# Patient Record
Sex: Female | Born: 1980 | Race: White | Hispanic: No | Marital: Single | Smoking: Former smoker
Health system: Southern US, Community
[De-identification: ages and names within clinical notes are randomized; demographics above are authoritative.]

## PROBLEM LIST (undated history)

## (undated) ENCOUNTER — Inpatient Hospital Stay: Admission: EM | Payer: Self-pay | Source: Home / Self Care

## (undated) DIAGNOSIS — F1910 Other psychoactive substance abuse, uncomplicated: Secondary | ICD-10-CM

## (undated) DIAGNOSIS — F419 Anxiety disorder, unspecified: Secondary | ICD-10-CM

## (undated) DIAGNOSIS — Z7289 Other problems related to lifestyle: Secondary | ICD-10-CM

## (undated) DIAGNOSIS — IMO0002 Reserved for concepts with insufficient information to code with codable children: Secondary | ICD-10-CM

## (undated) DIAGNOSIS — B009 Herpesviral infection, unspecified: Secondary | ICD-10-CM

## (undated) DIAGNOSIS — Z8639 Personal history of other endocrine, nutritional and metabolic disease: Secondary | ICD-10-CM

## (undated) DIAGNOSIS — Z21 Asymptomatic human immunodeficiency virus [HIV] infection status: Secondary | ICD-10-CM

## (undated) DIAGNOSIS — F319 Bipolar disorder, unspecified: Secondary | ICD-10-CM

## (undated) DIAGNOSIS — B20 Human immunodeficiency virus [HIV] disease: Secondary | ICD-10-CM

## (undated) DIAGNOSIS — M5412 Radiculopathy, cervical region: Secondary | ICD-10-CM

## (undated) HISTORY — DX: Radiculopathy, cervical region: M54.12

## (undated) HISTORY — DX: Personal history of other endocrine, nutritional and metabolic disease: Z86.39

## (undated) HISTORY — DX: Human immunodeficiency virus (HIV) disease: B20

## (undated) HISTORY — PX: WISDOM TOOTH EXTRACTION: SHX21

## (undated) HISTORY — PX: CHOLECYSTECTOMY: SHX55

## (undated) HISTORY — DX: Bipolar disorder, unspecified: F31.9

## (undated) HISTORY — DX: Other problems related to lifestyle: Z72.89

## (undated) HISTORY — DX: Other psychoactive substance abuse, uncomplicated: F19.10

## (undated) HISTORY — DX: Herpesviral infection, unspecified: B00.9

## (undated) HISTORY — DX: Anxiety disorder, unspecified: F41.9

## (undated) HISTORY — DX: Asymptomatic human immunodeficiency virus (hiv) infection status: Z21

## (undated) HISTORY — DX: Reserved for concepts with insufficient information to code with codable children: IMO0002

---
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
## 1998-01-11 ENCOUNTER — Emergency Department (HOSPITAL_COMMUNITY): Admission: EM | Admit: 1998-01-11 | Discharge: 1998-01-11 | Payer: Self-pay | Admitting: Emergency Medicine

## 1999-02-23 ENCOUNTER — Emergency Department (HOSPITAL_COMMUNITY): Admission: EM | Admit: 1999-02-23 | Discharge: 1999-02-23 | Payer: Self-pay | Admitting: Emergency Medicine

## 1999-10-11 ENCOUNTER — Encounter: Payer: Self-pay | Admitting: Emergency Medicine

## 1999-10-11 ENCOUNTER — Emergency Department (HOSPITAL_COMMUNITY): Admission: EM | Admit: 1999-10-11 | Discharge: 1999-10-11 | Payer: Self-pay | Admitting: Emergency Medicine

## 2000-02-09 ENCOUNTER — Emergency Department (HOSPITAL_COMMUNITY): Admission: EM | Admit: 2000-02-09 | Discharge: 2000-02-09 | Payer: Self-pay | Admitting: *Deleted

## 2000-06-12 ENCOUNTER — Emergency Department (HOSPITAL_COMMUNITY): Admission: EM | Admit: 2000-06-12 | Discharge: 2000-06-12 | Payer: Self-pay | Admitting: Emergency Medicine

## 2000-06-15 ENCOUNTER — Inpatient Hospital Stay (HOSPITAL_COMMUNITY): Admission: EM | Admit: 2000-06-15 | Discharge: 2000-06-17 | Payer: Self-pay | Admitting: Emergency Medicine

## 2000-07-31 ENCOUNTER — Encounter: Payer: Self-pay | Admitting: Emergency Medicine

## 2000-07-31 ENCOUNTER — Emergency Department (HOSPITAL_COMMUNITY): Admission: EM | Admit: 2000-07-31 | Discharge: 2000-08-01 | Payer: Self-pay

## 2000-09-29 ENCOUNTER — Emergency Department (HOSPITAL_COMMUNITY): Admission: EM | Admit: 2000-09-29 | Discharge: 2000-09-29 | Payer: Self-pay | Admitting: Emergency Medicine

## 2000-10-26 ENCOUNTER — Emergency Department (HOSPITAL_COMMUNITY): Admission: EM | Admit: 2000-10-26 | Discharge: 2000-10-26 | Payer: Self-pay | Admitting: Emergency Medicine

## 2001-06-08 ENCOUNTER — Emergency Department (HOSPITAL_COMMUNITY): Admission: EM | Admit: 2001-06-08 | Discharge: 2001-06-08 | Payer: Self-pay | Admitting: Emergency Medicine

## 2001-06-08 ENCOUNTER — Encounter: Payer: Self-pay | Admitting: Emergency Medicine

## 2002-08-14 ENCOUNTER — Emergency Department (HOSPITAL_COMMUNITY): Admission: EM | Admit: 2002-08-14 | Discharge: 2002-08-14 | Payer: Self-pay | Admitting: Emergency Medicine

## 2003-02-16 ENCOUNTER — Ambulatory Visit (HOSPITAL_COMMUNITY): Admission: RE | Admit: 2003-02-16 | Discharge: 2003-02-16 | Payer: Self-pay | Admitting: Family Medicine

## 2003-03-07 ENCOUNTER — Ambulatory Visit (HOSPITAL_COMMUNITY): Admission: RE | Admit: 2003-03-07 | Discharge: 2003-03-07 | Payer: Self-pay | Admitting: Family Medicine

## 2003-04-14 ENCOUNTER — Emergency Department (HOSPITAL_COMMUNITY): Admission: EM | Admit: 2003-04-14 | Discharge: 2003-04-14 | Payer: Self-pay | Admitting: Emergency Medicine

## 2003-05-11 ENCOUNTER — Ambulatory Visit (HOSPITAL_COMMUNITY): Admission: RE | Admit: 2003-05-11 | Discharge: 2003-05-11 | Payer: Self-pay | Admitting: Orthopedic Surgery

## 2003-08-21 ENCOUNTER — Ambulatory Visit (HOSPITAL_COMMUNITY): Admission: RE | Admit: 2003-08-21 | Discharge: 2003-08-21 | Payer: Self-pay | Admitting: Family Medicine

## 2003-09-07 ENCOUNTER — Encounter (INDEPENDENT_AMBULATORY_CARE_PROVIDER_SITE_OTHER): Payer: Self-pay | Admitting: *Deleted

## 2003-09-07 ENCOUNTER — Observation Stay (HOSPITAL_COMMUNITY): Admission: RE | Admit: 2003-09-07 | Discharge: 2003-09-08 | Payer: Self-pay | Admitting: Surgery

## 2003-11-07 ENCOUNTER — Ambulatory Visit: Payer: Self-pay | Admitting: Family Medicine

## 2004-05-02 ENCOUNTER — Other Ambulatory Visit: Admission: RE | Admit: 2004-05-02 | Discharge: 2004-05-02 | Payer: Self-pay | Admitting: Obstetrics and Gynecology

## 2004-06-06 ENCOUNTER — Other Ambulatory Visit: Admission: RE | Admit: 2004-06-06 | Discharge: 2004-06-06 | Payer: Self-pay | Admitting: Obstetrics and Gynecology

## 2004-06-06 HISTORY — PX: LEEP: SHX91

## 2004-09-19 ENCOUNTER — Other Ambulatory Visit: Admission: RE | Admit: 2004-09-19 | Discharge: 2004-09-19 | Payer: Self-pay | Admitting: Obstetrics and Gynecology

## 2004-10-19 ENCOUNTER — Emergency Department (HOSPITAL_COMMUNITY): Admission: EM | Admit: 2004-10-19 | Discharge: 2004-10-19 | Payer: Self-pay | Admitting: Emergency Medicine

## 2004-10-22 ENCOUNTER — Emergency Department (HOSPITAL_COMMUNITY): Admission: EM | Admit: 2004-10-22 | Discharge: 2004-10-22 | Payer: Self-pay | Admitting: Emergency Medicine

## 2004-11-08 ENCOUNTER — Emergency Department (HOSPITAL_COMMUNITY): Admission: EM | Admit: 2004-11-08 | Discharge: 2004-11-08 | Payer: Self-pay | Admitting: Emergency Medicine

## 2005-01-17 ENCOUNTER — Emergency Department (HOSPITAL_COMMUNITY): Admission: EM | Admit: 2005-01-17 | Discharge: 2005-01-17 | Payer: Self-pay | Admitting: Emergency Medicine

## 2005-02-15 ENCOUNTER — Emergency Department (HOSPITAL_COMMUNITY): Admission: EM | Admit: 2005-02-15 | Discharge: 2005-02-15 | Payer: Self-pay | Admitting: Emergency Medicine

## 2005-03-10 ENCOUNTER — Ambulatory Visit: Payer: Self-pay | Admitting: Physical Medicine & Rehabilitation

## 2005-03-10 ENCOUNTER — Encounter
Admission: RE | Admit: 2005-03-10 | Discharge: 2005-06-08 | Payer: Self-pay | Admitting: Physical Medicine & Rehabilitation

## 2005-04-04 ENCOUNTER — Other Ambulatory Visit: Admission: RE | Admit: 2005-04-04 | Discharge: 2005-04-04 | Payer: Self-pay | Admitting: Obstetrics and Gynecology

## 2005-04-14 ENCOUNTER — Encounter: Admission: RE | Admit: 2005-04-14 | Discharge: 2005-04-14 | Payer: Self-pay | Admitting: Obstetrics and Gynecology

## 2005-04-21 ENCOUNTER — Ambulatory Visit: Payer: Self-pay | Admitting: Physical Medicine & Rehabilitation

## 2005-05-23 ENCOUNTER — Emergency Department (HOSPITAL_COMMUNITY): Admission: EM | Admit: 2005-05-23 | Discharge: 2005-05-23 | Payer: Self-pay | Admitting: Emergency Medicine

## 2005-05-24 ENCOUNTER — Emergency Department (HOSPITAL_COMMUNITY): Admission: EM | Admit: 2005-05-24 | Discharge: 2005-05-24 | Payer: Self-pay | Admitting: Emergency Medicine

## 2005-05-28 ENCOUNTER — Ambulatory Visit: Payer: Self-pay | Admitting: *Deleted

## 2005-05-28 ENCOUNTER — Inpatient Hospital Stay (HOSPITAL_COMMUNITY): Admission: RE | Admit: 2005-05-28 | Discharge: 2005-05-30 | Payer: Self-pay | Admitting: *Deleted

## 2005-06-11 ENCOUNTER — Emergency Department (HOSPITAL_COMMUNITY): Admission: EM | Admit: 2005-06-11 | Discharge: 2005-06-11 | Payer: Self-pay | Admitting: Emergency Medicine

## 2005-06-11 ENCOUNTER — Encounter: Payer: Self-pay | Admitting: Emergency Medicine

## 2005-06-12 ENCOUNTER — Emergency Department (HOSPITAL_COMMUNITY): Admission: EM | Admit: 2005-06-12 | Discharge: 2005-06-12 | Payer: Self-pay | Admitting: Emergency Medicine

## 2005-06-23 ENCOUNTER — Ambulatory Visit: Payer: Self-pay | Admitting: Physical Medicine & Rehabilitation

## 2005-06-23 ENCOUNTER — Other Ambulatory Visit (HOSPITAL_COMMUNITY): Admission: RE | Admit: 2005-06-23 | Discharge: 2005-07-08 | Payer: Self-pay | Admitting: Psychiatry

## 2005-06-23 ENCOUNTER — Encounter
Admission: RE | Admit: 2005-06-23 | Discharge: 2005-09-21 | Payer: Self-pay | Admitting: Physical Medicine & Rehabilitation

## 2005-07-06 ENCOUNTER — Emergency Department (HOSPITAL_COMMUNITY): Admission: EM | Admit: 2005-07-06 | Discharge: 2005-07-06 | Payer: Self-pay | Admitting: Emergency Medicine

## 2005-07-16 ENCOUNTER — Ambulatory Visit: Payer: Self-pay | Admitting: Physical Medicine & Rehabilitation

## 2005-07-24 ENCOUNTER — Ambulatory Visit: Payer: Self-pay | Admitting: Physical Medicine & Rehabilitation

## 2005-09-29 ENCOUNTER — Encounter
Admission: RE | Admit: 2005-09-29 | Discharge: 2005-12-28 | Payer: Self-pay | Admitting: Physical Medicine & Rehabilitation

## 2005-10-06 ENCOUNTER — Other Ambulatory Visit: Admission: RE | Admit: 2005-10-06 | Discharge: 2005-10-06 | Payer: Self-pay | Admitting: Obstetrics and Gynecology

## 2005-10-20 ENCOUNTER — Ambulatory Visit: Payer: Self-pay | Admitting: Physical Medicine & Rehabilitation

## 2005-10-22 ENCOUNTER — Ambulatory Visit (HOSPITAL_COMMUNITY)
Admission: RE | Admit: 2005-10-22 | Discharge: 2005-10-22 | Payer: Self-pay | Admitting: Physical Medicine & Rehabilitation

## 2005-10-31 ENCOUNTER — Encounter: Payer: Self-pay | Admitting: Gastroenterology

## 2005-11-21 ENCOUNTER — Ambulatory Visit: Payer: Self-pay | Admitting: Endocrinology

## 2006-02-06 ENCOUNTER — Ambulatory Visit: Payer: Self-pay | Admitting: Endocrinology

## 2006-10-08 ENCOUNTER — Encounter: Payer: Self-pay | Admitting: Endocrinology

## 2006-10-08 DIAGNOSIS — D352 Benign neoplasm of pituitary gland: Secondary | ICD-10-CM | POA: Insufficient documentation

## 2006-10-08 DIAGNOSIS — E0590 Thyrotoxicosis, unspecified without thyrotoxic crisis or storm: Secondary | ICD-10-CM | POA: Insufficient documentation

## 2006-10-08 DIAGNOSIS — D353 Benign neoplasm of craniopharyngeal duct: Secondary | ICD-10-CM

## 2007-05-16 ENCOUNTER — Emergency Department (HOSPITAL_COMMUNITY): Admission: EM | Admit: 2007-05-16 | Discharge: 2007-05-17 | Payer: Self-pay | Admitting: Emergency Medicine

## 2007-05-24 ENCOUNTER — Inpatient Hospital Stay (HOSPITAL_COMMUNITY): Admission: EM | Admit: 2007-05-24 | Discharge: 2007-05-25 | Payer: Self-pay | Admitting: Emergency Medicine

## 2007-05-25 ENCOUNTER — Encounter: Payer: Self-pay | Admitting: Internal Medicine

## 2007-06-01 ENCOUNTER — Ambulatory Visit: Payer: Self-pay | Admitting: Endocrinology

## 2007-06-01 DIAGNOSIS — E221 Hyperprolactinemia: Secondary | ICD-10-CM

## 2008-01-11 ENCOUNTER — Emergency Department (HOSPITAL_COMMUNITY): Admission: EM | Admit: 2008-01-11 | Discharge: 2008-01-11 | Payer: Self-pay | Admitting: Emergency Medicine

## 2009-11-12 ENCOUNTER — Other Ambulatory Visit: Admission: RE | Admit: 2009-11-12 | Discharge: 2009-11-12 | Payer: Self-pay | Admitting: Family Medicine

## 2009-12-17 ENCOUNTER — Other Ambulatory Visit: Payer: Self-pay | Admitting: Emergency Medicine

## 2009-12-17 ENCOUNTER — Inpatient Hospital Stay (HOSPITAL_COMMUNITY): Admission: EM | Admit: 2009-12-17 | Discharge: 2009-12-20 | Payer: Self-pay | Admitting: Psychiatry

## 2009-12-17 DIAGNOSIS — F3112 Bipolar disorder, current episode manic without psychotic features, moderate: Secondary | ICD-10-CM

## 2010-03-03 ENCOUNTER — Encounter: Payer: Self-pay | Admitting: Internal Medicine

## 2010-04-23 LAB — TSH: TSH: 2.442 u[IU]/mL (ref 0.350–4.500)

## 2010-04-23 LAB — URINALYSIS, ROUTINE W REFLEX MICROSCOPIC
Glucose, UA: NEGATIVE mg/dL
Hgb urine dipstick: NEGATIVE
Ketones, ur: NEGATIVE mg/dL
Protein, ur: NEGATIVE mg/dL
pH: 7 (ref 5.0–8.0)

## 2010-04-24 LAB — COMPREHENSIVE METABOLIC PANEL
Albumin: 3.2 g/dL — ABNORMAL LOW (ref 3.5–5.2)
Alkaline Phosphatase: 57 U/L (ref 39–117)
BUN: 11 mg/dL (ref 6–23)
CO2: 26 mEq/L (ref 19–32)
Chloride: 104 mEq/L (ref 96–112)
Creatinine, Ser: 1.16 mg/dL (ref 0.4–1.2)
GFR calc non Af Amer: 54 mL/min — ABNORMAL LOW (ref >60)
Glucose, Bld: 103 mg/dL — ABNORMAL HIGH (ref 70–99)
Potassium: 4.2 mEq/L (ref 3.5–5.1)
Total Bilirubin: 0.4 mg/dL (ref 0.3–1.2)

## 2010-04-24 LAB — CBC
HCT: 33.5 % — ABNORMAL LOW (ref 36.0–46.0)
Hemoglobin: 11.1 g/dL — ABNORMAL LOW (ref 12.0–15.0)
MCH: 29.7 pg (ref 26.0–34.0)
MCV: 89.3 fL (ref 78.0–100.0)
Platelets: 350 10*3/uL (ref 150–400)
RBC: 3.75 MIL/uL — ABNORMAL LOW (ref 3.87–5.11)
WBC: 8.4 10*3/uL (ref 4.0–10.5)

## 2010-04-24 LAB — URINALYSIS, ROUTINE W REFLEX MICROSCOPIC
Hgb urine dipstick: NEGATIVE
Protein, ur: NEGATIVE mg/dL
Specific Gravity, Urine: 1.01 (ref 1.005–1.030)
Urobilinogen, UA: 0.2 mg/dL (ref 0.0–1.0)

## 2010-04-24 LAB — DIFFERENTIAL
Basophils Absolute: 0.1 10*3/uL (ref 0.0–0.1)
Basophils Relative: 1 % (ref 0–1)
Lymphocytes Relative: 33 % (ref 12–46)
Monocytes Absolute: 0.4 10*3/uL (ref 0.1–1.0)
Neutro Abs: 4.9 10*3/uL (ref 1.7–7.7)
Neutrophils Relative %: 59 % (ref 43–77)

## 2010-04-24 LAB — RAPID URINE DRUG SCREEN, HOSP PERFORMED
Amphetamines: NOT DETECTED
Barbiturates: NOT DETECTED
Opiates: NOT DETECTED

## 2010-06-25 NOTE — H&P (Signed)
NAME:  Yolanda Jones, Yolanda Jones             ACCOUNT NO.:  [id number]   MEDICAL RECORD NO.:  [id number]          PATIENT TYPE:  INP   LOCATION:  0108                         FACILITY:  [facility]   PHYSICIAN:  Michiel Cowboy, MDDATE OF BIRTH:  1980-04-20   DATE OF ADMISSION:  05/24/2007  DATE OF DISCHARGE:                              HISTORY & PHYSICAL   PRIMARY CARE PHYSICIAN:  Lia Hopping, M.D.   ENDOCRINOLOGIST:  Cleophas Dunker. Everardo All, M.D.   CHIEF COMPLAINT:  Lightheadedness and syncope.   HISTORY OF PRESENT ILLNESS:  The patient is a 30 year old female with  history of fibromyalgia, bipolar disorder, chronic back pain, and  pituitary adenoma treated by Dr. Everardo All for which she is on  bromocriptine.  The patient reports for the past two weeks, she has been  progressively more and more lightheaded, especially when she stands up.  She also endorses increased urination with increased volume of urine  produced.  Also she feels that she has gained some extra weight.  The  patient was seen for this one week ago in the emergency department and  was given IV fluids and sent home and continues to have similar symptoms  which she feels are currently worse.  She feels that she is lightheaded  every time she stands up.  Otherwise denies chest pain or shortness of  breath.  She denies fevers or chills.  No lower extremity swelling.   REVIEW OF SYSTEMS:  Otherwise negative except for as per HPI.  The  patient endorses daily headaches, but denies double vision.   PAST MEDICAL HISTORY:  Significant for pituitary adenoma, bipolar  disorder, chronic back pain, chronic insomnia, ADHD.  Asthma.   SOCIAL HISTORY:  The patient is disabled.  Denies drug abuse, but in  records may have had prescription narcotic abuse in the past.  Currently  smokes and drinks occasionally.   ALLERGIES:  DAYPRO, MOBIC.   MEDICATIONS:  The patient is unclear about her dosages and will have her  friend bring her medications  tomorrow, but to the best of her knowledge  she takes:  1. Clonazepam which she is prescribed to do 1 mg three times a day,      but she only takes it occasionally when she feels she needs it.  2. Lamictal 200 mg p.o. once a day.  3. Zyprexa once a day.  The patient is unsure of dose.  4. Seroquel 200 mg p.o. once a day.  5. Bromocriptine the patient is unsure of dose once a day, but per      records it is 2.5 mg p.o. once a day.  6. Adoral XR 20 mg p.o. once a day.  The patient does not take this      regularly.  She takes it whenever she feels she needs it.   FAMILY HISTORY:  The patient is adopted.   PHYSICAL EXAMINATION:  VITAL SIGNS:  Temperature 98.3, respirations 18,  heart rate 107, blood pressure 114/77, saturating 99% on room air.  GENERAL:  The patient is obese, but in no acute distress.  She is laying  down comfortably  on a stretcher.  HEENT:  Moist mucous membranes.  HEART:  Regular rate and rhythm with no murmurs, rubs, or gallops.  LUNGS:  Clear to auscultation bilaterally.  ABDOMEN:  Obese, nondistended, and nontender.  EXTREMITIES:  Lower extremities without edema.  SKIN:  Significant for multiple old and new ulceration scars over upper  extremities.  NEUROLOGY:  Intact.   STUDIES:  White blood cell count 8.7, hemoglobin 12.2, platelets 358.  Sodium 140, potassium 4.2, creatinine 0.84.  LFTs within normal limits.  Urine pregnancy test is negative.  UA shows small leukocytes, 3 white  blood cells, and no bacteria.   MRI; the patient has not had MRI for about 2 years.  The last one was  obtained in March 2007 which showed a small pituitary adenoma 3 x 4 mm  and pituitary gland overall upper limits of normal between 7-8 mm.  Otherwise normal brain.  Again this scan was done in 2007.   ASSESSMENT:  This is a 30 year old female with past medical history of  pituitary adenoma who comes in with frequent lightheadedness.  1. Lightheadedness.  We will obtain  orthostatics.  We will give      hydration overnight and see if her orthostasis improves.  We will      obtain cortisol level and TSH level.  Given history of      prolactinoma, we will obtain a prolactin level and repeat MRI since      the patient is due for that.  Once the patient is hemodynamically      stable and orthostasis has resolved, we will have her follow up      with her endocrinologist sooner or later.  We will also, given the      frequency of urination, check urine osmolality and do strict I&Os      to keep an eye on amount of patient intake.  2. History of bipolar disorder.  We will continue her home medicines.      Right now we will continue presumed doses and we will have the      patient's friend bring the medicines in the morning and adjust her      medication as per her prescribed doses.  3. Prophylaxis.  SCDs and good diet.      Michiel Cowboy, MD  Electronically Signed     AVD/MEDQ  D:  05/25/2007  T:  05/25/2007  Job:  [id number]   cc:   Lia Hopping  Fax: [phone]   Sean A. Everardo All, MD  [address]  [place]  [place] [address]

## 2010-06-28 NOTE — Op Note (Signed)
NAME:  Yolanda Jones, Yolanda Jones                       ACCOUNT NO.:  [id number]   MEDICAL RECORD NO.:  [id number]                   PATIENT TYPE:  AMB   LOCATION:  DAY                                  FACILITY:  [facility]   PHYSICIAN:  Currie Paris, M.D.           DATE OF BIRTH:  07-01-1980   DATE OF PROCEDURE:  09/07/2003  DATE OF DISCHARGE:                                 OPERATIVE REPORT   CCS#:  [id number]   PREOPERATIVE DIAGNOSES:  Chronic calculous cholecystitis.   POSTOPERATIVE DIAGNOSES:  Chronic calculous cholecystitis.   OPERATION:  Laparoscopic cholecystectomy with operative cholangiogram.   SURGEON:  Currie Paris, M.D.   ASSISTANT:  Angelia Mould. Derrell Lolling, M.D.   ANESTHESIA:  General endotracheal.   CLINICAL COURSE:  This patient is a 30 year old with biliary type symptoms  and a finding of gallstones.   DESCRIPTION OF PROCEDURE:  The patient was seen in the holding area and had  no further questions. She was taken to the operating room and after  satisfactory general endotracheal anesthesia had been obtained, the abdomen  was prepped and draped. 0.25% plain Marcaine was used for each incision. The  umbilical incision was made, the fascia opened, and the peritoneal cavity  entered under direct vision.  A pursestring was placed, the Hasson  introduced and the abdomen insufflated to 15.   With the camera in, everything appeared unremarkable and the patient was  placed in reverse Trendelenburg position and tilted to the left. A 10/11  trocar was placed in the epigastrium and two 5 mm laterally on the right  side.   The gallbladder was retracted over the liver and the area of the triangle of  Calot dissected out with the peritoneum being opened. The cystic artery was  densely attached to the cystic duct, was dissected off, clipped and divided.  The cystic duct was then dissected out for a fairly good length and clipped  at the junction with the gallbladder.  It was opened  and a Cook catheter  introduced and operative cholangiography done which appeared to be normal.   The cystic duct catheter was removed and three clips placed on the stay side  of  the cystic duct. The gallbladder was then removed from below to above.  The posterior branch of the artery was clipped as well as another little  vessel higher up posteriorly as the gallbladder was removed. Once it was  disconnected, it was put in a bag. We irrigated and made sure everything was  dry and then brought the gallbladder out the umbilical port. The port was  occluded for a few minutes while we did a final irrigation and checked for  hemostasis and again there was no blood or bile  leak noted. The bladder ports were removed. The umbilical port site was  closed with a pursestring. The abdomen was deflated through the epigastric  port.  The skin was closed with 4-0 Monocryl  subcuticular and Dermabond.   The patient tolerated the procedure well. There were no operative  complications.  All counts were correct.                                               Currie Paris, M.D.    CJS/MEDQ  D:  09/07/2003  T:  09/07/2003  Job:  [id number]   cc:   Maurice March, M.D.  [address] [place]  [place] [address]  Fax: [phone]

## 2010-06-28 NOTE — Assessment & Plan Note (Signed)
Yolanda Jones is back regarding her fibromyalgia and low back pain.  We got an  MRI of her lumbar spine which was done on October 22, 2005.  There was  residual disc desiccation at L5-S1 with a minimal posterior annular tear.  There appeared to be no obvious change from the last exam in March 2005.  No  obvious facet arthropathy was seen.  I reviewed these films by hand.  The  patient complains of similar pain today in the low back with some radiation  up to the neck.  She rates her pain as a 7 to 8 out of 10, described as  sharp, burning, intermittent, dull, and stabbing.  She uses Norco 10/325, 1-  2 q.6h. p.r.n. with some relief.  She states the Tresa Garter is not as helpful as  the Flexeril was previously.  She has not begun Lyrica due to insurance  coverage issues.  She has been using ibuprofen with some benefit.  She  states the pain is most prominent when she sits, however, upon further  questioning, she states that generally the pain is mostly an issue when she  rises to a standing position after sitting for awhile.  She will frequently  sit with her legs crossed and hips flexed while she is at the computer.  She  rarely stretches before getting up.  Walking and standing also exacerbates  her pain.  She states she has some discomfort with bending, as well.  There  is no radiation of the pain to her legs.  The pain seems to be the most  prominent at the base of her spine in a central distribution.  The pain  interferes with general activity, relation to others, and enjoyment of life  on a moderate level.   On review of systems, the patient reports tingling, spasms, dizziness,  occasional anxiety with depression, weight gain.  Other pertinent positives  as listed above, full review is in the health and history section.   SOCIAL HISTORY:  The patient is single and living with a roommate.   PHYSICAL EXAMINATION:  VITAL SIGNS:  Blood pressure 102/58, pulse 111, respiratory rate 16, she is  saturating at 99% on room air.  GENERAL:  The patient is pleasant in no apparent distress.  She appears to  have gained some weight.  HEART:  Regular rhythm and tachycardic.  LUNGS:  Clear.  ABDOMEN:  Soft, nontender.  Examination of the low back is notable for pain at the L5-S1 level along  with palpation of the facets and spinous process.  She is able to flex  almost touching her toes today with some discomfort.  She had much more  discomfort with extension and with some maneuvers to either side.  She had  pain at the L5-S1 level as well as the L4-L5 level with extension.  We  performed straight leg-testing which was negative.  Luisa Hart testing was  negative except for pain at the knees.  Seated slump test was negative  except for hamstring stretch tightness.  The patient sitting prone with the  back extended caused discomfort in the lumbar spine today.  Compression test  was negative on either side.  Motor function was 5/5 and sensory exam was  normal.  No focal trigger points were palpated today.   ASSESSMENT:  1. Fibromyalgia syndrome.  2. Questionable bilateral sacroiliac joint arthropathy.  3. Lumbar facet arthropathy despite lack of obvious appearance on her MRI.  4. Myofascial pain.  5. Insomnia.  6. Post  traumatic stress disorder/borderline personality.  7. Chronic fatigue.   PLAN:  1. Will set the patient up for lumbar facet blocks at L4-L5 and L5-S1.  I      think the L5-S1 level is the most significant contributor to her pain.  2. Change her to Flexeril 10 mg 1/2 to 1 q.8h. p.r.n.  3. Refill Norco 10/325, 1-2 q.6h. p.r.n., #140.  4. We stressed heavily the importance of regular stretching, particularly      with flexion exercises, every morning as well as while she is sitting      for prolonged periods of time.  She needs to break up her sitting into      20-30 minutes increments.  She needs to work on aerobic  activity such      as walking, bicycling, possibly water  therapies, etc.  5. Consider Lyrica trial.  6. I will see her back after facet blocks.      Ranelle Oyster, M.D.  Electronically Signed     ZTS/MedQ  D:  11/17/2005 14:55:19  T:  11/18/2005 23:12:38  Job #:  [id number]   cc:   Silas Sacramento, M.D.
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
## 2010-06-28 NOTE — Procedures (Signed)
NAME:  Yolanda Jones, Yolanda Jones             ACCOUNT NO.:  [id number]   MEDICAL RECORD NO.:  [id number]            PATIENT TYPE:   LOCATION:                                 FACILITY:   PHYSICIAN:  Erick Colace, M.D.DATE OF BIRTH:  [birth year]   DATE OF PROCEDURE:  DATE OF DISCHARGE:                                 OPERATIVE REPORT   OPERATION/PROCEDURE:  Bilateral sacroiliac joint injection under  fluoroscopic guidance.   DESCRIPTION OF PROCEDURE:  Informed consent was obtained after describing  the risks and benefits of the procedure to the patient.  These include  bleeding, bruising, infection, lower extremity weakness or numbness.  The  patient elects to proceed.   The patient was placed prone on the fluoroscopy table.  Betadine prepped and  sterilely draped.  A 25-gauge, 1-and-1/2-inch needle was used to anesthetize  the skin and subcutaneous tissues with 1% lidocaine x 2 mL at each site.  Then a 25-gauge, 3-inch spinal needle was inserted first at the left SI  joint under fluoroscopic guidance with AP and lateral imaging utilized.  Omnipaque 180 demonstrated good joint arthrogram, and a solution containing  1 mL of 40 mg/mL Depo-Medrol plus 1 mL of 2% MPF lidocaine was injected.  Next, the right sacroiliac joint was imaged with 25-gauge spinal needle  inserted into the right SI joint under fluoroscopic guidance.  Omnipaque 180  demonstrated joint arthrogram as well as a capsular leak inferiorly, and  then 1 mL of the Depo-Medrol/lidocaine solution was injected.  The patient  tolerated the procedure well.   Post-procedure instructions given.  The patient to follow up with Dr.  Riley Kill.      Erick Colace, M.D.  Electronically Signed     AEK/MEDQ  D:  07/17/2005 16:57:15  T:  07/18/2005 11:36:27  Job:  [id number]   cc:   Ranelle Oyster, M.D.  Fax: [phone]

## 2010-06-28 NOTE — Consult Note (Signed)
[facility]                          ENDOCRINOLOGY CONSULTATION   NAME:JONESJennifier, [name] Jones                    MRN:          [id number]  DATE:02/06/2006                            DOB:          05/16/1980    REFERRING PHYSICIAN:  Jonita Jones, M.D.   HISTORY OF PRESENT ILLNESS:  A 30 year old woman who went to see Dr.  Perrin Jones because she did not feel well. She had several months of slight  palpitations in the chest with some associated weight gain and twitching  of the left eye.   PAST MEDICAL HISTORY:  She has a history of a small prolactin producing  pituitary adenoma. She takes Parlodel 2.5 mg q.h.s. for this, and her  menstrual periods have returned.   SOCIAL HISTORY:  She is disabled, she is single.   FAMILY HISTORY:  She is adopted.   REVIEW OF SYSTEMS:  Denies tremor and palpitations.   PHYSICAL EXAMINATION:  VITAL SIGNS:  Blood pressure 105/73, heart rate  96, temperature is 97.4, the weight is 185.  GENERAL:  Obese, no distress.  SKIN:  Normal texture and temperature.  HEENT:  No proptosis, no periorbital swelling.  NECK:  I do not appreciate a goiter.  CHEST:  Clear to auscultation. No respiratory distress.  CARDIOVASCULAR:  No JVD, no edema. Regular rate and rhythm, no murmur.  MUSCULOSKELETAL:  Muscle bulk and strength normal in Jones fours.  NEUROLOGIC:  Alert and oriented, does not appear anxious nor depressed  and there is no tremor.   LABORATORY DATA:  Forwarded by Dr. Perrin Jones:  On January 26, 2006, TSH  0.24. FTI is normal at 1.4. It should be noted that her TSH was normal  at 2.71 with Dr. Su Jones on October 06, 2005.   IMPRESSION:  1. Mild hyperthyroidism of uncertain etiology. It appears to have      developed over the past few months. Subacute thyroiditis is a      possible cause of this.  2. Small prolactin-producing pituitary adenoma for which she is      clinically better on Parlodel.  3. Her weight gain may be due to  her Seroquel.   PLAN:  1. We discussed hyperthyroidism, differential diagnoses of its causes,      and its risks and I gave her a brochure.  2. Recheck thyroid function studies in 2 weeks.  3. Check prolactin then also.  4. Having 1 more thyroid function study reading may help to determine      its etiology.  5. When her prolactin normalizes, she will due for a repeat MRI to      confirm resolution of the small adenoma she had.     Yolanda A. Everardo All, MD  Electronically Signed    SAE/MedQ  DD: 02/08/2006  DT: 02/09/2006  Job #: [id number]   cc:   Yolanda Jones, M.D.  Yolanda Jones, M.D.  Yolanda Jones, M.D.

## 2010-10-22 ENCOUNTER — Other Ambulatory Visit (HOSPITAL_COMMUNITY): Payer: Self-pay | Admitting: Family Medicine

## 2010-10-22 DIAGNOSIS — M549 Dorsalgia, unspecified: Secondary | ICD-10-CM

## 2010-10-29 ENCOUNTER — Ambulatory Visit (HOSPITAL_COMMUNITY)
Admission: RE | Admit: 2010-10-29 | Discharge: 2010-10-29 | Disposition: A | Discharge status: Home / Self Care | Payer: Medicare Other | Source: Ambulatory Visit | Attending: Family Medicine | Admitting: Family Medicine

## 2010-10-29 DIAGNOSIS — M5126 Other intervertebral disc displacement, lumbar region: Secondary | ICD-10-CM | POA: Insufficient documentation

## 2010-10-29 DIAGNOSIS — M545 Low back pain, unspecified: Secondary | ICD-10-CM | POA: Insufficient documentation

## 2010-10-29 DIAGNOSIS — M549 Dorsalgia, unspecified: Secondary | ICD-10-CM

## 2010-11-05 LAB — BASIC METABOLIC PANEL
Chloride: 108
GFR calc non Af Amer: >60
Glucose, Bld: 101 — ABNORMAL HIGH
Potassium: 3.6
Sodium: 139

## 2010-11-05 LAB — DIFFERENTIAL
Basophils Absolute: 0
Basophils Relative: 1
Eosinophils Absolute: 0
Eosinophils Relative: 0
Lymphocytes Relative: 38
Lymphs Abs: 3.2
Monocytes Absolute: 0.4
Monocytes Relative: 5
Monocytes Relative: 6
Neutrophils Relative %: 55

## 2010-11-05 LAB — URINALYSIS, ROUTINE W REFLEX MICROSCOPIC
Bilirubin Urine: NEGATIVE
Nitrite: NEGATIVE
Specific Gravity, Urine: 1.006
pH: 6

## 2010-11-05 LAB — COMPREHENSIVE METABOLIC PANEL
AST: 19
Albumin: 3.3 — ABNORMAL LOW
Alkaline Phosphatase: 64
Chloride: 107
GFR calc Af Amer: >60
Potassium: 4.2
Sodium: 140
Total Bilirubin: 0.6
Total Protein: 6.4

## 2010-11-05 LAB — CBC
HCT: 38.4
Hemoglobin: 12.1
MCV: 88.8
Platelets: 358
RDW: 15.2
WBC: 8.3
WBC: 8.7

## 2010-11-05 LAB — PROLACTIN: Prolactin: 16.4

## 2010-11-05 LAB — PROTIME-INR
INR: 0.8
Prothrombin Time: 11.7

## 2010-11-05 LAB — FOLLICLE STIMULATING HORMONE: FSH: 5.6

## 2010-11-05 LAB — TSH: TSH: 1.355

## 2010-11-05 LAB — CK: Total CK: 69

## 2010-11-05 LAB — OSMOLALITY, URINE: Osmolality, Ur: 390

## 2010-11-05 LAB — HEMOGLOBIN A1C: Mean Plasma Glucose: 108

## 2010-11-05 LAB — URINE MICROSCOPIC-ADD ON

## 2010-11-05 LAB — PREGNANCY, URINE: Preg Test, Ur: NEGATIVE

## 2010-12-09 ENCOUNTER — Ambulatory Visit (INDEPENDENT_AMBULATORY_CARE_PROVIDER_SITE_OTHER): Payer: Medicare Other

## 2010-12-09 DIAGNOSIS — Z79891 Long term (current) use of opiate analgesic: Secondary | ICD-10-CM

## 2010-12-09 DIAGNOSIS — F3132 Bipolar disorder, current episode depressed, moderate: Secondary | ICD-10-CM

## 2010-12-09 DIAGNOSIS — F4310 Post-traumatic stress disorder, unspecified: Secondary | ICD-10-CM

## 2010-12-09 DIAGNOSIS — Z23 Encounter for immunization: Secondary | ICD-10-CM

## 2010-12-09 DIAGNOSIS — F411 Generalized anxiety disorder: Secondary | ICD-10-CM

## 2010-12-09 DIAGNOSIS — B009 Herpesviral infection, unspecified: Secondary | ICD-10-CM

## 2010-12-09 DIAGNOSIS — B20 Human immunodeficiency virus [HIV] disease: Secondary | ICD-10-CM

## 2010-12-09 DIAGNOSIS — Z113 Encounter for screening for infections with a predominantly sexual mode of transmission: Secondary | ICD-10-CM

## 2010-12-09 DIAGNOSIS — F609 Personality disorder, unspecified: Secondary | ICD-10-CM

## 2010-12-09 DIAGNOSIS — Z114 Encounter for screening for human immunodeficiency virus [HIV]: Secondary | ICD-10-CM

## 2010-12-09 DIAGNOSIS — Z79899 Other long term (current) drug therapy: Secondary | ICD-10-CM

## 2010-12-09 LAB — CBC WITH DIFFERENTIAL/PLATELET
Basophils Absolute: 0 10*3/uL (ref 0.0–0.1)
Basophils Relative: 0 % (ref 0–1)
HCT: 41.4 % (ref 36.0–46.0)
Hemoglobin: 13.6 g/dL (ref 12.0–15.0)
Lymphocytes Relative: 42 % (ref 12–46)
MCHC: 32.9 g/dL (ref 30.0–36.0)
Monocytes Absolute: 0.4 10*3/uL (ref 0.1–1.0)
Neutro Abs: 2.5 10*3/uL (ref 1.7–7.7)
Neutrophils Relative %: 44 % (ref 43–77)
RDW: 13.4 % (ref 11.5–15.5)
WBC: 5.8 10*3/uL (ref 4.0–10.5)

## 2010-12-09 LAB — URINALYSIS
Bilirubin Urine: NEGATIVE
Nitrite: NEGATIVE
Protein, ur: NEGATIVE mg/dL
Specific Gravity, Urine: 1.013 (ref 1.005–1.030)
Urobilinogen, UA: 0.2 mg/dL (ref 0.0–1.0)

## 2010-12-09 LAB — LIPID PANEL
Cholesterol: 181 mg/dL (ref 0–200)
Triglycerides: 56 mg/dL (ref <150)
VLDL: 11 mg/dL (ref 0–40)

## 2010-12-09 LAB — RPR

## 2010-12-10 LAB — GC/CHLAMYDIA PROBE AMP, URINE: Chlamydia, Swab/Urine, PCR: NEGATIVE

## 2010-12-10 LAB — COMPLETE METABOLIC PANEL WITH GFR
ALT: 12 U/L (ref 0–35)
AST: 19 U/L (ref 0–37)
Albumin: 4.2 g/dL (ref 3.5–5.2)
Alkaline Phosphatase: 53 U/L (ref 39–117)
BUN: 13 mg/dL (ref 6–23)
Calcium: 9.9 mg/dL (ref 8.4–10.5)
Chloride: 100 mEq/L (ref 96–112)
Potassium: 3.9 mEq/L (ref 3.5–5.3)
Sodium: 139 mEq/L (ref 135–145)
Total Protein: 7.5 g/dL (ref 6.0–8.3)

## 2010-12-10 LAB — HEPATITIS B CORE ANTIBODY, TOTAL: Hep B Core Total Ab: NEGATIVE

## 2010-12-10 LAB — HEPATITIS A ANTIBODY, TOTAL: Hep A Total Ab: NEGATIVE

## 2010-12-10 LAB — HEPATITIS C ANTIBODY: HCV Ab: NEGATIVE

## 2010-12-11 LAB — HIV-1 RNA ULTRAQUANT REFLEX TO GENTYP+: HIV-1 RNA Quant, Log: 4.29 {Log} — ABNORMAL HIGH (ref <1.30)

## 2010-12-12 LAB — HIV 1/2 CONFIRMATION
HIV-1 antibody: POSITIVE — AB
HIV-2 Ab: NEGATIVE

## 2010-12-13 DIAGNOSIS — F609 Personality disorder, unspecified: Secondary | ICD-10-CM | POA: Insufficient documentation

## 2010-12-13 DIAGNOSIS — B009 Herpesviral infection, unspecified: Secondary | ICD-10-CM | POA: Insufficient documentation

## 2010-12-13 DIAGNOSIS — B20 Human immunodeficiency virus [HIV] disease: Secondary | ICD-10-CM | POA: Insufficient documentation

## 2010-12-13 DIAGNOSIS — F3132 Bipolar disorder, current episode depressed, moderate: Secondary | ICD-10-CM | POA: Insufficient documentation

## 2010-12-13 DIAGNOSIS — F4310 Post-traumatic stress disorder, unspecified: Secondary | ICD-10-CM | POA: Insufficient documentation

## 2010-12-13 LAB — TB SKIN TEST
Induration: 0
TB Skin Test: NEGATIVE mm
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
## 2010-12-17 ENCOUNTER — Telehealth: Payer: Self-pay | Admitting: *Deleted

## 2010-12-17 LAB — HIV-1 GENOTYPR PLUS

## 2010-12-17 NOTE — Telephone Encounter (Signed)
Avelino Leeds from the health dept called to find out when pt's appt will be & her viral load & cd4 counts

## 2010-12-23 ENCOUNTER — Telehealth: Payer: Self-pay | Admitting: *Deleted

## 2010-12-23 ENCOUNTER — Ambulatory Visit (INDEPENDENT_AMBULATORY_CARE_PROVIDER_SITE_OTHER): Payer: Medicare Other | Admitting: Internal Medicine

## 2010-12-23 ENCOUNTER — Encounter: Payer: Self-pay | Admitting: Internal Medicine

## 2010-12-23 VITALS — BP 109/76 | HR 118 | Temp 98.6°F | Ht 60.0 in | Wt 155.0 lb

## 2010-12-23 DIAGNOSIS — B20 Human immunodeficiency virus [HIV] disease: Secondary | ICD-10-CM

## 2010-12-23 MED ORDER — ELVITEG-COBIC-EMTRICIT-TENOFDF 150-150-200-300 MG PO TABS: 1.0000 | ORAL_TABLET | Freq: Every day | ORAL | Status: DC

## 2010-12-23 NOTE — Telephone Encounter (Signed)
Called and left message for patient to return my call.  Stribild is not covered by her insurance and Dr. Luciana Axe wants to know if she is interested in trying Complera which she needs to take with food and can not take any antacids with this drug. Wendall Mola CMA
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
## 2010-12-23 NOTE — Assessment & Plan Note (Signed)
At this time the patient does have a CD4 count 980 and detectable viral load of 19,000. I have discussed treatment options with the patient and timing of treatment and do feel that despite no obvious indication for starting treatment I also do not find any indication to withhold treatment. She does state that she is ready to take treatment and she does take her other medications daily and does not miss doses. The treatment options I did discuss once daily treatment options which include the newer medications, however unfortunately Stribild does not get covered by her insurance. Therefore other options are Complera or boosted Prezista and Truvada. She has used PPIs and asked blockers in the past and I have discussed with her the inability to take those with Complera and so she is going to decide between the Complera and a boosted Prezista with Truvada. I will of course avoid Atripla with her psychiatric treatment history. I do no feel that she is ready for treatment and therefore will be prescribed once she has decided to. I did discuss using condoms for all sexual activity and that another way to avoid transmission to some degree is treatment and suppression of virus. She does have an IUD for birth control. She does get her Pap smear from her gynecologist. She will then have her viral load rechecked a month after starting medications and we'll see her back at that time.

## 2010-12-23 NOTE — Patient Instructions (Signed)
Return for labs in 4 weeks and appt in 6 weeks

## 2010-12-25 ENCOUNTER — Telehealth: Payer: Self-pay | Admitting: Internal Medicine

## 2010-12-25 NOTE — Telephone Encounter (Signed)
Patient called back to let us know she has been approved for Stribild.  She will call to make an appointment as soon as she receives her paperwork from [place].

## 2010-12-25 NOTE — Telephone Encounter (Signed)
Received call from [employer] Revonda Standard said she could not call patient about permission to take application over the phone.  Called Ms Liera, gave her Allison's name and number at [employer].  She is going to call her today.

## 2010-12-25 NOTE — Telephone Encounter (Signed)
Patient's Stribild was approved . Wendall Mola CMA

## 2010-12-26 ENCOUNTER — Other Ambulatory Visit: Payer: Self-pay | Admitting: *Deleted

## 2010-12-26 NOTE — Telephone Encounter (Signed)
There was a message on voice mail asking for the code so she could get the Stribild. I called her. She already had the code & is to get the Stribild today

## 2011-01-06 ENCOUNTER — Telehealth: Payer: Self-pay | Admitting: Internal Medicine

## 2011-01-06 NOTE — Telephone Encounter (Signed)
Patient called.  She will be here Wednesday 11-28 at 2:00pm to fill out her [facility] [employer] application.

## 2011-01-08 ENCOUNTER — Other Ambulatory Visit: Payer: Self-pay | Admitting: *Deleted

## 2011-01-08 DIAGNOSIS — B20 Human immunodeficiency virus [HIV] disease: Secondary | ICD-10-CM

## 2011-01-08 MED ORDER — ELVITEG-COBIC-EMTRICIT-TENOFDF 150-150-200-300 MG PO TABS: 1.0000 | ORAL_TABLET | Freq: Every day | ORAL | Status: DC

## 2011-01-13 ENCOUNTER — Telehealth: Payer: Self-pay | Admitting: Internal Medicine

## 2011-01-13 NOTE — Telephone Encounter (Signed)
Faxed and mailed application to [employer] for Stribild today.

## 2011-01-14 ENCOUNTER — Telehealth: Payer: Self-pay | Admitting: *Deleted

## 2011-01-14 NOTE — Telephone Encounter (Signed)
States she has a cold & is taking a OTC . Wanted to make sure the ingredients were ok with all the meds she is taking. Urged her to call her pharmacist & ask. She uses the same store so he/she will be able to tell her if there are any interactions. She agreed with this plan. States the OTC is making her feel better

## 2011-01-16 ENCOUNTER — Telehealth: Payer: Self-pay | Admitting: Internal Medicine

## 2011-01-16 NOTE — Telephone Encounter (Signed)
Received fax from [employer].  Page 3 of application did not go through.  Faxed back to them with page 1 and 3.

## 2011-01-20 ENCOUNTER — Other Ambulatory Visit: Payer: Medicare Other

## 2011-01-20 ENCOUNTER — Other Ambulatory Visit: Payer: Self-pay | Admitting: Internal Medicine

## 2011-01-20 DIAGNOSIS — B20 Human immunodeficiency virus [HIV] disease: Secondary | ICD-10-CM

## 2011-01-21 LAB — COMPLETE METABOLIC PANEL WITH GFR
ALT: 12 U/L (ref 0–35)
AST: 20 U/L (ref 0–37)
BUN: 13 mg/dL (ref 6–23)
CO2: 27 mEq/L (ref 19–32)
Calcium: 9.2 mg/dL (ref 8.4–10.5)
Chloride: 101 mEq/L (ref 96–112)
Creat: 1.07 mg/dL (ref 0.50–1.10)
GFR, Est African American: 85 mL/min
Total Bilirubin: 0.2 mg/dL — ABNORMAL LOW (ref 0.3–1.2)

## 2011-01-21 LAB — T-HELPER CELL (CD4) - (RCID CLINIC ONLY)
CD4 % Helper T Cell: 45 % (ref 33–55)
CD4 T Cell Abs: 970 uL (ref 400–2700)

## 2011-01-21 LAB — CBC WITH DIFFERENTIAL/PLATELET
Hemoglobin: 13.3 g/dL (ref 12.0–15.0)
Lymphocytes Relative: 32 % (ref 12–46)
Lymphs Abs: 2 10*3/uL (ref 0.7–4.0)
Monocytes Relative: 6 % (ref 3–12)
Neutrophils Relative %: 55 % (ref 43–77)
Platelets: 258 10*3/uL (ref 150–400)
RBC: 4.38 MIL/uL (ref 3.87–5.11)
WBC: 6.1 10*3/uL (ref 4.0–10.5)

## 2011-01-22 ENCOUNTER — Telehealth: Payer: Self-pay | Admitting: Internal Medicine

## 2011-01-22 LAB — HIV-1 RNA QUANT-NO REFLEX-BLD
HIV 1 RNA Quant: 27 copies/mL — ABNORMAL HIGH (ref <20)
HIV-1 RNA Quant, Log: 1.43 {Log} — ABNORMAL HIGH (ref <1.30)

## 2011-01-22 NOTE — Telephone Encounter (Signed)
Called [employer] concerning denial letter received from them.  Yolanda Jones is covered for the rest of 2012, but we will need to send a letter of medical necessity to Medicare for 2013.  Called [employer] to find out what the process is.  Need to have a letter of medical necessity from her doctor.  Can call back after 02-11-11 to get the process started.  She was sent a 90 day supply to get her through 2013. I will call Braileigh and let her know.

## 2011-01-30 ENCOUNTER — Encounter: Payer: Self-pay | Admitting: Licensed Clinical Social Worker

## 2011-01-30 ENCOUNTER — Encounter: Payer: Self-pay | Admitting: Internal Medicine

## 2011-01-30 ENCOUNTER — Ambulatory Visit (INDEPENDENT_AMBULATORY_CARE_PROVIDER_SITE_OTHER): Payer: Medicare Other | Admitting: Internal Medicine

## 2011-01-30 VITALS — BP 115/88 | HR 91 | Temp 98.0°F | Ht 60.0 in | Wt 159.5 lb

## 2011-01-30 DIAGNOSIS — B20 Human immunodeficiency virus [HIV] disease: Secondary | ICD-10-CM

## 2011-01-30 NOTE — Patient Instructions (Signed)
Follow up in 4 months 

## 2011-01-31 ENCOUNTER — Encounter: Payer: Self-pay | Admitting: Internal Medicine

## 2011-01-31 NOTE — Progress Notes (Signed)
  Subjective:    Patient ID: Yolanda Jones, female    DOB: 06/27/1990, 30 y.o.   MRN: [id number]  HPI 30 yo recently diagnosed with HIV and started on Stribild here for follow up.  She Has tolerated the medicine well and no issues.  She endorses 100% compliance and tolerance of the medication.  No recent hospitalizations.  She continues to get her gyn care by her gynecologist.      Review of Systems  Constitutional: Negative for fever, chills, appetite change, fatigue and unexpected weight change.  HENT: Negative for trouble swallowing.   Cardiovascular: Negative for leg swelling.  Gastrointestinal: Negative for abdominal pain and diarrhea.  Genitourinary: Negative for genital sores, menstrual problem and pelvic pain.  Musculoskeletal: Negative for myalgias and arthralgias.  Skin: Negative for rash.  Neurological: Negative for dizziness and headaches.  Hematological: Negative for adenopathy.  Psychiatric/Behavioral: Negative for behavioral problems, dysphoric mood and decreased concentration. The patient is not nervous/anxious.        Objective:   Physical Exam  Constitutional: She is oriented to person, place, and time. She appears well-developed and well-nourished. No distress.  HENT:  Mouth/Throat: Oropharynx is clear and moist. No oropharyngeal exudate.  Cardiovascular: Normal rate, regular rhythm and normal heart sounds.  Exam reveals no gallop and no friction rub.   No murmur heard. Pulmonary/Chest: Effort normal and breath sounds normal. No respiratory distress. She has no wheezes.  Abdominal: Soft. Bowel sounds are normal. She exhibits no distension. There is no tenderness.  Lymphadenopathy:    She has no cervical adenopathy.  Neurological: She is alert and oriented to person, place, and time.  Skin: Skin is warm and dry.  Psychiatric: Judgment and thought content normal.          Assessment & Plan:

## 2011-01-31 NOTE — Assessment & Plan Note (Signed)
She is doing very well and viral load nearly undetectable.  Patient is very pleased with the result.  She was reminded of condom use.  She will return in about 3 months for follow up and told to come in sooner if she has any other problems.  She is utd on immunizations and PAP.

## 2011-02-20 ENCOUNTER — Telehealth: Payer: Self-pay | Admitting: *Deleted

## 2011-02-20 NOTE — Telephone Encounter (Signed)
Faxed prior auth to [employer] for stibild. Form in red chart

## 2011-02-24 ENCOUNTER — Telehealth: Payer: Self-pay | Admitting: Internal Medicine

## 2011-02-24 NOTE — Telephone Encounter (Signed)
Received fax from [employer]   A prior authorization for Stribild is not necessary.  It is a covered drug by Medicare. I also called [employer] to see if this means Stribild is covered for all Medicare patients.  They are contacting Medicare to see if it has been added to the formulary of if it is just for Ms Karpowicz.  [employer] will contact me within the next two day and let me know what Medicare says.

## 2011-02-27 ENCOUNTER — Telehealth: Payer: Self-pay | Admitting: Internal Medicine

## 2011-02-27 NOTE — Telephone Encounter (Signed)
Received fax from [employer].  They need patient's consent to verify coverage.  Called Yolanda Jones.  Told her to call [employer] so they can do the conference call to Medicare.  She said she will call them this afternoon.

## 2011-05-19 ENCOUNTER — Other Ambulatory Visit: Payer: Medicare Other

## 2011-05-19 DIAGNOSIS — B20 Human immunodeficiency virus [HIV] disease: Secondary | ICD-10-CM

## 2011-05-20 LAB — COMPREHENSIVE METABOLIC PANEL
ALT: 13 U/L (ref 0–35)
AST: 17 U/L (ref 0–37)
Albumin: 4 g/dL (ref 3.5–5.2)
Alkaline Phosphatase: 63 U/L (ref 39–117)
Potassium: 4.4 mEq/L (ref 3.5–5.3)
Sodium: 139 mEq/L (ref 135–145)
Total Protein: 6.7 g/dL (ref 6.0–8.3)

## 2011-05-20 LAB — CBC WITH DIFFERENTIAL/PLATELET
Basophils Absolute: 0 10*3/uL (ref 0.0–0.1)
Eosinophils Relative: 0 % (ref 0–5)
HCT: 40.7 % (ref 36.0–46.0)
Lymphocytes Relative: 16 % (ref 12–46)
MCHC: 32.7 g/dL (ref 30.0–36.0)
MCV: 97.1 fL (ref 78.0–100.0)
Monocytes Absolute: 0.7 10*3/uL (ref 0.1–1.0)
RDW: 13.5 % (ref 11.5–15.5)
WBC: 8.2 10*3/uL (ref 4.0–10.5)

## 2011-06-02 ENCOUNTER — Encounter: Payer: Self-pay | Admitting: Internal Medicine

## 2011-06-02 ENCOUNTER — Ambulatory Visit (INDEPENDENT_AMBULATORY_CARE_PROVIDER_SITE_OTHER): Payer: Medicare Other | Admitting: Internal Medicine

## 2011-06-02 VITALS — BP 117/81 | HR 114 | Temp 98.5°F | Ht 60.0 in | Wt 175.0 lb

## 2011-06-02 DIAGNOSIS — B20 Human immunodeficiency virus [HIV] disease: Secondary | ICD-10-CM

## 2011-06-02 MED ORDER — ELVITEG-COBIC-EMTRICIT-TENOFDF 150-150-200-300 MG PO TABS: 1.0000 | ORAL_TABLET | Freq: Every day | ORAL | Status: DC

## 2011-06-02 NOTE — Assessment & Plan Note (Signed)
Excellent compliance.  No issues.  Received condoms.  Will do Tdap next visit (patient in a hurry today).  rtc 4 months.

## 2011-06-02 NOTE — Patient Instructions (Signed)
Keep it up!

## 2011-06-02 NOTE — Progress Notes (Signed)
  Subjective:    Patient ID: Yolanda Jones, female    DOB: 06/27/1990, 31 y.o.   MRN: [id number]  HPI here for follow up of HIV.  Continues to take Stribild with no missed doses.  Has had some fatigue and was afraid it was due to failing treatment after changing her schedule by a few hours.  No new issues.  Gets PAP from gyn and has a PCP.      Review of Systems  Constitutional: Positive for fatigue. Negative for fever, chills and unexpected weight change.  HENT: Negative for sore throat and trouble swallowing.   Respiratory: Negative for cough and shortness of breath.   Cardiovascular: Negative for chest pain, palpitations and leg swelling.  Gastrointestinal: Negative for nausea, abdominal pain and diarrhea.  Genitourinary: Negative for dysuria, difficulty urinating, genital sores and pelvic pain.  Musculoskeletal: Negative for myalgias, joint swelling and arthralgias.  Skin: Negative for rash.  Neurological: Negative for dizziness, weakness and headaches.  Psychiatric/Behavioral: Negative for dysphoric mood. The patient is not nervous/anxious.        Objective:   Physical Exam  Constitutional: She appears well-developed and well-nourished. No distress.  HENT:  Mouth/Throat: Oropharynx is clear and moist. No oropharyngeal exudate.  Cardiovascular: Normal rate, regular rhythm and normal heart sounds.  Exam reveals no gallop and no friction rub.   No murmur heard. Pulmonary/Chest: Effort normal and breath sounds normal. No respiratory distress. She has no wheezes. She has no rales.  Abdominal: Soft. Bowel sounds are normal. She exhibits no distension. There is no tenderness. There is no rebound.  Lymphadenopathy:    She has no cervical adenopathy.          Assessment & Plan:

## 2011-09-18 ENCOUNTER — Other Ambulatory Visit: Payer: Medicare Other

## 2011-09-18 DIAGNOSIS — B20 Human immunodeficiency virus [HIV] disease: Secondary | ICD-10-CM

## 2011-09-18 LAB — CBC WITH DIFFERENTIAL/PLATELET
Basophils Absolute: 0 10*3/uL (ref 0.0–0.1)
HCT: 36.9 % (ref 36.0–46.0)
Hemoglobin: 12.9 g/dL (ref 12.0–15.0)
Lymphocytes Relative: 34 % (ref 12–46)
Monocytes Absolute: 0.4 10*3/uL (ref 0.1–1.0)
Neutro Abs: 3.2 10*3/uL (ref 1.7–7.7)
RDW: 14.4 % (ref 11.5–15.5)
WBC: 5.6 10*3/uL (ref 4.0–10.5)

## 2011-09-19 LAB — COMPREHENSIVE METABOLIC PANEL
ALT: 12 U/L (ref 0–35)
BUN: 16 mg/dL (ref 6–23)
CO2: 27 mEq/L (ref 19–32)
Calcium: 9.5 mg/dL (ref 8.4–10.5)
Chloride: 104 mEq/L (ref 96–112)
Creat: 1.17 mg/dL — ABNORMAL HIGH (ref 0.50–1.10)
Glucose, Bld: 76 mg/dL (ref 70–99)

## 2011-09-19 LAB — T-HELPER CELL (CD4) - (RCID CLINIC ONLY): CD4 % Helper T Cell: 49 % (ref 33–55)

## 2011-10-02 ENCOUNTER — Ambulatory Visit: Payer: Medicare Other | Admitting: Internal Medicine

## 2011-10-21 ENCOUNTER — Ambulatory Visit (INDEPENDENT_AMBULATORY_CARE_PROVIDER_SITE_OTHER): Payer: Medicare Other | Admitting: Internal Medicine

## 2011-10-21 ENCOUNTER — Encounter: Payer: Self-pay | Admitting: Internal Medicine

## 2011-10-21 VITALS — BP 91/65 | HR 96 | Temp 98.0°F | Ht 60.0 in | Wt 182.0 lb

## 2011-10-21 DIAGNOSIS — B20 Human immunodeficiency virus [HIV] disease: Secondary | ICD-10-CM

## 2011-10-21 DIAGNOSIS — Z113 Encounter for screening for infections with a predominantly sexual mode of transmission: Secondary | ICD-10-CM

## 2011-10-21 NOTE — Progress Notes (Signed)
  Subjective:    Patient ID: Yolanda Jones, female    DOB: 06/27/1990, 31 y.o.   MRN: [id number]  HPI She comes in here for followup of her HIV. She continues to take Stribild and denies any missed doses. She feels well with the medication and has no complaints. She does struggle with anxiety and mental illness and continues to see a psychiatrist. She has her Pap smear from a gynecologist and does see PCP.   Review of Systems  Constitutional: Negative for fever, fatigue and unexpected weight change.  HENT: Negative for sore throat and trouble swallowing.   Respiratory: Negative for cough and shortness of breath.   Cardiovascular: Negative for leg swelling.  Gastrointestinal: Negative for nausea, abdominal pain and diarrhea.  Musculoskeletal: Negative for myalgias and arthralgias.  Neurological: Negative for dizziness, light-headedness and headaches.  Psychiatric/Behavioral: Negative for suicidal ideas. The patient is nervous/anxious.        Objective:   Physical Exam  Constitutional: She appears well-developed and well-nourished. No distress.  HENT:  Mouth/Throat: Oropharynx is clear and moist. No oropharyngeal exudate.  Cardiovascular: Normal rate, regular rhythm and normal heart sounds.  Exam reveals no gallop and no friction rub.   No murmur heard. Pulmonary/Chest: Effort normal and breath sounds normal. No respiratory distress. She has no wheezes. She has no rales.  Abdominal: Soft. Bowel sounds are normal. She exhibits no distension. There is no tenderness. There is no rebound.  Lymphadenopathy:    She has no cervical adenopathy.          Assessment & Plan:

## 2011-10-21 NOTE — Assessment & Plan Note (Signed)
She continues to do well on this regimen and has no issues. She was offered and given condoms and is to use condoms with all social activity. 2 return 4 months

## 2012-01-20 ENCOUNTER — Telehealth: Payer: Self-pay

## 2012-01-20 NOTE — Telephone Encounter (Signed)
LM for pharmacist that this RF + 1 is ok on Valtrex 500 mg tabs sig: take 1 tab q 12 hr x 3 days # 6 . Pt is overdue for AEX and should call for appt. Melody Comas A

## 2012-02-09 ENCOUNTER — Other Ambulatory Visit (INDEPENDENT_AMBULATORY_CARE_PROVIDER_SITE_OTHER): Payer: Medicare Other

## 2012-02-09 DIAGNOSIS — Z113 Encounter for screening for infections with a predominantly sexual mode of transmission: Secondary | ICD-10-CM

## 2012-02-09 DIAGNOSIS — B20 Human immunodeficiency virus [HIV] disease: Secondary | ICD-10-CM

## 2012-02-09 DIAGNOSIS — Z79899 Other long term (current) drug therapy: Secondary | ICD-10-CM

## 2012-02-09 LAB — CBC WITH DIFFERENTIAL/PLATELET
Eosinophils Relative: 5 % (ref 0–5)
HCT: 35.7 % — ABNORMAL LOW (ref 36.0–46.0)
Lymphocytes Relative: 25 % (ref 12–46)
Lymphs Abs: 1.4 10*3/uL (ref 0.7–4.0)
MCV: 92.2 fL (ref 78.0–100.0)
Monocytes Absolute: 0.5 10*3/uL (ref 0.1–1.0)
RBC: 3.87 MIL/uL (ref 3.87–5.11)
RDW: 14.2 % (ref 11.5–15.5)
WBC: 5.7 10*3/uL (ref 4.0–10.5)

## 2012-02-10 LAB — COMPLETE METABOLIC PANEL WITH GFR
BUN: 10 mg/dL (ref 6–23)
CO2: 27 mEq/L (ref 19–32)
Calcium: 9.3 mg/dL (ref 8.4–10.5)
Chloride: 106 mEq/L (ref 96–112)
Creat: 1.06 mg/dL (ref 0.50–1.10)
GFR, Est African American: 86 mL/min
Glucose, Bld: 80 mg/dL (ref 70–99)

## 2012-02-10 LAB — RPR

## 2012-02-10 LAB — T-HELPER CELL (CD4) - (RCID CLINIC ONLY): CD4 T Cell Abs: 720 uL (ref 400–2700)

## 2012-02-16 ENCOUNTER — Ambulatory Visit: Payer: Self-pay | Admitting: Obstetrics and Gynecology

## 2012-02-19 ENCOUNTER — Ambulatory Visit: Payer: Medicare Other | Admitting: Internal Medicine

## 2012-02-19 ENCOUNTER — Telehealth: Payer: Self-pay | Admitting: *Deleted

## 2012-02-19 NOTE — Telephone Encounter (Signed)
Called and left patient a voice mail to call the clinic to reschedule appt, she no showed today. Wendall Mola

## 2012-03-05 ENCOUNTER — Telehealth: Payer: Self-pay | Admitting: Obstetrics and Gynecology

## 2012-03-05 NOTE — Telephone Encounter (Signed)
Pt called back regarding her message. There is no documentation of anyone calling her Pt cancelled AEX for 03-08-12 w/AR  Pt may have received a cancellation call  LM for pt call the office to R/S AEX through our appointment dept.  [facility] CMA

## 2012-03-08 ENCOUNTER — Ambulatory Visit: Payer: Self-pay | Admitting: Obstetrics and Gynecology

## 2012-03-16 ENCOUNTER — Encounter: Payer: Self-pay | Admitting: Internal Medicine

## 2012-03-16 ENCOUNTER — Ambulatory Visit (INDEPENDENT_AMBULATORY_CARE_PROVIDER_SITE_OTHER): Payer: Medicare Other | Admitting: Internal Medicine

## 2012-03-16 VITALS — BP 107/77 | HR 97 | Temp 97.8°F | Ht 60.0 in | Wt 193.0 lb

## 2012-03-16 DIAGNOSIS — Z79899 Other long term (current) drug therapy: Secondary | ICD-10-CM

## 2012-03-16 DIAGNOSIS — Z113 Encounter for screening for infections with a predominantly sexual mode of transmission: Secondary | ICD-10-CM

## 2012-03-16 DIAGNOSIS — B20 Human immunodeficiency virus [HIV] disease: Secondary | ICD-10-CM

## 2012-03-16 DIAGNOSIS — R635 Abnormal weight gain: Secondary | ICD-10-CM

## 2012-03-16 NOTE — Progress Notes (Signed)
  Subjective:    Patient ID: Yolanda Jones, female    DOB: [birth year], 32 y.o.   MRN: [id number]  HPI He comes in for followup of his HIV. She continues to take Stribild and feels well. She does see psychiatry and see her primary physician. She does see her gynecologist for her Pap smear. She feels well today although she does endorse some weight gain.  She does exercise with yoga though she does drink Coca[employer]a in does not do any cardiovascular exercise.  He denies any missed doses.   Review of Systems  Constitutional: Negative for fever, chills, activity change and fatigue.  Gastrointestinal: Negative for nausea, abdominal pain and diarrhea.  Musculoskeletal: Negative for myalgias, joint swelling and arthralgias.  Neurological: Negative for dizziness and headaches.       Objective:   Physical Exam  Constitutional: She appears well-developed and well-nourished. No distress.  Cardiovascular: Normal rate, regular rhythm and normal heart sounds.  Exam reveals no gallop and no friction rub.   No murmur heard. Pulmonary/Chest: Breath sounds normal. No respiratory distress. She has no wheezes. She has no rales.  Psychiatric:       Some pressured speech but redirectable          Assessment & Plan:

## 2012-03-16 NOTE — Assessment & Plan Note (Addendum)
She is doing well on her medication and she will continue with the same. She can followup in 4 months.  I will check her TSH today with her weight gain.

## 2012-03-17 ENCOUNTER — Telehealth: Payer: Self-pay | Admitting: *Deleted

## 2012-03-17 LAB — TSH: TSH: 2.253 u[IU]/mL (ref 0.350–4.500)

## 2012-03-17 NOTE — Telephone Encounter (Signed)
Patient called and notified of normal TSH results. Patient still concerned about weight gain. Made appointment for 03/22/12 at 11:30 with Nutrition. Patient verbalized agreement and excitement with this plan. Andree Coss, RN

## 2012-03-22 ENCOUNTER — Ambulatory Visit: Payer: Medicare Other

## 2012-03-22 DIAGNOSIS — Z8639 Personal history of other endocrine, nutritional and metabolic disease: Secondary | ICD-10-CM | POA: Insufficient documentation

## 2012-03-22 DIAGNOSIS — B20 Human immunodeficiency virus [HIV] disease: Secondary | ICD-10-CM | POA: Insufficient documentation

## 2012-03-22 DIAGNOSIS — B009 Herpesviral infection, unspecified: Secondary | ICD-10-CM | POA: Insufficient documentation

## 2012-03-24 ENCOUNTER — Ambulatory Visit: Payer: Self-pay | Admitting: Obstetrics and Gynecology

## 2012-06-04 ENCOUNTER — Other Ambulatory Visit: Payer: Self-pay | Admitting: *Deleted

## 2012-06-04 DIAGNOSIS — B20 Human immunodeficiency virus [HIV] disease: Secondary | ICD-10-CM

## 2012-06-04 MED ORDER — ELVITEG-COBIC-EMTRICIT-TENOFDF 150-150-200-300 MG PO TABS: 1.0000 | ORAL_TABLET | Freq: Every day | ORAL | Status: DC

## 2012-06-30 ENCOUNTER — Other Ambulatory Visit: Payer: Self-pay | Admitting: Infectious Disease

## 2012-06-30 ENCOUNTER — Other Ambulatory Visit (HOSPITAL_COMMUNITY)
Admission: RE | Admit: 2012-06-30 | Discharge: 2012-06-30 | Disposition: A | Discharge status: Home / Self Care | Payer: Medicare Other | Source: Ambulatory Visit | Attending: Infectious Disease | Admitting: Infectious Disease

## 2012-06-30 ENCOUNTER — Other Ambulatory Visit: Payer: Medicare Other

## 2012-06-30 DIAGNOSIS — Z113 Encounter for screening for infections with a predominantly sexual mode of transmission: Secondary | ICD-10-CM

## 2012-06-30 DIAGNOSIS — B20 Human immunodeficiency virus [HIV] disease: Secondary | ICD-10-CM

## 2012-06-30 DIAGNOSIS — Z79899 Other long term (current) drug therapy: Secondary | ICD-10-CM

## 2012-06-30 LAB — COMPLETE METABOLIC PANEL WITH GFR
ALT: 13 U/L (ref 0–35)
AST: 18 U/L (ref 0–37)
Alkaline Phosphatase: 89 U/L (ref 39–117)
BUN: 8 mg/dL (ref 6–23)
Chloride: 106 mEq/L (ref 96–112)
Creat: 1.12 mg/dL — ABNORMAL HIGH (ref 0.50–1.10)
Potassium: 4.5 mEq/L (ref 3.5–5.3)

## 2012-06-30 LAB — LIPID PANEL
Cholesterol: 213 mg/dL — ABNORMAL HIGH (ref 0–200)
LDL Cholesterol: 126 mg/dL — ABNORMAL HIGH (ref 0–99)
Total CHOL/HDL Ratio: 5.8 Ratio
Triglycerides: 251 mg/dL — ABNORMAL HIGH (ref <150)
VLDL: 50 mg/dL — ABNORMAL HIGH (ref 0–40)

## 2012-06-30 LAB — CBC WITH DIFFERENTIAL/PLATELET
Basophils Absolute: 0 10*3/uL (ref 0.0–0.1)
Basophils Relative: 0 % (ref 0–1)
Eosinophils Relative: 3 % (ref 0–5)
HCT: 35.9 % — ABNORMAL LOW (ref 36.0–46.0)
Lymphocytes Relative: 30 % (ref 12–46)
MCHC: 34 g/dL (ref 30.0–36.0)
Monocytes Absolute: 0.4 10*3/uL (ref 0.1–1.0)
Neutro Abs: 3.7 10*3/uL (ref 1.7–7.7)
Platelets: 399 10*3/uL (ref 150–400)
RDW: 14.6 % (ref 11.5–15.5)
WBC: 6 10*3/uL (ref 4.0–10.5)

## 2012-07-01 LAB — HIV-1 RNA QUANT-NO REFLEX-BLD: HIV-1 RNA Quant, Log: <1.3 {Log} (ref <1.30)

## 2012-07-14 ENCOUNTER — Ambulatory Visit: Payer: Medicare Other | Admitting: Internal Medicine

## 2012-07-15 ENCOUNTER — Ambulatory Visit (INDEPENDENT_AMBULATORY_CARE_PROVIDER_SITE_OTHER): Payer: Medicare Other | Admitting: Internal Medicine

## 2012-07-15 ENCOUNTER — Encounter: Payer: Self-pay | Admitting: Internal Medicine

## 2012-07-15 VITALS — BP 100/50 | HR 98 | Temp 98.5°F | Ht 60.0 in | Wt 198.0 lb

## 2012-07-15 DIAGNOSIS — T50905A Adverse effect of unspecified drugs, medicaments and biological substances, initial encounter: Secondary | ICD-10-CM | POA: Insufficient documentation

## 2012-07-15 DIAGNOSIS — B20 Human immunodeficiency virus [HIV] disease: Secondary | ICD-10-CM

## 2012-07-15 DIAGNOSIS — R638 Other symptoms and signs concerning food and fluid intake: Secondary | ICD-10-CM

## 2012-07-15 DIAGNOSIS — R635 Abnormal weight gain: Secondary | ICD-10-CM | POA: Insufficient documentation

## 2012-07-15 DIAGNOSIS — Z21 Asymptomatic human immunodeficiency virus [HIV] infection status: Secondary | ICD-10-CM

## 2012-07-15 NOTE — Assessment & Plan Note (Signed)
She is doing well with her regimen we'll continue. Return to clinic in 4 months

## 2012-07-15 NOTE — Assessment & Plan Note (Signed)
I did talk to her about her weight gain which she is concerned about. Part of this is likely due to some medication effect. She did not make the nutritionist appointment. I discussed other ways of decreasing her intake

## 2012-07-15 NOTE — Progress Notes (Signed)
  Subjective:    Patient ID: Yolanda Jones, female    DOB: [birth year], 32 y.o.   MRN: [id number]  HPI She comes in for followup of her HIV. She continues to take Stribild and feels well. She does see psychiatry and see her primary physician. She does see her gynecologist for her Pap smear. She feels well today although she does endorse some weight gain again. She does exercise with yoga though she does drink Coca-Cola but less than before and does not do any cardiovascular exercise. Denies any missed doses.  No diarrhea.      Review of Systems  Constitutional: Negative for fever, chills and fatigue.  HENT: Negative for sore throat and trouble swallowing.        Dry mouth  Cardiovascular: Negative for leg swelling.  Gastrointestinal: Negative for diarrhea and constipation.  Skin: Negative for rash.  Neurological: Negative for dizziness and headaches.  Psychiatric/Behavioral: Negative for dysphoric mood. The patient is hyperactive.        Objective:   Physical Exam  Constitutional: She appears well-developed and well-nourished. No distress.  HENT:  Mouth/Throat: No oropharyngeal exudate.  Cardiovascular: Normal rate, regular rhythm and normal heart sounds.   No murmur heard. Pulmonary/Chest: Effort normal and breath sounds normal. No respiratory distress. She has no wheezes.  Lymphadenopathy:    She has no cervical adenopathy.  Skin: No rash noted.          Assessment & Plan:

## 2012-08-02 ENCOUNTER — Telehealth: Payer: Self-pay | Admitting: *Deleted

## 2012-08-02 NOTE — Telephone Encounter (Signed)
Left message.   Requested pt call Yolanda Jones to make annual exam appt w/ PAP smear

## 2012-08-17 ENCOUNTER — Encounter: Payer: Self-pay | Admitting: *Deleted

## 2012-09-01 ENCOUNTER — Emergency Department (HOSPITAL_BASED_OUTPATIENT_CLINIC_OR_DEPARTMENT_OTHER)
Admission: EM | Admit: 2012-09-01 | Discharge: 2012-09-01 | Disposition: A | Discharge status: Home / Self Care | Payer: Medicare Other | Attending: Emergency Medicine | Admitting: Emergency Medicine

## 2012-09-01 ENCOUNTER — Emergency Department (HOSPITAL_BASED_OUTPATIENT_CLINIC_OR_DEPARTMENT_OTHER): Payer: Medicare Other

## 2012-09-01 ENCOUNTER — Encounter (HOSPITAL_BASED_OUTPATIENT_CLINIC_OR_DEPARTMENT_OTHER): Payer: Self-pay | Admitting: *Deleted

## 2012-09-01 DIAGNOSIS — Z87828 Personal history of other (healed) physical injury and trauma: Secondary | ICD-10-CM | POA: Insufficient documentation

## 2012-09-01 DIAGNOSIS — S93401A Sprain of unspecified ligament of right ankle, initial encounter: Secondary | ICD-10-CM

## 2012-09-01 DIAGNOSIS — Y939 Activity, unspecified: Secondary | ICD-10-CM | POA: Insufficient documentation

## 2012-09-01 DIAGNOSIS — X58XXXA Exposure to other specified factors, initial encounter: Secondary | ICD-10-CM | POA: Insufficient documentation

## 2012-09-01 DIAGNOSIS — S93409A Sprain of unspecified ligament of unspecified ankle, initial encounter: Secondary | ICD-10-CM | POA: Insufficient documentation

## 2012-09-01 DIAGNOSIS — Z8639 Personal history of other endocrine, nutritional and metabolic disease: Secondary | ICD-10-CM | POA: Insufficient documentation

## 2012-09-01 DIAGNOSIS — Z862 Personal history of diseases of the blood and blood-forming organs and certain disorders involving the immune mechanism: Secondary | ICD-10-CM | POA: Insufficient documentation

## 2012-09-01 DIAGNOSIS — Z8619 Personal history of other infectious and parasitic diseases: Secondary | ICD-10-CM | POA: Insufficient documentation

## 2012-09-01 DIAGNOSIS — Z79899 Other long term (current) drug therapy: Secondary | ICD-10-CM | POA: Insufficient documentation

## 2012-09-01 DIAGNOSIS — F1910 Other psychoactive substance abuse, uncomplicated: Secondary | ICD-10-CM | POA: Insufficient documentation

## 2012-09-01 DIAGNOSIS — Y929 Unspecified place or not applicable: Secondary | ICD-10-CM | POA: Insufficient documentation

## 2012-09-01 MED ORDER — HYDROCODONE-ACETAMINOPHEN 5-325 MG PO TABS: 1.0000 | ORAL_TABLET | Freq: Four times a day (QID) | ORAL | Status: DC | PRN
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
## 2012-09-01 NOTE — ED Notes (Signed)
Pt c/o right ankle injury x 3 weeks ago

## 2012-11-11 ENCOUNTER — Other Ambulatory Visit: Payer: Medicare Other

## 2012-11-11 DIAGNOSIS — B20 Human immunodeficiency virus [HIV] disease: Secondary | ICD-10-CM

## 2012-11-11 LAB — CBC WITH DIFFERENTIAL/PLATELET
Basophils Relative: 1 % (ref 0–1)
Eosinophils Absolute: 0.4 10*3/uL (ref 0.0–0.7)
HCT: 33.9 % — ABNORMAL LOW (ref 36.0–46.0)
Hemoglobin: 11.5 g/dL — ABNORMAL LOW (ref 12.0–15.0)
MCH: 29.2 pg (ref 26.0–34.0)
MCHC: 33.9 g/dL (ref 30.0–36.0)
Monocytes Absolute: 0.4 10*3/uL (ref 0.1–1.0)
Monocytes Relative: 6 % (ref 3–12)
Neutro Abs: 4 10*3/uL (ref 1.7–7.7)

## 2012-11-11 LAB — COMPLETE METABOLIC PANEL WITH GFR
ALT: 16 U/L (ref 0–35)
Alkaline Phosphatase: 99 U/L (ref 39–117)
Sodium: 138 mEq/L (ref 135–145)
Total Bilirubin: 0.2 mg/dL — ABNORMAL LOW (ref 0.3–1.2)
Total Protein: 6.9 g/dL (ref 6.0–8.3)

## 2012-11-12 LAB — T-HELPER CELL (CD4) - (RCID CLINIC ONLY): CD4 % Helper T Cell: 43 % (ref 33–55)

## 2012-11-12 LAB — HIV-1 RNA QUANT-NO REFLEX-BLD
HIV 1 RNA Quant: <20 copies/mL (ref <20)
HIV-1 RNA Quant, Log: <1.3 {Log} (ref <1.30)

## 2012-11-25 ENCOUNTER — Ambulatory Visit: Payer: Medicare Other | Admitting: Internal Medicine

## 2012-12-14 ENCOUNTER — Ambulatory Visit (INDEPENDENT_AMBULATORY_CARE_PROVIDER_SITE_OTHER): Payer: Medicare Other | Admitting: Internal Medicine

## 2012-12-14 ENCOUNTER — Encounter: Payer: Self-pay | Admitting: Internal Medicine

## 2012-12-14 VITALS — BP 111/79 | HR 103 | Temp 97.7°F | Ht 60.0 in | Wt 200.0 lb

## 2012-12-14 DIAGNOSIS — R87619 Unspecified abnormal cytological findings in specimens from cervix uteri: Secondary | ICD-10-CM

## 2012-12-14 DIAGNOSIS — IMO0002 Reserved for concepts with insufficient information to code with codable children: Secondary | ICD-10-CM

## 2012-12-14 DIAGNOSIS — B20 Human immunodeficiency virus [HIV] disease: Secondary | ICD-10-CM

## 2012-12-14 DIAGNOSIS — R635 Abnormal weight gain: Secondary | ICD-10-CM

## 2012-12-14 DIAGNOSIS — R638 Other symptoms and signs concerning food and fluid intake: Secondary | ICD-10-CM

## 2012-12-14 DIAGNOSIS — R6889 Other general symptoms and signs: Secondary | ICD-10-CM

## 2012-12-14 DIAGNOSIS — Z113 Encounter for screening for infections with a predominantly sexual mode of transmission: Secondary | ICD-10-CM

## 2012-12-14 DIAGNOSIS — Z79899 Other long term (current) drug therapy: Secondary | ICD-10-CM

## 2012-12-14 DIAGNOSIS — Z21 Asymptomatic human immunodeficiency virus [HIV] infection status: Secondary | ICD-10-CM

## 2012-12-14 DIAGNOSIS — T50905A Adverse effect of unspecified drugs, medicaments and biological substances, initial encounter: Secondary | ICD-10-CM

## 2012-12-14 NOTE — Assessment & Plan Note (Signed)
Her and I did discuss the importance of followup after an abnormal Pap smear. She is going to go back to her primary gynecologist for followup and reschedule her biopsy procedure.

## 2012-12-14 NOTE — Assessment & Plan Note (Signed)
She is doing well with her HIV and will return in 4 months.

## 2012-12-14 NOTE — Progress Notes (Signed)
  Subjective:    Patient ID: Yolanda Jones, female    DOB: [birth year], 32 y.o.   MRN: [id number]  HPI  She comes in for followup of her HIV. She continues to take Stribild and feels well. She does see psychiatry and see her primary physician. She does see her gynecologist for her Pap smear and was abnormal.  She was scheduled for a leep but did not show up and feels that she keeps getting them and doesn't want it again. She feels well today although she does endorse some weight gain again. She does exercise with yoga though she drinks sweet tea. Denies any missed doses.  No diarrhea.      Review of Systems  Constitutional: Negative for fever, chills and fatigue.  HENT: Negative for sore throat and trouble swallowing.        Dry mouth  Eyes: Negative for visual disturbance.  Respiratory: Negative for shortness of breath.   Cardiovascular: Negative for leg swelling.  Gastrointestinal: Negative for diarrhea and constipation.  Skin: Negative for rash.  Neurological: Negative for dizziness and headaches.  Psychiatric/Behavioral: Negative for dysphoric mood. The patient is not hyperactive.        Objective:   Physical Exam  Constitutional: She appears well-developed and well-nourished. No distress.  HENT:  Mouth/Throat: No oropharyngeal exudate.  Eyes: Right eye exhibits no discharge. Left eye exhibits no discharge. No scleral icterus.  Cardiovascular: Normal rate, regular rhythm and normal heart sounds.   No murmur heard. Pulmonary/Chest: Effort normal and breath sounds normal. No respiratory distress. She has no wheezes.  Lymphadenopathy:    She has no cervical adenopathy.  Neurological: She is alert.  Skin: No rash noted.  Psychiatric: She has a normal mood and affect.          Assessment & Plan:

## 2012-12-14 NOTE — Assessment & Plan Note (Signed)
She is again interested in her weight and in his interested in seeing a nutritionist.

## 2012-12-31 ENCOUNTER — Other Ambulatory Visit (HOSPITAL_COMMUNITY): Payer: Self-pay | Admitting: Gastroenterology

## 2012-12-31 ENCOUNTER — Other Ambulatory Visit: Payer: Self-pay | Admitting: Gastroenterology

## 2012-12-31 DIAGNOSIS — K219 Gastro-esophageal reflux disease without esophagitis: Secondary | ICD-10-CM

## 2013-01-04 ENCOUNTER — Ambulatory Visit
Admission: RE | Admit: 2013-01-04 | Discharge: 2013-01-04 | Disposition: A | Discharge status: Home / Self Care | Payer: Medicare Other | Source: Ambulatory Visit | Attending: Gastroenterology | Admitting: Gastroenterology

## 2013-01-04 DIAGNOSIS — K219 Gastro-esophageal reflux disease without esophagitis: Secondary | ICD-10-CM

## 2013-01-10 ENCOUNTER — Ambulatory Visit: Payer: Medicare Other

## 2013-01-20 ENCOUNTER — Encounter (HOSPITAL_COMMUNITY)
Admission: RE | Admit: 2013-01-20 | Discharge: 2013-01-20 | Disposition: A | Discharge status: Home / Self Care | Payer: Medicare Other | Source: Ambulatory Visit | Attending: Gastroenterology | Admitting: Gastroenterology

## 2013-01-20 DIAGNOSIS — K3189 Other diseases of stomach and duodenum: Secondary | ICD-10-CM | POA: Insufficient documentation

## 2013-01-20 DIAGNOSIS — K219 Gastro-esophageal reflux disease without esophagitis: Secondary | ICD-10-CM | POA: Insufficient documentation

## 2013-01-20 MED ORDER — TECHNETIUM TC 99M SULFUR COLLOID
2.1000 | Freq: Once | INTRAVENOUS | Status: AC | PRN
  Administered 2013-01-20: 2.1 via INTRAVENOUS

## 2013-02-14 ENCOUNTER — Ambulatory Visit: Payer: Medicare Other

## 2013-02-14 ENCOUNTER — Encounter: Payer: Medicaid Other | Attending: Internal Medicine | Admitting: *Deleted

## 2013-02-14 VITALS — Ht 60.0 in | Wt 200.5 lb

## 2013-02-14 DIAGNOSIS — R635 Abnormal weight gain: Secondary | ICD-10-CM

## 2013-02-14 DIAGNOSIS — T50905A Adverse effect of unspecified drugs, medicaments and biological substances, initial encounter: Principal | ICD-10-CM

## 2013-02-14 DIAGNOSIS — Z713 Dietary counseling and surveillance: Secondary | ICD-10-CM | POA: Insufficient documentation

## 2013-02-14 NOTE — Progress Notes (Addendum)
Medical Nutrition Therapy:  Appt start time: 1030 end time:  1130.  Assessment:  Patient here today for weight management counseling. She reports that she has always been heavier, but has been putting on weight rapidly recently. This may be due to medications. She reports that the extra weight causes her to get winded easily, limiting physical activity. She does do 10 minutes of yoga most days. She has an altered sleep schedule going to be around 7-9am and waking up at 5pm. This limits her daily activity. She reports seeing a gastroenterologist for vomiting at night, likely related to delayed stomach emptying. She states that she does not get hungry often, and only eats 1 meal a day. She used to drink more soda 3-4 months ago, but has since cut back to 1 20 oz bottle a day.   MEDICATIONS: Reviewed.    DIETARY INTAKE:   Usual eating pattern includes 1 meals and 2 snacks per day.  24-hr recall:  B ( AM): Usually skips  Snk ( AM): Sunflower seeds, cashews, popcorn, banana, Coffee (chocolate/caramel, milk) L ( PM): None Snk ( PM): Banana D (6-10 PM): Pasta, vegetables OR meat, 2 vegetables Snk ( PM): None Beverages: Water, 1 20 oz soda daily  Usual physical activity: 10 minutes yoga most days  Estimated energy needs: 1500 calories 188 g carbohydrates 94 g protein 42 g fat  Progress Towards Goal(s):  In progress.   Nutritional Diagnosis:  [place]-3.3 Overweight/obesity As related to excessive energy intake and physical inactivity .  As evidenced by BMI >30.    Intervention:  Nutrition counseling. We discussed strategies for weight loss, including balancing nutrients (carbs, protein, fat), portion control, healthy snacks, and exercise.   Goals:  1. 0.5 pounds weight loss per week.  2. Transition to regular sleep schedule to promote increased activity and regular eating habits.  3. Start with eating 2 small, balanced meals daily. Work up to 3.  4. Choose healthy, low calorie snacks up to 3  times daily.  5. Limit soda to 1 bottle every other day. Work toward limiting to 1 bottle per week.  6. Slowly increase physical activity. Start walking 5-10 minutes 3 days weekly.   Handouts given during visit include:  Weight loss tips  Meal plan card  Monitoring/Evaluation:  Dietary intake, exercise, and body weight prn.

## 2013-02-26 ENCOUNTER — Emergency Department (HOSPITAL_COMMUNITY): Payer: Medicare Other

## 2013-02-26 ENCOUNTER — Encounter (HOSPITAL_COMMUNITY): Payer: Self-pay | Admitting: Emergency Medicine

## 2013-02-26 ENCOUNTER — Emergency Department (HOSPITAL_COMMUNITY)
Admission: EM | Admit: 2013-02-26 | Discharge: 2013-02-26 | Disposition: A | Discharge status: Home / Self Care | Payer: Medicare Other | Attending: Emergency Medicine | Admitting: Emergency Medicine

## 2013-02-26 DIAGNOSIS — Z79899 Other long term (current) drug therapy: Secondary | ICD-10-CM | POA: Insufficient documentation

## 2013-02-26 DIAGNOSIS — Z862 Personal history of diseases of the blood and blood-forming organs and certain disorders involving the immune mechanism: Secondary | ICD-10-CM | POA: Insufficient documentation

## 2013-02-26 DIAGNOSIS — Z87828 Personal history of other (healed) physical injury and trauma: Secondary | ICD-10-CM | POA: Insufficient documentation

## 2013-02-26 DIAGNOSIS — M25571 Pain in right ankle and joints of right foot: Secondary | ICD-10-CM

## 2013-02-26 DIAGNOSIS — F411 Generalized anxiety disorder: Secondary | ICD-10-CM | POA: Insufficient documentation

## 2013-02-26 DIAGNOSIS — Z8619 Personal history of other infectious and parasitic diseases: Secondary | ICD-10-CM | POA: Insufficient documentation

## 2013-02-26 DIAGNOSIS — M25579 Pain in unspecified ankle and joints of unspecified foot: Secondary | ICD-10-CM | POA: Insufficient documentation

## 2013-02-26 DIAGNOSIS — Z8639 Personal history of other endocrine, nutritional and metabolic disease: Secondary | ICD-10-CM | POA: Insufficient documentation

## 2013-02-26 DIAGNOSIS — Z21 Asymptomatic human immunodeficiency virus [HIV] infection status: Secondary | ICD-10-CM | POA: Insufficient documentation

## 2013-02-26 MED ORDER — HYDROCODONE-ACETAMINOPHEN 5-325 MG PO TABS: 2.0000 | ORAL_TABLET | ORAL | Status: DC | PRN

## 2013-02-26 NOTE — Discharge Instructions (Signed)
Arthralgia  Arthralgia is joint pain. A joint is a place where two bones meet. Joint pain can happen for many reasons. The joint can be bruised, stiff, infected, or weak from aging. Pain usually goes away after resting and taking medicine for soreness.   HOME CARE  · Rest the joint as told by your doctor.  · Keep the sore joint raised (elevated) for the first 24 hours.  · Put ice on the joint area.  · Put ice in a plastic bag.  · Place a towel between your skin and the bag.  · Leave the ice on for 15-20 minutes, 03-04 times a day.  · Wear your splint, casting, elastic bandage, or sling as told by your doctor.  · Only take medicine as told by your doctor. Do not take aspirin.  · Use crutches as told by your doctor. Do not put weight on the joint until told to by your doctor.  GET HELP RIGHT AWAY IF:   · You have bruising, puffiness (swelling), or more pain.  · Your fingers or toes turn blue or start to lose feeling (numb).  · Your medicine does not lessen the pain.  · Your pain becomes severe.  · You have a temperature by mouth above 102° F (38.9° C), not controlled by medicine.  · You cannot move or use the joint.  MAKE SURE YOU:   · Understand these instructions.  · Will watch your condition.  · Will get help right away if you are not doing well or get worse.  Document Released: 01/15/2009 Document Revised: 04/21/2011 Document Reviewed: 01/15/2009  Exit[employer]® Patient Information ©2014 [employer].
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
## 2013-02-26 NOTE — ED Notes (Signed)
Patient reports that she has had pain to her right foot x 2 -3 days. The patient denies any knowledge of trauma

## 2013-02-26 NOTE — ED Provider Notes (Signed)
Medical screening examination/treatment/procedure(s) were performed by non-physician practitioner and as supervising physician I was immediately available for consultation/collaboration.   Kathalene Frames, MD 02/26/13 [phone]

## 2013-04-13 ENCOUNTER — Other Ambulatory Visit: Payer: Medicare Other

## 2013-04-13 DIAGNOSIS — B20 Human immunodeficiency virus [HIV] disease: Secondary | ICD-10-CM

## 2013-04-13 DIAGNOSIS — Z79899 Other long term (current) drug therapy: Secondary | ICD-10-CM

## 2013-04-13 DIAGNOSIS — Z113 Encounter for screening for infections with a predominantly sexual mode of transmission: Secondary | ICD-10-CM

## 2013-04-13 LAB — COMPLETE METABOLIC PANEL WITH GFR
ALBUMIN: 3.7 g/dL (ref 3.5–5.2)
ALT: 12 U/L (ref 0–35)
AST: 19 U/L (ref 0–37)
Alkaline Phosphatase: 90 U/L (ref 39–117)
BUN: 7 mg/dL (ref 6–23)
CHLORIDE: 102 meq/L (ref 96–112)
CO2: 28 mEq/L (ref 19–32)
Calcium: 9.1 mg/dL (ref 8.4–10.5)
Creat: 1.2 mg/dL — ABNORMAL HIGH (ref 0.50–1.10)
GFR, EST NON AFRICAN AMERICAN: 59 mL/min — AB
GFR, Est African American: 68 mL/min
GLUCOSE: 81 mg/dL (ref 70–99)
POTASSIUM: 4 meq/L (ref 3.5–5.3)
Sodium: 137 mEq/L (ref 135–145)
TOTAL PROTEIN: 6.7 g/dL (ref 6.0–8.3)
Total Bilirubin: 0.2 mg/dL (ref 0.2–1.2)

## 2013-04-13 LAB — LIPID PANEL
Cholesterol: 220 mg/dL — ABNORMAL HIGH (ref 0–200)
HDL: 35 mg/dL — AB (ref >39)
LDL CALC: 114 mg/dL — AB (ref 0–99)
Total CHOL/HDL Ratio: 6.3 Ratio
Triglycerides: 353 mg/dL — ABNORMAL HIGH (ref <150)
VLDL: 71 mg/dL — ABNORMAL HIGH (ref 0–40)

## 2013-04-13 LAB — CBC WITH DIFFERENTIAL/PLATELET
BASOS ABS: 0.1 10*3/uL (ref 0.0–0.1)
BASOS PCT: 1 % (ref 0–1)
EOS PCT: 4 % (ref 0–5)
Eosinophils Absolute: 0.3 10*3/uL (ref 0.0–0.7)
HCT: 34.1 % — ABNORMAL LOW (ref 36.0–46.0)
Hemoglobin: 11.1 g/dL — ABNORMAL LOW (ref 12.0–15.0)
LYMPHS PCT: 32 % (ref 12–46)
Lymphs Abs: 2 10*3/uL (ref 0.7–4.0)
MCH: 29.1 pg (ref 26.0–34.0)
MCHC: 32.6 g/dL (ref 30.0–36.0)
MCV: 89.3 fL (ref 78.0–100.0)
Monocytes Absolute: 0.5 10*3/uL (ref 0.1–1.0)
Monocytes Relative: 8 % (ref 3–12)
Neutro Abs: 3.5 10*3/uL (ref 1.7–7.7)
Neutrophils Relative %: 55 % (ref 43–77)
PLATELETS: 335 10*3/uL (ref 150–400)
RBC: 3.82 MIL/uL — ABNORMAL LOW (ref 3.87–5.11)
RDW: 15.7 % — AB (ref 11.5–15.5)
WBC: 6.3 10*3/uL (ref 4.0–10.5)

## 2013-04-13 LAB — RPR

## 2013-04-14 LAB — HIV-1 RNA QUANT-NO REFLEX-BLD
HIV 1 RNA Quant: <20 copies/mL (ref <20)
HIV-1 RNA Quant, Log: <1.3 {Log} (ref <1.30)

## 2013-04-14 LAB — T-HELPER CELL (CD4) - (RCID CLINIC ONLY)
CD4 % Helper T Cell: 52 % (ref 33–55)
CD4 T Cell Abs: 1120 /uL (ref 400–2700)

## 2013-04-28 ENCOUNTER — Ambulatory Visit: Payer: Medicare Other

## 2013-05-09 ENCOUNTER — Other Ambulatory Visit: Payer: Self-pay | Admitting: Internal Medicine

## 2013-06-07 ENCOUNTER — Encounter: Payer: Self-pay | Admitting: Internal Medicine

## 2013-06-07 ENCOUNTER — Ambulatory Visit (INDEPENDENT_AMBULATORY_CARE_PROVIDER_SITE_OTHER): Payer: Medicare Other | Admitting: Internal Medicine

## 2013-06-07 VITALS — BP 87/60 | HR 90 | Ht 60.0 in | Wt 198.0 lb

## 2013-06-07 DIAGNOSIS — R635 Abnormal weight gain: Secondary | ICD-10-CM

## 2013-06-07 DIAGNOSIS — B20 Human immunodeficiency virus [HIV] disease: Secondary | ICD-10-CM

## 2013-06-07 DIAGNOSIS — Z21 Asymptomatic human immunodeficiency virus [HIV] infection status: Secondary | ICD-10-CM

## 2013-06-07 DIAGNOSIS — T50905A Adverse effect of unspecified drugs, medicaments and biological substances, initial encounter: Secondary | ICD-10-CM

## 2013-06-07 DIAGNOSIS — R29898 Other symptoms and signs involving the musculoskeletal system: Secondary | ICD-10-CM

## 2013-06-07 DIAGNOSIS — R5381 Other malaise: Secondary | ICD-10-CM

## 2013-06-07 DIAGNOSIS — R638 Other symptoms and signs concerning food and fluid intake: Secondary | ICD-10-CM

## 2013-06-07 DIAGNOSIS — R531 Weakness: Secondary | ICD-10-CM

## 2013-06-07 DIAGNOSIS — R5383 Other fatigue: Secondary | ICD-10-CM

## 2013-06-07 NOTE — Assessment & Plan Note (Signed)
New.  Will have neurology evaluate.

## 2013-06-07 NOTE — Assessment & Plan Note (Signed)
Still with weight.  Saw nutritionist.  Discussed weight loss, exercise.

## 2013-06-07 NOTE — Progress Notes (Signed)
  Subjective:    Patient ID: Yolanda Jones, female    DOB: [birth year], 33 y.o.   MRN: [id number]  HPI  She comes in for followup of her HIV. She continues to take Stribild and feels well. She does see psychiatry and see her primary physician. She does see her gynecologist for her Pap smear and was abnormal.  CD4 1120 and vl undetectable.  Denies any missed doses.  No diarrhea.     Feels left arm and leg are weak.  Finds she drops things more often and harder to pick up with lef arm than usual.     Trouble losing weight.      Review of Systems  Constitutional: Negative for fever, chills and fatigue.  HENT: Negative for sore throat and trouble swallowing.        Dry mouth  Eyes: Negative for visual disturbance.  Respiratory: Negative for shortness of breath.   Cardiovascular: Negative for leg swelling.  Gastrointestinal: Negative for diarrhea and constipation.  Skin: Negative for rash.  Neurological: Negative for dizziness and headaches.  Psychiatric/Behavioral: Negative for dysphoric mood. The patient is not hyperactive.        Objective:   Physical Exam  Constitutional: She appears well-developed and well-nourished. No distress.  HENT:  Mouth/Throat: No oropharyngeal exudate.  Eyes: Right eye exhibits no discharge. Left eye exhibits no discharge. No scleral icterus.  Cardiovascular: Normal rate, regular rhythm and normal heart sounds.   No murmur heard. Pulmonary/Chest: Effort normal and breath sounds normal. No respiratory distress. She has no wheezes.  Lymphadenopathy:    She has no cervical adenopathy.  Neurological: She is alert.  Skin: No rash noted.  Psychiatric: She has a normal mood and affect.          Assessment & Plan:

## 2013-06-07 NOTE — Assessment & Plan Note (Signed)
Doing well on regimen.  Will continue.  RtC 4 months

## 2013-06-24 ENCOUNTER — Other Ambulatory Visit: Payer: Self-pay | Admitting: Internal Medicine

## 2013-07-12 ENCOUNTER — Ambulatory Visit: Payer: Medicare Other | Admitting: Neurology

## 2013-07-13 ENCOUNTER — Encounter: Payer: Self-pay | Admitting: Neurology

## 2013-09-02 ENCOUNTER — Ambulatory Visit (INDEPENDENT_AMBULATORY_CARE_PROVIDER_SITE_OTHER): Payer: Medicare Other | Admitting: Neurology

## 2013-09-02 ENCOUNTER — Encounter: Payer: Self-pay | Admitting: Neurology

## 2013-09-02 VITALS — BP 84/62 | HR 108 | Ht 62.0 in | Wt 197.2 lb

## 2013-09-02 DIAGNOSIS — R413 Other amnesia: Secondary | ICD-10-CM

## 2013-09-02 DIAGNOSIS — F319 Bipolar disorder, unspecified: Secondary | ICD-10-CM

## 2013-09-02 DIAGNOSIS — F329 Major depressive disorder, single episode, unspecified: Secondary | ICD-10-CM

## 2013-09-02 DIAGNOSIS — D353 Benign neoplasm of craniopharyngeal duct: Secondary | ICD-10-CM

## 2013-09-02 DIAGNOSIS — F3130 Bipolar disorder, current episode depressed, mild or moderate severity, unspecified: Secondary | ICD-10-CM

## 2013-09-02 DIAGNOSIS — F32A Depression, unspecified: Secondary | ICD-10-CM

## 2013-09-02 DIAGNOSIS — D352 Benign neoplasm of pituitary gland: Secondary | ICD-10-CM

## 2013-09-02 DIAGNOSIS — F3289 Other specified depressive episodes: Secondary | ICD-10-CM

## 2013-09-02 NOTE — Patient Instructions (Addendum)
1.  Check blood work today 2.  Neuropsychiatry evaluation  3.  MRI brain wwo contrast
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
## 2013-09-03 LAB — TSH: TSH: 1.314 u[IU]/mL (ref 0.350–4.500)

## 2013-09-03 LAB — VITAMIN B12: Vitamin B-12: 558 pg/mL (ref 211–911)

## 2013-09-03 LAB — VITAMIN D 25 HYDROXY (VIT D DEFICIENCY, FRACTURES): VIT D 25 HYDROXY: 28 ng/mL — AB (ref 30–89)

## 2013-09-21 ENCOUNTER — Ambulatory Visit (HOSPITAL_COMMUNITY)
Admission: RE | Admit: 2013-09-21 | Discharge: 2013-09-21 | Disposition: A | Discharge status: Home / Self Care | Payer: Medicare Other | Source: Ambulatory Visit | Attending: Neurology | Admitting: Neurology

## 2013-09-21 ENCOUNTER — Ambulatory Visit (HOSPITAL_COMMUNITY): Admission: RE | Admit: 2013-09-21 | Payer: Medicare Other | Source: Ambulatory Visit

## 2013-09-21 DIAGNOSIS — R413 Other amnesia: Secondary | ICD-10-CM | POA: Diagnosis present

## 2013-09-21 DIAGNOSIS — D352 Benign neoplasm of pituitary gland: Secondary | ICD-10-CM | POA: Diagnosis not present

## 2013-09-21 DIAGNOSIS — D353 Benign neoplasm of craniopharyngeal duct: Secondary | ICD-10-CM | POA: Diagnosis not present

## 2013-09-21 DIAGNOSIS — F3130 Bipolar disorder, current episode depressed, mild or moderate severity, unspecified: Secondary | ICD-10-CM | POA: Diagnosis not present

## 2013-09-21 DIAGNOSIS — F329 Major depressive disorder, single episode, unspecified: Secondary | ICD-10-CM

## 2013-09-21 DIAGNOSIS — F319 Bipolar disorder, unspecified: Secondary | ICD-10-CM

## 2013-09-21 DIAGNOSIS — F32A Depression, unspecified: Secondary | ICD-10-CM

## 2013-09-21 MED ORDER — GADOBENATE DIMEGLUMINE 529 MG/ML IV SOLN
20.0000 mL | Freq: Once | INTRAVENOUS | Status: AC | PRN
  Administered 2013-09-21: 12 mL via INTRAVENOUS

## 2013-09-22 ENCOUNTER — Other Ambulatory Visit: Payer: Medicare Other

## 2013-09-23 ENCOUNTER — Telehealth: Payer: Self-pay | Admitting: Neurology

## 2013-09-23 NOTE — Telephone Encounter (Signed)
Pt called to f/u on the results for her MRI.  C/B [phone]

## 2013-09-23 NOTE — Telephone Encounter (Signed)
Called patient and discussed MRI brain results.  Neuropsych testing is pending.  Yolanda K. Posey Pronto, DO

## 2013-09-23 NOTE — Telephone Encounter (Signed)
Please advise 

## 2013-10-06 ENCOUNTER — Ambulatory Visit: Payer: Medicare Other | Admitting: Internal Medicine

## 2013-10-12 ENCOUNTER — Ambulatory Visit: Payer: Medicare Other | Admitting: Internal Medicine

## 2013-10-20 ENCOUNTER — Other Ambulatory Visit: Payer: Self-pay | Admitting: Internal Medicine

## 2013-10-20 DIAGNOSIS — B20 Human immunodeficiency virus [HIV] disease: Secondary | ICD-10-CM

## 2013-11-03 ENCOUNTER — Encounter: Payer: Self-pay | Admitting: Internal Medicine

## 2013-11-03 ENCOUNTER — Ambulatory Visit (INDEPENDENT_AMBULATORY_CARE_PROVIDER_SITE_OTHER): Payer: Medicare Other | Admitting: Internal Medicine

## 2013-11-03 ENCOUNTER — Other Ambulatory Visit (HOSPITAL_COMMUNITY)
Admission: RE | Admit: 2013-11-03 | Discharge: 2013-11-03 | Disposition: A | Discharge status: Home / Self Care | Payer: Medicare Other | Source: Ambulatory Visit | Attending: Internal Medicine | Admitting: Internal Medicine

## 2013-11-03 VITALS — BP 109/74 | HR 84 | Temp 98.1°F | Wt 190.0 lb

## 2013-11-03 DIAGNOSIS — Z113 Encounter for screening for infections with a predominantly sexual mode of transmission: Secondary | ICD-10-CM

## 2013-11-03 DIAGNOSIS — Z23 Encounter for immunization: Secondary | ICD-10-CM

## 2013-11-03 DIAGNOSIS — B20 Human immunodeficiency virus [HIV] disease: Secondary | ICD-10-CM

## 2013-11-03 NOTE — Assessment & Plan Note (Signed)
Seems to be doing well.  Will check labs today.  RTC 3-4 months.

## 2013-11-03 NOTE — Progress Notes (Signed)
  Subjective:    Patient ID: Yolanda Jones, female    DOB: [birth year], 33 y.o.   MRN: [id number]  HPI She comes in for followup of her HIV. She continues to take Stribild and feels well. She does see psychiatry and see her primary physician.  No labs prior to visit.  Was recently seen by neurology for cognitive deficits, memory loss and noted cerebral and cerebellar atrophy.  She has trouble getting words out.  States she is taking her medication.  Difficult history with a lot of difficulty concentrating.      Review of Systems  Constitutional: Negative for fever, chills and fatigue.  HENT: Negative for sore throat and trouble swallowing.        Dry mouth  Eyes: Negative for visual disturbance.  Respiratory: Negative for shortness of breath.   Cardiovascular: Negative for leg swelling.  Gastrointestinal: Negative for diarrhea and constipation.  Skin: Negative for rash.  Neurological: Negative for dizziness and headaches.  Psychiatric/Behavioral: Negative for dysphoric mood. The patient is not hyperactive.        Objective:   Physical Exam  Constitutional: She appears well-developed and well-nourished.  HENT:  Mouth/Throat: No oropharyngeal exudate.  Eyes: Right eye exhibits no discharge. Left eye exhibits no discharge. No scleral icterus.  Cardiovascular: Normal rate, regular rhythm and normal heart sounds.   No murmur heard. Pulmonary/Chest: Effort normal and breath sounds normal. No respiratory distress. She has no wheezes.  Lymphadenopathy:    She has no cervical adenopathy.  Neurological:  She is unable to complete sentences, thoughts  Skin: No rash noted.  Psychiatric:  Some odd behavior          Assessment & Plan:

## 2013-11-04 LAB — CBC WITH DIFFERENTIAL/PLATELET
Basophils Absolute: 0.1 10*3/uL (ref 0.0–0.1)
Basophils Relative: 1 % (ref 0–1)
EOS ABS: 0.3 10*3/uL (ref 0.0–0.7)
Eosinophils Relative: 6 % — ABNORMAL HIGH (ref 0–5)
HCT: 34.9 % — ABNORMAL LOW (ref 36.0–46.0)
Hemoglobin: 11.8 g/dL — ABNORMAL LOW (ref 12.0–15.0)
LYMPHS ABS: 1.8 10*3/uL (ref 0.7–4.0)
Lymphocytes Relative: 32 % (ref 12–46)
MCH: 30.9 pg (ref 26.0–34.0)
MCHC: 33.8 g/dL (ref 30.0–36.0)
MCV: 91.4 fL (ref 78.0–100.0)
MONOS PCT: 7 % (ref 3–12)
Monocytes Absolute: 0.4 10*3/uL (ref 0.1–1.0)
NEUTROS ABS: 3.1 10*3/uL (ref 1.7–7.7)
NEUTROS PCT: 54 % (ref 43–77)
PLATELETS: 336 10*3/uL (ref 150–400)
RBC: 3.82 MIL/uL — AB (ref 3.87–5.11)
RDW: 14.5 % (ref 11.5–15.5)
WBC: 5.7 10*3/uL (ref 4.0–10.5)

## 2013-11-04 LAB — COMPLETE METABOLIC PANEL WITH GFR
ALT: 15 U/L (ref 0–35)
AST: 18 U/L (ref 0–37)
Albumin: 4.1 g/dL (ref 3.5–5.2)
Alkaline Phosphatase: 94 U/L (ref 39–117)
BILIRUBIN TOTAL: 0.3 mg/dL (ref 0.2–1.2)
BUN: 10 mg/dL (ref 6–23)
CO2: 26 meq/L (ref 19–32)
CREATININE: 1.65 mg/dL — AB (ref 0.50–1.10)
Calcium: 9.8 mg/dL (ref 8.4–10.5)
Chloride: 102 mEq/L (ref 96–112)
GFR, EST AFRICAN AMERICAN: 46 mL/min — AB
GFR, Est Non African American: 40 mL/min — ABNORMAL LOW
Glucose, Bld: 92 mg/dL (ref 70–99)
Potassium: 4 mEq/L (ref 3.5–5.3)
Sodium: 136 mEq/L (ref 135–145)
Total Protein: 7.2 g/dL (ref 6.0–8.3)

## 2013-11-04 LAB — URINE CYTOLOGY ANCILLARY ONLY
CHLAMYDIA, DNA PROBE: NEGATIVE
Neisseria Gonorrhea: NEGATIVE

## 2013-11-04 LAB — RPR

## 2013-11-04 LAB — T-HELPER CELL (CD4) - (RCID CLINIC ONLY)
CD4 T CELL HELPER: 47 % (ref 33–55)
CD4 T Cell Abs: 890 /uL (ref 400–2700)

## 2013-11-07 LAB — HIV-1 RNA QUANT-NO REFLEX-BLD: HIV 1 RNA Quant: <20 copies/mL (ref <20)

## 2013-11-09 ENCOUNTER — Encounter: Payer: Self-pay | Admitting: *Deleted

## 2013-12-09 ENCOUNTER — Telehealth: Payer: Self-pay | Admitting: *Deleted

## 2013-12-09 NOTE — Telephone Encounter (Signed)
Patient called asking about a referral to Neurology. She was very confused and thought Dr. Linus Salmons was making this referral. She was already seen by Neuro and per a phone note from August stated their office was going to refer her to Neuropsych. Called [facility] [phone] for patient and they are forwarding a note to Dr. Posey Pronto and his nurse Caryl Pina to find out the status of this referral. Patient notified Yolanda Jones

## 2013-12-09 NOTE — Telephone Encounter (Signed)
Patient has question about her referral to neuropsy eval. Please advise

## 2013-12-12 ENCOUNTER — Encounter: Payer: Self-pay | Admitting: Internal Medicine

## 2013-12-12 NOTE — Telephone Encounter (Signed)
Called patient back and left message for her to call me when she can.

## 2013-12-12 NOTE — Telephone Encounter (Signed)
Pt returning call to [place]. Confirmed CB# [phone] / Sherri S.

## 2013-12-12 NOTE — Telephone Encounter (Signed)
Patient said that she saw her psychiatrist recently and he switched her medicine.  She wants to continue to see him and hold off on neuropsych testing at this time.

## 2013-12-13 NOTE — Telephone Encounter (Signed)
FYI

## 2013-12-13 NOTE — Telephone Encounter (Signed)
Noted.  Please cancel neuropsych eval.  Donika K. Posey Pronto, DO

## 2013-12-13 NOTE — Telephone Encounter (Signed)
Appointment cancelled

## 2013-12-19 ENCOUNTER — Telehealth: Payer: Self-pay | Admitting: *Deleted

## 2013-12-19 ENCOUNTER — Other Ambulatory Visit: Payer: Self-pay | Admitting: Internal Medicine

## 2013-12-19 NOTE — Telephone Encounter (Signed)
Patient called, very confused.  She needed clarification about her neuropsy appointment.  This is being handled by her neurologist, Dr. Posey Pronto.  RN contacted Caryl Pina with Dr. Serita Grit office to help patient. Landis Gandy, RN

## 2013-12-19 NOTE — Telephone Encounter (Signed)
Opened in error

## 2014-02-20 ENCOUNTER — Other Ambulatory Visit: Payer: Medicare Other

## 2014-03-06 ENCOUNTER — Ambulatory Visit: Payer: Medicare Other | Admitting: Internal Medicine

## 2014-04-03 ENCOUNTER — Ambulatory Visit: Payer: Medicare Other | Admitting: Internal Medicine

## 2014-04-03 ENCOUNTER — Telehealth: Payer: Self-pay | Admitting: *Deleted

## 2014-04-03 NOTE — Telephone Encounter (Signed)
Left message notifying patient of missed appointment, the need for follow up. Landis Gandy, RN

## 2014-04-10 ENCOUNTER — Ambulatory Visit: Payer: Medicare Other | Attending: Psychology | Admitting: Psychology

## 2014-04-10 DIAGNOSIS — F09 Unspecified mental disorder due to known physiological condition: Secondary | ICD-10-CM

## 2014-04-10 DIAGNOSIS — F3162 Bipolar disorder, current episode mixed, moderate: Secondary | ICD-10-CM

## 2014-04-10 NOTE — Progress Notes (Addendum)
Initial Contact Note  Name: Yolanda Jones MRN: [id number] Date: 04/10/2014  Yolanda Jones is an 34 y.o. right handed female who was referred for neuropsychological evaluation by Narda Amber, DO of [facility] due to problems with cognitive functioning.   A total of 7 hours was spent today reviewing medical records, interviewing (CPT [id number]) Yolanda Jones, administering and scoring neurocognitive tests (CPT [id number] & [phone]) and preparing a written report.  There were no concerns expressed or behaviors displayed by Yolanda Jones that would require immediate attention.   Plan to discuss her case with her psychiatric providers (she gave written consent) as part of the evaluative process.  Full report to follow.   Jamey Ripa, Ph.D Licensed Psychologist 04/10/2014     ______________________________________________________________________  Jamey Ripa, Ph.D [facility]  [address]   Telephone [phone] [address] Fax [phone] [place], [place] [address]   [place]* This report should not be released without the consent of the client  Name:   Yolanda Jones Date of Birth:  1980-05-27 [facility] MR#:  [id number] Dates of Evaluation: 04/10/14       Reason for Referral Yolanda Jones is a 34 year-old right-handed woman who was referred for neuropsychological evaluation by Narda Amber, DO of [facility] as prompted by signs of cognitive decline within the past year. She was initially referred to neurology due to her report of episodic bilateral arm and leg weakness that began around the spring of 2015. A brain MRI scan on 09/21/13 showed cerebral and cerebellar atrophy that was deemed premature for her age. Her past medical history was notable for pituitary adenoma with hyperprolactinemia, hyperthyroidism, HIV+ since 2005, multiple psychiatric diagnoses (i.e., Bipolar Disorder  with Psychotic Features, Posttraumatic Stress Disorder, Generalized Anxiety Disorder and Personality Disorder with Borderline features) and history of polysubstance abuse currently in sustained remission.    Sources of Information Medical Records from [place] records were reviewed. Yolanda Jones and her mother, Ms. Yolanda Jones, were interviewed. Her case was discussed with her psychotherapist, Ms. Yolanda Jones, with Ms. Adah Salvage consent.  Chief Complaints Yolanda Jones was able to provide only limited information. She claimed to have little to no recall of the events from the past few months or even the prior day. Sometimes she can recall discrete events if given cues. She reported that she is distractible, easily loses her train of thought and experiences thought blocking. She described her mood as mildly depressed, specifically in reaction to her cognitive difficulties. She denied experiencing apathy, racing thoughts, mood swings, suicidal or homicidal ideation, auditory or visual hallucinations and delusional ideas.  Her mother reported that her daughter's everyday memory and expressive communication skills sharply declined sometime in the spring of 2015. She gave examples of her not recalling recent conversations or daily events, leaving bread in the toaster oven, pouring pancake mix in a pan and not knowing what to do next, making tangential or nonsensical  comments during conversation and stating inaccurate beliefs (e.g., that her nephew was in jail). In contrast, she has been able to consistently recognize familiar persons, dress herself and properly use household appliances. She did not observe any change in her daughter's mood coincident with her cognitive decline. She reported that her daughter's cognitive functioning had somewhat improved after her psychiatric provider discontinued alprazolam and clonazepam. In addition, at that time she was asked to assume responsibility  for her daughter's medication administration. Currently, she reported  that her daughter continues to be forgetful and will speak in a rambling manner, especially in the evening.  According to her psychotherapist Ms. Yolanda Jones, Yolanda Jones demonstrated a dramatic change in her cognitive status in mid-2015. She came to a counseling session and demonstrated an inability to keep track of the conversation or to express her thoughts logically. Yolanda Jones, who has treated Yolanda Jones since 2013, stated that her cognitive functioning had appeared to be normal and her mood had been stable for some time prior to this abrupt cognitive change. Her drug screens were negative for substance of abuse. After her mother assumed responsibility for medication administration, Yolanda Jones cognitive status appeared a bit improved though not close to baseline. She reported that in the weeks following this cognitive change, Yolanda Jones seemed to become depressed, which she attributed to her reaction to her cognitive decline.   Observations She appeared as an appropriately dressed and groomed woman in no apparent physical distress. She had a lip rig. She interacted in a congenial and cooperative manner. There were no signs of unusual mannerisms or motor activity. She appeared alert though inattentive at times. She requested frequent repetition of questions posed to her. She had difficulty finding words completing her sentences in a coherent manner. No problems were evident for speech articulation or prosody. Her comprehension of language seemed constrained by her shortened attention span. Her affect appeared within a wide range with mild lability. She did not display signs of emotional distress. Her thought processes were easily derailed though without loose associations, verbal perseverations or flight of ideas. She did not appear to be responding to internal stimuli.  Her thought content was devoid of unusual or bizarre  ideas.  Background She currently lives in a trailer with her adoptive mother and her mother's grandson. Yolanda Jones was briefly married for one month but has since been estranged from her husband. She has no children.  Ms. Charlott Holler adopted Ms. Cope in infancy. She described Ms. Delamora biological mother as homeless when she gave her daughter up for adoption. Ms. Amrhein has had no contact with her biological mother for many years.  Ms. Charlott Holler reported that her daughter attained her developmental milestones within normal timeframes. There was no report of any unusual childhood illnesses or injuries.   Ms. Kerrigan reported that she was graduated from high school though had ongoing problems with attention and learning. She reported that she never was left back in grade, underwent psychoeducational evaluation nor received special education services.   She had worked for two [employer] over a few months until going on disability in 2005 due to her mental health issues.   Ms. Dueitt reported that for the past few years she has spent most of her time cleaning house or surfing the Internet. She reported having no friends.   Her medical history was notable for asthma, pituitary adenoma with hyperprolactinemia, hyperthyroidism, HIV+ since 2005 (on highly active antiretroviral therapy), right ankle pain and knee pain. Her current medications include amphetamine-dextroamphetamine, benztropine, clonazepam as needed, esomeprazole, paliperidone, sertraline, valacyclovir and zolpidem.   She reported no history of head injury. She reported having had one seizure as an adult due to taking too much pain medication.  She reported a history of alcohol and polysubstance abuse (i.e., cocaine, OxyContin) though maintained that she has been clean and sober for the past six years.   She has been diagnosed with multiple psychiatric disorders, including Posttraumatic Stress Disorder, Generalized Anxiety Disorder,  Bipolar Disorder  with Psychotic Features and Personality Disorder with Borderline features. According to her mother, she first displayed mood problems when she was 34 years old shortly after she found the body of her nine year cousin who had committed suicide by hanging. She began superficially cutting herself while in high school. Medical records indicated psychiatric hospitalizations in 2006 and 2011, the latter for a suicide attempt via overdose. She has been treated by [name], NP since at least 2011 and by Yolanda Jones, psychotherapist, since 2013.   Tests Administered Animal Naming Test  [place] Naming Test Brief Neuropsychological Cognitive Examination  Brief Symptom Inventory Finger Tapping Test Controlled Oral Word Association Test Rey Complex Figure: Copy Rey 15-Item Memory Test Trail Making A & B Wechsler Adult Intelligence Scale-IV: Music therapist, Coding, Digit Span, Matrix Reasoning & Similarities  Wechsler Memory Scale-IV: Flexible Approach Battery & Symbol Span Wide Range Achievement Test-4: Word Reading  Assessment Results Observations & Test Validity Throughout test administration, this pleasant and cooperative woman exhibited signs of emotional dysregulation as manifested by affective lability (i.e., propensity to easily cry or laugh) and anxious reactions marked by rapid breathing and flushed face to some tests. For instance, she reported feeling anxious for reasons she could not identify while performing a test that measured how fast she could tap a metal counter with her finger. Her attentional control was deficient as she reported being easily distracted by ambient noise and exhibited multiple lapses of attention (e.g., she often exclaimed "what?" immediately upon test instructions being given) as well as difficulty maintaining set ("What was I supposed to do"?; midway thru a test of semantic fluency that required her to name animals, she abruptly switched to the  instructions of a previous fluency test that required naming words to a designated letter). Her response speed to tasks that required either a graphic or verbal response tended to be very slow. For example, she was able to copy only a few aspects of a complex geometric design (Rey Complex Figure) despite working on it for ten minutes. A test that required set shifting (Trails B) was discontinued as she could not perform it within a reasonable amount of time. She did not report or display problems with vision (she wore her eyeglasses), hearing or motor skills.   She performed at the cut-off (i.e., nine of fifteen symbols) on a test of effort (Rey 15-Item Memory Test) that required her to draw fifteen symbols immediately after viewing them on a page. The symbols can be relatively easily memorized as there is redundancy amongst them that reduces the amount of information to be remembered.   Test Scores & Interpretation Her test scores were corrected to reflect norms for her age and, whenever possible, her gender and educational level (i.e., 12 years). A listing of test scores can be found at the end of this report.   The majority of her performances on the neuropsychological test battery fell within the subnormal range.   On a standardized screening of major cognitive functions typically affected by neurological and psychiatric conditions (Brief Neuropsychological Cognitive Examination), her total score of 21/30 fell at the upper limit of the moderate range of impairment. In most cases, persons with scores within this range do not possess sufficient cognitive capacity to live independently. She demonstrated difficulties primarily on tests of attentional control, set shifting, constructive praxis and verbal reasoning.   More in-depth cognitive testing indicated that her memory (Wechsler Memory Scale-IV (WMS-IV) Flexible Approach) was severely impaired  as measures of her immediate  and delayed memory of verbal  (i.e., stories) and visual (i.e., figural designs) information fell below the 1st percentile. After an approximate thirty minute delay, she exhibited a high rate of forgetting of the relatively small amount of information that she had initially encoded. Her performances on a recognition format were also impaired as she reported that she was mostly guessing. She also demonstrated severe impairment (i.e., 1st percentile or lower) on tests of visual scanning/simple sequencing speed (Trails A) and category fluency Geologist, engineering Test). As noted above, tests of set shifting and complex spatial drawing were discontinued due to her apparent inability to complete them.  She performed well below normal within the Borderline range (i.e., 2nd to 8th percentile) on tests of visual processing speed (WAIS-IV Coding), visual working memory span Geophysicist/field seismologist Span), auditory working memory (WAIS-IV Digit Span Sequencing), naming to confrontation [employer]) and visuospatial assembly Product manager).  She scored within the Low Average range (i.e., 9th to 24th percentile) on tests of immediate auditory attention span (WAIS-IV Digit Span Forward), phonemic fluency (Controlled Oral Word Association Test) and nonverbal reasoning (WAIS-IV Matrix Reasoning).  Her only scores within the Average range were on tests of word reading (Wide Range Achievement Test-4: Reading) and verbal reasoning (WAIS-IV Similarities).  Emotional Functioning On the Brief Symptom Inventory (BSI), a 53-item self-report questionnaire listing symptoms of physical and psychological distress, she reported a highly elevated level of global distress within the past week as compared to a sample of female non-patients. Her most elevated BSI subscale was comprised of symptoms related to cognitive difficulties. All of her BSI subscales were within the clinically elevated range. These scales reflected symptoms of depression, low self-esteem, social  alienation, generalized anxiety, phobic anxiety, somatic complaints, anger/hostility and paranoid/suspicious thinking. Examination of her individual responses indicated that the symptoms causing her the most distress were all related to cognitive difficulty.  Summary & Conclusions Jenevieve Kirschbaum is a 35 year-old woman with a history of longtime psychiatric disorder, HIV+ and polysubstance abuse in remission who demonstrated a striking decline in her cognitive functioning sometime in the spring of 2015. According to her psychotherapist, her cognitive functioning had appeared normal and her mood stable for some time prior to this abrupt cognitive change. After benzodiazepine medication was discontinued and her mother assumed responsibility for medication administration, Ms. Tyrell cognitive status appeared to improve a bit though not close to baseline.  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 Observations of this pleasant and cooperative woman were notable for mental slowing, multiple lapses of attention, easily derailed thought processes, frequent word finding pauses, difficulty completing sentences in a coherent manner, tendency to lose set in the midst of a task, labile affect and anxious mood. She did not report or display psychotic symptomology. Persons with her neuropsychological profile would have great difficulty functioning at an independent level.  The etiology of her cognitive decline is unclear. While persons with chronic psychiatric disorders often demonstrate cognitive deficits, it is worth noting that her cognitive functioning reportedly suddenly declined in the absence of changes in her psychiatric status or level of life stress. Substance abuse is probably not at issue as she was reportedly regularly drug tested during 2015; and she claimed that she has been clean and sober for the past six  years. It is possible that she had experienced some cognitive disruption from having unintentionally taken her prescription medications incorrectly for a period of time in 2015 though her cognitive functioning  has yet to rebound much since her mother assumed responsibility for medication administration. There were no indications that she was attempting to feign cognitive impairment. No secondary gain factors that might maintain cognitive incapacity were identified. Finally, a neuropathological cause might be suggested by indications of cerebral and cerebellar atrophy premature for her age on a recent brain MRI scan. Her medical history would raise the possibility of an HIV-related encephalopathy.  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 The importance of follow-up with her physicians was emphasized to her. She was advised to also follow-up with her neurologist as planned.   I have appreciated the opportunity to evaluate Ms. Ronnald Ramp. Please feel free to contact me with any comments or questions.    __________________ Antionette Poles, Ph.D Licensed Psychologist    ADDENDUM-NEUROPSYCHOLOGICAL TEST RESULTS                      Name:   Edmonia Lynch  Date of Evaluation: 04/10/14                     Animal Naming Test   Score= 1 1st (adjusted for age, gender and educational level)   [place] Naming Test   Score= [phone] 3rd (adjusted for age, gender and educational level)   Brief Neuropsychological Cognitive Examination Part I                      Range of Impairment  Orientation    None  Presidential Memory   None  Naming    Mild  Comprehension   None  Constructive Praxis   Moderate  Part II                        Shifting Set    Moderate  Incomplete  Pictures   None  Similarities    Moderate   Attention    Moderate  Working Memory   None   Controlled Oral Word Association Test Score= 30  14th (adjusted for age, gender and educational level)   Rey 15-Item Memory Test   Score= 9/15 at cut-off   Trails A         56s  0e 1st (adjusted for age, gender and educational level) Trails B            Discontinued after five minutes/patient unable to complete   Wechsler Adult Intelligence Scale-IV (WAIS-IV)            SS        percentile  Block Design    4     2nd    Similarities    8      25th  Digit Span    4     2nd  Matrix Reasoning   6     9th   Coding     4     2nd       Wechsler Memory Scale-IV Flexible Approach Index                        Index Score    Percentile            Immediate Memory    46  <1st  Auditory Memory    47  <1st                               Visual Memory    45  <1st                             Delayed Memory    40  <1st      Wechsler Memory Scale-IV  Symbol Span      2nd    Wide Range Achievement Test-4  Subtest            raw score      standard score  %rank  Word Reading  61/70   99     47th

## 2014-04-13 ENCOUNTER — Other Ambulatory Visit: Payer: Self-pay | Admitting: Internal Medicine

## 2014-04-13 DIAGNOSIS — B20 Human immunodeficiency virus [HIV] disease: Secondary | ICD-10-CM

## 2014-04-17 ENCOUNTER — Encounter: Payer: Self-pay | Admitting: Internal Medicine

## 2014-04-17 ENCOUNTER — Ambulatory Visit (INDEPENDENT_AMBULATORY_CARE_PROVIDER_SITE_OTHER): Payer: Medicare Other | Admitting: Internal Medicine

## 2014-04-17 VITALS — BP 102/71 | HR 87 | Temp 98.4°F | Wt 182.0 lb

## 2014-04-17 DIAGNOSIS — B20 Human immunodeficiency virus [HIV] disease: Secondary | ICD-10-CM

## 2014-04-17 DIAGNOSIS — Z21 Asymptomatic human immunodeficiency virus [HIV] infection status: Secondary | ICD-10-CM

## 2014-04-17 NOTE — Addendum Note (Signed)
Addended by: Dolan Amen D on: 04/17/2014 02:51 PM   Modules accepted: Orders

## 2014-04-17 NOTE — Addendum Note (Signed)
Addended by: Thayer Headings on: 04/17/2014 02:43 PM   Modules accepted: Orders

## 2014-04-17 NOTE — Assessment & Plan Note (Signed)
She is doing well with her medication by her report. I will check her labs today and she'll return in 4-5 months if she is doing well.

## 2014-04-17 NOTE — Progress Notes (Signed)
  Subjective:    Patient ID: Yolanda Jones, female    DOB: 1980/06/01, 34 y.o.   MRN: [id number]  HPI She comes in for followup of her HIV. She continues to take Stribild and feels well. She does see psychiatry and see her primary physician.  No labs prior to visit.  States she is taking her medication.     Review of Systems  Constitutional: Negative for fever, chills and fatigue.  HENT: Negative for sore throat and trouble swallowing.        Dry mouth  Eyes: Negative for visual disturbance.  Respiratory: Negative for shortness of breath.   Cardiovascular: Negative for leg swelling.  Gastrointestinal: Negative for diarrhea and constipation.  Skin: Negative for rash.  Neurological: Negative for dizziness and headaches.  Psychiatric/Behavioral: Negative for dysphoric mood. The patient is not hyperactive.        Objective:   Physical Exam  Constitutional: She appears well-developed and well-nourished.  HENT:  Mouth/Throat: No oropharyngeal exudate.  Eyes: Right eye exhibits no discharge. Left eye exhibits no discharge. No scleral icterus.  Cardiovascular: Normal rate, regular rhythm and normal heart sounds.   No murmur heard. Pulmonary/Chest: Effort normal and breath sounds normal. No respiratory distress. She has no wheezes.  Lymphadenopathy:    She has no cervical adenopathy.  Skin: No rash noted.  Psychiatric:  redirectable          Assessment & Plan:

## 2014-04-18 LAB — HIV-1 RNA QUANT-NO REFLEX-BLD
HIV 1 RNA Quant: <20 copies/mL (ref <20)
HIV-1 RNA Quant, Log: <1.3 {Log} (ref <1.30)

## 2014-04-18 LAB — T-HELPER CELL (CD4) - (RCID CLINIC ONLY)
CD4 T CELL ABS: 820 /uL (ref 400–2700)
CD4 T CELL HELPER: 46 % (ref 33–55)

## 2014-04-25 ENCOUNTER — Other Ambulatory Visit: Payer: Self-pay | Admitting: Internal Medicine

## 2014-05-14 ENCOUNTER — Other Ambulatory Visit: Payer: Self-pay | Admitting: Internal Medicine

## 2014-05-17 ENCOUNTER — Telehealth: Payer: Self-pay | Admitting: Psychology

## 2014-05-17 NOTE — Telephone Encounter (Signed)
Ms. Gear found some paperwork from my office and called to see if she had ever been in to see me. I briefly reviewed the findings from her neuropsychological evaluation of 04/10/14. She reported having only a vague recollection of that visit. I advised her to follow-up with Dr. Posey Pronto, the neurologist who referred her to me. She had no recollection of Dr. Posey Pronto. I gave her the phone number for [facility].

## 2014-05-24 ENCOUNTER — Ambulatory Visit (INDEPENDENT_AMBULATORY_CARE_PROVIDER_SITE_OTHER): Payer: Medicare Other | Admitting: Neurology

## 2014-05-24 ENCOUNTER — Encounter: Payer: Self-pay | Admitting: Neurology

## 2014-05-24 VITALS — BP 100/74 | HR 98 | Ht 62.0 in | Wt 184.5 lb

## 2014-05-24 DIAGNOSIS — G3184 Mild cognitive impairment, so stated: Secondary | ICD-10-CM

## 2014-05-24 DIAGNOSIS — F99 Mental disorder, not otherwise specified: Secondary | ICD-10-CM

## 2014-05-24 DIAGNOSIS — B20 Human immunodeficiency virus [HIV] disease: Secondary | ICD-10-CM

## 2014-05-24 DIAGNOSIS — R413 Other amnesia: Secondary | ICD-10-CM

## 2014-05-24 LAB — VITAMIN B12: VITAMIN B 12: 324 pg/mL (ref 211–911)

## 2014-05-24 LAB — TSH: TSH: 3.246 u[IU]/mL (ref 0.350–4.500)

## 2014-05-24 LAB — C-REACTIVE PROTEIN

## 2014-05-24 NOTE — Patient Instructions (Signed)
1.  Check blood work 2.  Follow-up with your psychiatrist to see if medication changes will help memory.  I also recommend that you reestablish care with a therapist

## 2014-05-24 NOTE — Progress Notes (Signed)
Follow-up Visit   Date: 05/24/2014    Yolanda Jones MRN: [id number] DOB: 12-25-1980   Interim History: Yolanda Jones is a 34 y.o. right-handed Caucasian female with pituitary adenoma with hyperprolactinoma, HIV (diagnosed 2010, on HAART), PTSD, GAD, bipolar disorder, and hyperthyroidism returning to the clinic for follow-up of memory impairment.  The patient was accompanied to the clinic by her mother who also provides collateral information.    History of present illness: Initial presentation 09/02/2013 She noticed difficulty with short and long term memory especially over the past several months. She forgets details of conversation and finds herself asking her friends and family members to repeat things and remind her. She is "really concerned for my future". She denies any auditory or visual hallucination. Her mood is good and is seeing Dr. [name] ([place]) and is seeing a therapist once every few weeks. She does not have transportation and is trying to use [employer] bus.   She is repeating things, forgetting details of conversation, misplacing items, word-finding difficulties, and cannot multitask effectively.    UPDATE 05/24/2014:  She does not why she is here today.  She underwent neuropsychiatric testing in which found cognitive deficits in multiple domains and ultimately was diagnosed with unspecified cognitive disorder.  Her mother does report that her memory and behavior has significant improved since the summer of 2015 and attributes this to medication adjustment and her mother managing her medications.  She was taken off xanax 5m and switched to 0.523mprn for panic attacks. However, there patient still experiences memory problems daily.  For instance, her mother says that she can [place] items.  She cannot manage her own medications and is not driving.  She can walk into a room and forget why  she was there.  She cannot recall recent events.  She denies any hallucinations, but does endorose having psychotic break as "if you're on acid". She is not having psychotherapy because of travel difficulties.    Medications:  Current Outpatient Prescriptions on File Prior to Visit  Medication Sig Dispense Refill  . amphetamine-dextroamphetamine (ADDERALL) 20 MG tablet Take 20 mg by mouth 3 (three) times daily.    . benztropine (COGENTIN) 2 MG tablet Take 2 mg by mouth daily.    . Marland Kitchensomeprazole (NEXIUM) 20 MG capsule Take 40 mg by mouth daily before breakfast.     . Multiple Vitamin (MULTIVITAMIN WITH MINERALS) TABS tablet Take 1 tablet by mouth daily.    . Marland Kitchenmega-3 acid ethyl esters (LOVAZA) 1 G capsule Take 1 g by mouth daily.    . paliperidone (INVEGA) 6 MG 24 hr tablet Take 3 mg by mouth every morning.     . Marland KitchenRESCRIPTION MEDICATION 1 each. Paraguard IUD placed in 2006    . sertraline (ZOLOFT) 100 MG tablet Take 100 mg by mouth daily.    . STRIBILD 150-150-200-300 MG TABS tablet TAKE 1 TABLET BY MOUTH EVERY DAY 30 tablet 0  . valACYclovir (VALTREX) 500 MG tablet TAKE 1 TABLET BY MOUTH EVERY 12 HOURS FOR 3 DAYS 6 tablet 5  . zolpidem (AMBIEN) 10 MG tablet Take 20 mg by mouth at bedtime as needed.       No current facility-administered medications on file prior to visit.    Allergies:  Allergies  Allergen Reactions  . Meloxicam   . Oxaprozin   . Strattera [Atomoxetine Hcl]   . Tramadol  Review of Systems:  CONSTITUTIONAL: No fevers, chills, night sweats, or weight loss.  EYES: No visual changes or eye pain ENT: No hearing changes.  No history of nose bleeds.   RESPIRATORY: No cough, wheezing and shortness of breath.   CARDIOVASCULAR: Negative for chest pain, and palpitations.   GI: Negative for abdominal discomfort, blood in stools or black stools.  No recent change in bowel habits.   GU:  No history of incontinence.   MUSCLOSKELETAL: No history of joint pain or swelling.  No  myalgias.   SKIN: Negative for lesions, rash, and itching.   ENDOCRINE: Negative for cold or heat intolerance, polydipsia or goiter.   PSYCH:  ++ depression or anxiety symptoms.   NEURO: As Above.   Vital Signs:  BP 100/74 mmHg  Pulse 98  Ht $R'5\' 2"'Xb$  (1.575 m)  Wt 184 lb 8 oz (83.689 kg)  BMI 33.74 kg/m2  SpO2 98%   General: Poor attention, answers questions appropriately but can go off on a tangent Neck: no carotid bruit CV: RRR Ext: No edema, old scars on her forearms from cutting behavior  Neurological Exam: MENTAL STATUS including orientation to time, place, person is intact.  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  Current events:  HB2 law in [place], "our governor is screwing [place]".  Attention span and concentration is poor.  She appears very scattered and restless.  Speech is not dysarthric, but she can have tangential thought process.  CRANIAL NERVES: No visual field defects.  Pupils equal round and reactive to light.  Normal conjugate, extra-ocular eye movements in all directions of gaze.  No ptosis. Normal facial sensation.  Face is symmetric. Palate elevates symmetrically.  Tongue is midline.  No pathological facial reflexes.   MOTOR:  Motor strength is 5/5 in all extremities.  No atrophy, fasciculations or abnormal movements.  No pronator drift.  Tone is normal.    MSRs:  Reflexes are 3+/4 throughout, except 2+/4 at the Achilles bilaterally.  SENSORY:  Intact to vibration.  COORDINATION/GAIT:  Normal finger-to- nose-finger and heel-to-shin.  Intact rapid alternating movements bilaterally.  Gait narrow based and stable.   Data: MRI brain wwo contrast 09/21/2013: Premature for age cerebral and cerebellar atrophy. No acute intracranial findings.  Motion degraded pituitary sequences demonstrate normal gland height without significant areas of differential enhancement. Today's scan resembles most closely the appearance of the brain on 05/25/2007 where no definite abnormality was observed.   MRI  lumbar without contrast spine 10/29/2010: 1. Interval development a shallow right foraminal disc protrusion at L3-L4 causing possible right L3 nerve root encroachment.  2. Interval development of left foraminal disc protrusion at L4-L5 causing possible left L4 nerve root encroachment.  3. Interval development of right paracentral disc protrusion at L5- S1 without sacral or exiting L5 nerve root encroachment.  MRI brain with and without contrast 05/25/2007: Normal appearing brain  Stable appearance of the pituitary gland. 3 mm focus in the inferior aspect of the gland previously was questioned as a possible microadenoma. I am not sure of that, but certainly there has not been any change or progression.  XR cervical spine 03/08/2003: No acute abnormality  Lab Results  Component Value Date   VITAMINB12 558 09/02/2013    IMPRESSION: Yolanda Jones is a 34 year old female with psychiatric disorder, HIV positive, and polysubstance abuse returning for evaluation of cognitive impairment. Although her mother notes some improvement of her behavior and cognition since summer 2015, she continues to have moderate difficulties with short-term memory and performing IADLs.  MRI brain shows premature atrophy which can be seen in individual siwht long history of psychotropic medications.  Neuropsycological testing shows unspecifified cognitive disorder. HIV dementia is unlikely especially with the fluctuating course of symptoms and side effects from medications (currently taking adderall, Cogentin, ativan, and Zoloft) as well as mood disorder and psychological stresses of the most likely etiologies for memory problems.  Less likely possibilities including autoimmune encephalitis (?NMDA encephalitis) was discussed, including performing a spinal tap to be sure we are not missing alterative diagnosis, however she would like to try to see if her psychiatric health can be optimized.  If there is no improvement or worsening of  memory, low threshold to obtain CSF testing.  In the meantime, I will screen for other causes of progressive memory changes   PLAN/RECOMMENDATIONS:  1.  Check ESR, CRP, vitamin B12, vitmain E, heavy metal screen, TSH 2.  Encouraged to follow-up with psychiatry and start seeing counselor 3.  Return to clinic as needed.      The duration of this appointment visit was 35 minutes of face-to-face time with the patient.  Greater than 50% of this time was spent in counseling, explanation of diagnosis, planning of further management, and coordination of care.   Thank you for allowing me to participate in patient's care.  If I can answer any additional questions, I would be pleased to do so.    Sincerely,    Donika K. Posey Pronto, DO

## 2014-05-25 LAB — SEDIMENTATION RATE: Sed Rate: 22 mm/hr — ABNORMAL HIGH (ref 0–20)

## 2014-05-26 LAB — HEAVY METALS, BLOOD
Arsenic: <3 mcg/L (ref <23)
Lead: <2 ug/dL (ref <10)
Mercury, B: <4 mcg/L (ref <=10)

## 2014-05-27 LAB — VITAMIN E
GAMMA-TOCOPHEROL (VIT E): 1.5 mg/L (ref <=4.3)
Vitamin E (Alpha Tocopherol): 12.2 mg/L (ref 5.7–19.9)

## 2014-06-09 ENCOUNTER — Other Ambulatory Visit: Payer: Self-pay | Admitting: Internal Medicine

## 2014-06-09 DIAGNOSIS — B20 Human immunodeficiency virus [HIV] disease: Secondary | ICD-10-CM

## 2014-06-13 ENCOUNTER — Other Ambulatory Visit: Payer: Self-pay | Admitting: *Deleted

## 2014-06-13 DIAGNOSIS — B20 Human immunodeficiency virus [HIV] disease: Secondary | ICD-10-CM

## 2014-06-13 MED ORDER — ELVITEG-COBIC-EMTRICIT-TENOFDF 150-150-200-300 MG PO TABS: 1.0000 | ORAL_TABLET | Freq: Every day | ORAL | Status: DC

## 2014-08-17 ENCOUNTER — Ambulatory Visit (INDEPENDENT_AMBULATORY_CARE_PROVIDER_SITE_OTHER): Payer: Medicare Other | Admitting: Internal Medicine

## 2014-08-17 ENCOUNTER — Other Ambulatory Visit (HOSPITAL_COMMUNITY)
Admission: RE | Admit: 2014-08-17 | Discharge: 2014-08-17 | Disposition: A | Discharge status: Home / Self Care | Payer: Medicare Other | Source: Ambulatory Visit | Attending: Internal Medicine | Admitting: Internal Medicine

## 2014-08-17 ENCOUNTER — Encounter: Payer: Self-pay | Admitting: Internal Medicine

## 2014-08-17 VITALS — BP 104/71 | HR 77 | Temp 98.1°F | Wt 183.0 lb

## 2014-08-17 DIAGNOSIS — B20 Human immunodeficiency virus [HIV] disease: Secondary | ICD-10-CM

## 2014-08-17 DIAGNOSIS — Z79899 Other long term (current) drug therapy: Secondary | ICD-10-CM

## 2014-08-17 DIAGNOSIS — Z113 Encounter for screening for infections with a predominantly sexual mode of transmission: Secondary | ICD-10-CM | POA: Diagnosis present

## 2014-08-17 LAB — CBC WITH DIFFERENTIAL/PLATELET
BASOS PCT: 1 % (ref 0–1)
Basophils Absolute: 0.1 10*3/uL (ref 0.0–0.1)
EOS ABS: 0.3 10*3/uL (ref 0.0–0.7)
EOS PCT: 6 % — AB (ref 0–5)
HCT: 34.8 % — ABNORMAL LOW (ref 36.0–46.0)
HEMOGLOBIN: 11.4 g/dL — AB (ref 12.0–15.0)
Lymphocytes Relative: 41 % (ref 12–46)
Lymphs Abs: 2.3 10*3/uL (ref 0.7–4.0)
MCH: 30.7 pg (ref 26.0–34.0)
MCHC: 32.8 g/dL (ref 30.0–36.0)
MCV: 93.8 fL (ref 78.0–100.0)
MONOS PCT: 7 % (ref 3–12)
MPV: 10.2 fL (ref 8.6–12.4)
Monocytes Absolute: 0.4 10*3/uL (ref 0.1–1.0)
Neutro Abs: 2.5 10*3/uL (ref 1.7–7.7)
Neutrophils Relative %: 45 % (ref 43–77)
Platelets: 371 10*3/uL (ref 150–400)
RBC: 3.71 MIL/uL — AB (ref 3.87–5.11)
RDW: 14.4 % (ref 11.5–15.5)
WBC: 5.5 10*3/uL (ref 4.0–10.5)

## 2014-08-17 LAB — COMPLETE METABOLIC PANEL WITH GFR
ALK PHOS: 81 U/L (ref 39–117)
ALT: 9 U/L (ref 0–35)
AST: 15 U/L (ref 0–37)
Albumin: 4 g/dL (ref 3.5–5.2)
BILIRUBIN TOTAL: 0.3 mg/dL (ref 0.2–1.2)
BUN: 12 mg/dL (ref 6–23)
CO2: 24 meq/L (ref 19–32)
Calcium: 9.5 mg/dL (ref 8.4–10.5)
Chloride: 104 mEq/L (ref 96–112)
Creat: 1.41 mg/dL — ABNORMAL HIGH (ref 0.50–1.10)
GFR, Est African American: 55 mL/min — ABNORMAL LOW
GFR, Est Non African American: 48 mL/min — ABNORMAL LOW
Glucose, Bld: 88 mg/dL (ref 70–99)
Potassium: 4.2 mEq/L (ref 3.5–5.3)
Sodium: 138 mEq/L (ref 135–145)
Total Protein: 6.9 g/dL (ref 6.0–8.3)

## 2014-08-17 LAB — LIPID PANEL
CHOL/HDL RATIO: 6.6 ratio
Cholesterol: 226 mg/dL — ABNORMAL HIGH (ref 0–200)
HDL: 34 mg/dL — ABNORMAL LOW (ref >=46)
LDL CALC: 163 mg/dL — AB (ref 0–99)
Triglycerides: 147 mg/dL (ref <150)
VLDL: 29 mg/dL (ref 0–40)

## 2014-08-17 NOTE — Assessment & Plan Note (Signed)
Doing well on Stribild.  Labs today and rTC 6 months unless concerns..  Discussed possibility of sexual activity and offered and provided condoms.

## 2014-08-17 NOTE — Progress Notes (Signed)
  Subjective:    Patient ID: Yolanda Jones, female    DOB: 04-13-1980, 34 y.o.   MRN: [id number]  HPI She comes in for followup of her HIV. She continues to take Stribild and feels well. She does see psychiatry and see her primary physician.  No labs prior to visit.  States she is taking her medication. Being evaluated for cerebral and cerebellar atrophy of unknown etiology by Dr. Posey Pronto of neurology.  Sees GYN as well.  Asks about sexual activity.     Review of Systems  Constitutional: Negative for fever, chills and fatigue.  HENT: Negative for sore throat and trouble swallowing.        Dry mouth  Eyes: Negative for visual disturbance.  Respiratory: Negative for shortness of breath.   Cardiovascular: Negative for leg swelling.  Gastrointestinal: Negative for diarrhea and constipation.  Skin: Negative for rash.  Neurological: Negative for dizziness and headaches.  Psychiatric/Behavioral: Negative for dysphoric mood. The patient is not hyperactive.        Objective:   Physical Exam  Constitutional: She appears well-developed and well-nourished.  HENT:  Mouth/Throat: No oropharyngeal exudate.  Eyes: Right eye exhibits no discharge. Left eye exhibits no discharge. No scleral icterus.  Cardiovascular: Normal rate, regular rhythm and normal heart sounds.   No murmur heard. Pulmonary/Chest: Effort normal and breath sounds normal. No respiratory distress. She has no wheezes.  Lymphadenopathy:    She has no cervical adenopathy.  Skin: No rash noted.  Psychiatric:  redirectable          Assessment & Plan:

## 2014-08-18 LAB — T-HELPER CELL (CD4) - (RCID CLINIC ONLY)
CD4 T CELL ABS: 1240 /uL (ref 400–2700)
CD4 T CELL HELPER: 52 % (ref 33–55)

## 2014-08-18 LAB — URINE CYTOLOGY ANCILLARY ONLY
Chlamydia: NEGATIVE
NEISSERIA GONORRHEA: NEGATIVE

## 2014-08-18 LAB — RPR

## 2014-08-19 LAB — HIV-1 RNA QUANT-NO REFLEX-BLD: HIV 1 RNA Quant: <20 copies/mL (ref <20)

## 2014-09-25 ENCOUNTER — Encounter: Payer: Self-pay | Admitting: Endocrinology

## 2014-09-25 ENCOUNTER — Ambulatory Visit (INDEPENDENT_AMBULATORY_CARE_PROVIDER_SITE_OTHER): Payer: Medicare Other | Admitting: Endocrinology

## 2014-09-25 VITALS — BP 112/64 | HR 86 | Temp 97.9°F | Ht 62.0 in | Wt 179.0 lb

## 2014-09-25 DIAGNOSIS — E221 Hyperprolactinemia: Secondary | ICD-10-CM

## 2014-09-25 NOTE — Patient Instructions (Addendum)
We are trying to get the blood test result.  We'll let you know.  If we can't, you should have the prolactin blood test checked.  Please let us know when and where you can have it drawn.
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
## 2014-09-27 ENCOUNTER — Telehealth: Payer: Self-pay | Admitting: Endocrinology

## 2014-09-27 MED ORDER — BROMOCRIPTINE MESYLATE 2.5 MG PO TABS: 1.2500 mg | ORAL_TABLET | Freq: Every day | ORAL | Status: DC

## 2014-09-27 NOTE — Telephone Encounter (Signed)
Correction. All you need to take is 1/2 pill at bedtime. i have sent a prescription to your pharmacy Please come back for a follow-up appointment in 1 month.

## 2014-09-27 NOTE — Telephone Encounter (Signed)
please call patient: We received results from Dr Mancel Bale.  As you said, the prolactin is high. i have sent a prescription to your pharmacy, to lower this.  It has possible side effects of nausea and dizziness.  These go away with time.  You can avoid these by taking it at bedtime, and by taking just take 1/2 pill for the first week.   Please come back for a follow-up appointment in 1 month.

## 2014-09-28 NOTE — Telephone Encounter (Signed)
Left voicemail advising of notes below. Requested call back if the pt would like to discuss.

## 2014-12-08 ENCOUNTER — Other Ambulatory Visit: Payer: Self-pay | Admitting: Internal Medicine

## 2014-12-08 DIAGNOSIS — B20 Human immunodeficiency virus [HIV] disease: Secondary | ICD-10-CM

## 2015-01-19 ENCOUNTER — Emergency Department (HOSPITAL_COMMUNITY)
Admission: EM | Admit: 2015-01-19 | Discharge: 2015-01-19 | Disposition: A | Discharge status: Home / Self Care | Payer: Medicare Other | Attending: Emergency Medicine | Admitting: Emergency Medicine

## 2015-01-19 DIAGNOSIS — Y92524 Gas station as the place of occurrence of the external cause: Secondary | ICD-10-CM | POA: Insufficient documentation

## 2015-01-19 DIAGNOSIS — Z8619 Personal history of other infectious and parasitic diseases: Secondary | ICD-10-CM | POA: Diagnosis not present

## 2015-01-19 DIAGNOSIS — Z3202 Encounter for pregnancy test, result negative: Secondary | ICD-10-CM | POA: Diagnosis not present

## 2015-01-19 DIAGNOSIS — S39012A Strain of muscle, fascia and tendon of lower back, initial encounter: Secondary | ICD-10-CM | POA: Insufficient documentation

## 2015-01-19 DIAGNOSIS — Y9389 Activity, other specified: Secondary | ICD-10-CM | POA: Insufficient documentation

## 2015-01-19 DIAGNOSIS — S3992XA Unspecified injury of lower back, initial encounter: Secondary | ICD-10-CM | POA: Diagnosis present

## 2015-01-19 DIAGNOSIS — W1839XA Other fall on same level, initial encounter: Secondary | ICD-10-CM | POA: Insufficient documentation

## 2015-01-19 DIAGNOSIS — Z21 Asymptomatic human immunodeficiency virus [HIV] infection status: Secondary | ICD-10-CM | POA: Diagnosis not present

## 2015-01-19 DIAGNOSIS — Z79899 Other long term (current) drug therapy: Secondary | ICD-10-CM | POA: Insufficient documentation

## 2015-01-19 DIAGNOSIS — E669 Obesity, unspecified: Secondary | ICD-10-CM | POA: Insufficient documentation

## 2015-01-19 DIAGNOSIS — Y998 Other external cause status: Secondary | ICD-10-CM | POA: Insufficient documentation

## 2015-01-19 DIAGNOSIS — F419 Anxiety disorder, unspecified: Secondary | ICD-10-CM | POA: Diagnosis not present

## 2015-01-19 DIAGNOSIS — S7012XA Contusion of left thigh, initial encounter: Secondary | ICD-10-CM | POA: Insufficient documentation

## 2015-01-19 LAB — URINALYSIS, ROUTINE W REFLEX MICROSCOPIC
BILIRUBIN URINE: NEGATIVE
Glucose, UA: NEGATIVE mg/dL
Hgb urine dipstick: NEGATIVE
KETONES UR: NEGATIVE mg/dL
NITRITE: NEGATIVE
PROTEIN: NEGATIVE mg/dL
Specific Gravity, Urine: 1.008 (ref 1.005–1.030)
pH: 6.5 (ref 5.0–8.0)

## 2015-01-19 LAB — URINE MICROSCOPIC-ADD ON: RBC / HPF: NONE SEEN RBC/hpf (ref 0–5)

## 2015-01-19 LAB — PREGNANCY, URINE: Preg Test, Ur: NEGATIVE

## 2015-01-19 MED ORDER — NAPROXEN 500 MG PO TABS
500.0000 mg | ORAL_TABLET | Freq: Once | ORAL | Status: AC
  Administered 2015-01-19: 500 mg via ORAL
  Filled 2015-01-19: qty 1

## 2015-01-19 MED ORDER — OXYCODONE-ACETAMINOPHEN 5-325 MG PO TABS
2.0000 | ORAL_TABLET | Freq: Once | ORAL | Status: AC
  Administered 2015-01-19: 2 via ORAL
  Filled 2015-01-19: qty 2

## 2015-01-19 MED ORDER — IBUPROFEN 400 MG PO TABS: 400.0000 mg | ORAL_TABLET | Freq: Four times a day (QID) | ORAL | Status: DC | PRN

## 2015-01-19 MED ORDER — HYDROCODONE-ACETAMINOPHEN 5-325 MG PO TABS: 1.0000 | ORAL_TABLET | Freq: Four times a day (QID) | ORAL | Status: DC | PRN

## 2015-01-19 NOTE — ED Notes (Signed)
Pt states a palate of coca-cola products fell on her causing her to fall. Pt c/o back pain and left hip pain. No LOC. Pt is alert and oriented x4 and in no acute distress.

## 2015-01-19 NOTE — Discharge Instructions (Signed)
Contusion °A contusion is a deep bruise. Contusions are the result of a blunt injury to tissues and muscle fibers under the skin. The injury causes bleeding under the skin. The skin overlying the contusion may turn blue, purple, or yellow. Minor injuries will give you a painless contusion, but more severe contusions may stay painful and swollen for a few weeks.  °CAUSES  °This condition is usually caused by a blow, trauma, or direct force to an area of the body. °SYMPTOMS  °Symptoms of this condition include: °· Swelling of the injured area. °· Pain and tenderness in the injured area. °· Discoloration. The area may have redness and then turn blue, purple, or yellow. °DIAGNOSIS  °This condition is diagnosed based on a physical exam and medical history. An X-ray, CT scan, or MRI may be needed to determine if there are any associated injuries, such as broken bones (fractures). °TREATMENT  °Specific treatment for this condition depends on what area of the body was injured. In general, the best treatment for a contusion is resting, icing, applying pressure to (compression), and elevating the injured area. This is often called the RICE strategy. Over-the-counter anti-inflammatory medicines may also be recommended for pain control.  °HOME CARE INSTRUCTIONS  °· Rest the injured area. °· If directed, apply ice to the injured area: °· Put ice in a plastic bag. °· Place a towel between your skin and the bag. °· Leave the ice on for 20 minutes, 2-3 times per day. °· If directed, apply light compression to the injured area using an elastic bandage. Make sure the bandage is not wrapped too tightly. Remove and reapply the bandage as directed by your health care provider. °· If possible, raise (elevate) the injured area above the level of your heart while you are sitting or lying down. °· Take over-the-counter and prescription medicines only as told by your health care provider. °SEEK MEDICAL CARE IF: °· Your symptoms do not  improve after several days of treatment. °· Your symptoms get worse. °· You have difficulty moving the injured area. °SEEK IMMEDIATE MEDICAL CARE IF:  °· You have severe pain. °· You have numbness in a hand or foot. °· Your hand or foot turns pale or cold. °  °This information is not intended to replace advice given to you by your health care provider. Make sure you discuss any questions you have with your health care provider. °  °Document Released: 11/06/2004 Document Revised: 10/18/2014 Document Reviewed: 06/14/2014 °Elsevier Interactive Patient Education ©2016 [employer] ° °Cryotherapy °Cryotherapy is when you put ice on your injury. Ice helps lessen pain and puffiness (swelling) after an injury. Ice works the best when you start using it in the first 24 to 48 hours after an injury. °HOME CARE °· Put a dry or damp towel between the ice pack and your skin. °· You may press gently on the ice pack. °· Leave the ice on for no more than 10 to 20 minutes at a time. °· Check your skin after 5 minutes to make sure your skin is okay. °· Rest at least 20 minutes between ice pack uses. °· Stop using ice when your skin loses feeling (numbness). °· Do not use ice on someone who cannot tell you when it hurts. This includes small children and people with memory problems (dementia). °GET HELP RIGHT AWAY IF: °· You have white spots on your skin. °· Your skin turns blue or pale. °· Your skin feels waxy or hard. °· Your   puffiness gets worse. °MAKE SURE YOU:  °· Understand these instructions. °· Will watch your condition. °· Will get help right away if you are not doing well or get worse. °  °This information is not intended to replace advice given to you by your health care provider. Make sure you discuss any questions you have with your health care provider. °  °Document Released: 07/16/2007 Document Revised: 04/21/2011 Document Reviewed: 09/19/2010 °Elsevier Interactive Patient Education ©2016 [employer] ° °
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
## 2015-02-27 DIAGNOSIS — G3184 Mild cognitive impairment, so stated: Secondary | ICD-10-CM | POA: Diagnosis not present

## 2015-02-27 DIAGNOSIS — F3175 Bipolar disorder, in partial remission, most recent episode depressed: Secondary | ICD-10-CM | POA: Diagnosis not present

## 2015-02-27 DIAGNOSIS — F900 Attention-deficit hyperactivity disorder, predominantly inattentive type: Secondary | ICD-10-CM | POA: Diagnosis not present

## 2015-02-28 DIAGNOSIS — Z23 Encounter for immunization: Secondary | ICD-10-CM | POA: Diagnosis not present

## 2015-03-06 DIAGNOSIS — F3175 Bipolar disorder, in partial remission, most recent episode depressed: Secondary | ICD-10-CM | POA: Diagnosis not present

## 2015-03-06 DIAGNOSIS — G3184 Mild cognitive impairment, so stated: Secondary | ICD-10-CM | POA: Diagnosis not present

## 2015-03-06 DIAGNOSIS — F900 Attention-deficit hyperactivity disorder, predominantly inattentive type: Secondary | ICD-10-CM | POA: Diagnosis not present

## 2015-03-13 DIAGNOSIS — F3175 Bipolar disorder, in partial remission, most recent episode depressed: Secondary | ICD-10-CM | POA: Diagnosis not present

## 2015-03-13 DIAGNOSIS — G3184 Mild cognitive impairment, so stated: Secondary | ICD-10-CM | POA: Diagnosis not present

## 2015-03-13 DIAGNOSIS — F900 Attention-deficit hyperactivity disorder, predominantly inattentive type: Secondary | ICD-10-CM | POA: Diagnosis not present

## 2015-03-21 DIAGNOSIS — F900 Attention-deficit hyperactivity disorder, predominantly inattentive type: Secondary | ICD-10-CM | POA: Diagnosis not present

## 2015-03-21 DIAGNOSIS — G3184 Mild cognitive impairment, so stated: Secondary | ICD-10-CM | POA: Diagnosis not present

## 2015-03-21 DIAGNOSIS — F3175 Bipolar disorder, in partial remission, most recent episode depressed: Secondary | ICD-10-CM | POA: Diagnosis not present

## 2015-03-25 ENCOUNTER — Other Ambulatory Visit: Payer: Self-pay | Admitting: Endocrinology

## 2015-04-03 DIAGNOSIS — F900 Attention-deficit hyperactivity disorder, predominantly inattentive type: Secondary | ICD-10-CM | POA: Diagnosis not present

## 2015-04-03 DIAGNOSIS — G3184 Mild cognitive impairment, so stated: Secondary | ICD-10-CM | POA: Diagnosis not present

## 2015-04-03 DIAGNOSIS — F3175 Bipolar disorder, in partial remission, most recent episode depressed: Secondary | ICD-10-CM | POA: Diagnosis not present

## 2015-04-05 DIAGNOSIS — F3175 Bipolar disorder, in partial remission, most recent episode depressed: Secondary | ICD-10-CM | POA: Diagnosis not present

## 2015-04-05 DIAGNOSIS — F900 Attention-deficit hyperactivity disorder, predominantly inattentive type: Secondary | ICD-10-CM | POA: Diagnosis not present

## 2015-04-10 DIAGNOSIS — F900 Attention-deficit hyperactivity disorder, predominantly inattentive type: Secondary | ICD-10-CM | POA: Diagnosis not present

## 2015-04-10 DIAGNOSIS — F3175 Bipolar disorder, in partial remission, most recent episode depressed: Secondary | ICD-10-CM | POA: Diagnosis not present

## 2015-04-20 DIAGNOSIS — F900 Attention-deficit hyperactivity disorder, predominantly inattentive type: Secondary | ICD-10-CM | POA: Diagnosis not present

## 2015-04-20 DIAGNOSIS — F3175 Bipolar disorder, in partial remission, most recent episode depressed: Secondary | ICD-10-CM | POA: Diagnosis not present

## 2015-04-27 DIAGNOSIS — R52 Pain, unspecified: Secondary | ICD-10-CM | POA: Diagnosis not present

## 2015-04-27 DIAGNOSIS — F900 Attention-deficit hyperactivity disorder, predominantly inattentive type: Secondary | ICD-10-CM | POA: Diagnosis not present

## 2015-04-27 DIAGNOSIS — F3175 Bipolar disorder, in partial remission, most recent episode depressed: Secondary | ICD-10-CM | POA: Diagnosis not present

## 2015-04-27 DIAGNOSIS — J209 Acute bronchitis, unspecified: Secondary | ICD-10-CM | POA: Diagnosis not present

## 2015-05-04 ENCOUNTER — Emergency Department (HOSPITAL_COMMUNITY)
Admission: EM | Admit: 2015-05-04 | Discharge: 2015-05-05 | Disposition: A | Discharge status: Home / Self Care | Payer: Medicare Other | Attending: Emergency Medicine | Admitting: Emergency Medicine

## 2015-05-04 ENCOUNTER — Emergency Department (HOSPITAL_COMMUNITY): Payer: Medicare Other

## 2015-05-04 ENCOUNTER — Encounter (HOSPITAL_COMMUNITY): Payer: Self-pay

## 2015-05-04 DIAGNOSIS — J40 Bronchitis, not specified as acute or chronic: Secondary | ICD-10-CM | POA: Insufficient documentation

## 2015-05-04 DIAGNOSIS — M791 Myalgia: Secondary | ICD-10-CM | POA: Insufficient documentation

## 2015-05-04 DIAGNOSIS — Z8619 Personal history of other infectious and parasitic diseases: Secondary | ICD-10-CM | POA: Insufficient documentation

## 2015-05-04 DIAGNOSIS — Z915 Personal history of self-harm: Secondary | ICD-10-CM | POA: Insufficient documentation

## 2015-05-04 DIAGNOSIS — B20 Human immunodeficiency virus [HIV] disease: Secondary | ICD-10-CM | POA: Insufficient documentation

## 2015-05-04 DIAGNOSIS — Z8639 Personal history of other endocrine, nutritional and metabolic disease: Secondary | ICD-10-CM | POA: Insufficient documentation

## 2015-05-04 DIAGNOSIS — R0602 Shortness of breath: Secondary | ICD-10-CM | POA: Diagnosis not present

## 2015-05-04 DIAGNOSIS — Z79899 Other long term (current) drug therapy: Secondary | ICD-10-CM | POA: Insufficient documentation

## 2015-05-04 DIAGNOSIS — F419 Anxiety disorder, unspecified: Secondary | ICD-10-CM | POA: Diagnosis not present

## 2015-05-04 DIAGNOSIS — R05 Cough: Secondary | ICD-10-CM | POA: Diagnosis not present

## 2015-05-04 MED ORDER — HYDROCOD POLST-CPM POLST ER 10-8 MG/5ML PO SUER
5.0000 mL | Freq: Once | ORAL | Status: AC
  Administered 2015-05-04: 5 mL via ORAL
  Filled 2015-05-04: qty 5
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
## 2015-05-04 NOTE — ED Notes (Signed)
PT C/O A PRODUCTIVE COUGH AND BODY ACHES X1 WEEK. PT WAS SEEN BY PMD AND GIVEN PRESCRIPTIONS, BUT SHE STATES SHE IS NOT FEELING BETTER. DENIES FEVER.

## 2015-05-05 DIAGNOSIS — J40 Bronchitis, not specified as acute or chronic: Secondary | ICD-10-CM | POA: Diagnosis not present

## 2015-05-05 MED ORDER — HYDROCOD POLST-CPM POLST ER 10-8 MG/5ML PO SUER: 5.0000 mL | Freq: Two times a day (BID) | ORAL | Status: DC

## 2015-05-05 NOTE — Discharge Instructions (Signed)

## 2015-05-11 DIAGNOSIS — F3175 Bipolar disorder, in partial remission, most recent episode depressed: Secondary | ICD-10-CM | POA: Diagnosis not present

## 2015-05-11 DIAGNOSIS — F900 Attention-deficit hyperactivity disorder, predominantly inattentive type: Secondary | ICD-10-CM | POA: Diagnosis not present

## 2015-05-14 ENCOUNTER — Other Ambulatory Visit: Payer: Self-pay | Admitting: Internal Medicine

## 2015-05-18 DIAGNOSIS — F900 Attention-deficit hyperactivity disorder, predominantly inattentive type: Secondary | ICD-10-CM | POA: Diagnosis not present

## 2015-05-18 DIAGNOSIS — F3175 Bipolar disorder, in partial remission, most recent episode depressed: Secondary | ICD-10-CM | POA: Diagnosis not present

## 2015-06-26 DIAGNOSIS — G3184 Mild cognitive impairment, so stated: Secondary | ICD-10-CM | POA: Diagnosis not present

## 2015-06-26 DIAGNOSIS — F900 Attention-deficit hyperactivity disorder, predominantly inattentive type: Secondary | ICD-10-CM | POA: Diagnosis not present

## 2015-06-26 DIAGNOSIS — F3175 Bipolar disorder, in partial remission, most recent episode depressed: Secondary | ICD-10-CM | POA: Diagnosis not present

## 2015-07-03 DIAGNOSIS — F3175 Bipolar disorder, in partial remission, most recent episode depressed: Secondary | ICD-10-CM | POA: Diagnosis not present

## 2015-07-03 DIAGNOSIS — G3184 Mild cognitive impairment, so stated: Secondary | ICD-10-CM | POA: Diagnosis not present

## 2015-07-03 DIAGNOSIS — F900 Attention-deficit hyperactivity disorder, predominantly inattentive type: Secondary | ICD-10-CM | POA: Diagnosis not present

## 2015-07-04 ENCOUNTER — Ambulatory Visit (INDEPENDENT_AMBULATORY_CARE_PROVIDER_SITE_OTHER): Payer: Medicare Other | Admitting: Internal Medicine

## 2015-07-04 VITALS — Wt 176.0 lb

## 2015-07-04 DIAGNOSIS — N189 Chronic kidney disease, unspecified: Secondary | ICD-10-CM

## 2015-07-04 DIAGNOSIS — Z79899 Other long term (current) drug therapy: Secondary | ICD-10-CM

## 2015-07-04 DIAGNOSIS — R87612 Low grade squamous intraepithelial lesion on cytologic smear of cervix (LGSIL): Secondary | ICD-10-CM

## 2015-07-04 DIAGNOSIS — N182 Chronic kidney disease, stage 2 (mild): Secondary | ICD-10-CM

## 2015-07-04 DIAGNOSIS — B20 Human immunodeficiency virus [HIV] disease: Secondary | ICD-10-CM

## 2015-07-04 DIAGNOSIS — B009 Herpesviral infection, unspecified: Secondary | ICD-10-CM | POA: Diagnosis not present

## 2015-07-04 DIAGNOSIS — Z113 Encounter for screening for infections with a predominantly sexual mode of transmission: Secondary | ICD-10-CM

## 2015-07-04 DIAGNOSIS — N2889 Other specified disorders of kidney and ureter: Secondary | ICD-10-CM

## 2015-07-04 LAB — CBC WITH DIFFERENTIAL/PLATELET
BASOS ABS: 53 {cells}/uL (ref 0–200)
Basophils Relative: 1 %
EOS ABS: 371 {cells}/uL (ref 15–500)
Eosinophils Relative: 7 %
HCT: 31.6 % — ABNORMAL LOW (ref 35.0–45.0)
Hemoglobin: 10.3 g/dL — ABNORMAL LOW (ref 11.7–15.5)
LYMPHS PCT: 33 %
Lymphs Abs: 1749 cells/uL (ref 850–3900)
MCH: 29.5 pg (ref 27.0–33.0)
MCHC: 32.6 g/dL (ref 32.0–36.0)
MCV: 90.5 fL (ref 80.0–100.0)
MONOS PCT: 7 %
MPV: 9.8 fL (ref 7.5–12.5)
Monocytes Absolute: 371 cells/uL (ref 200–950)
Neutro Abs: 2756 cells/uL (ref 1500–7800)
Neutrophils Relative %: 52 %
PLATELETS: 343 10*3/uL (ref 140–400)
RBC: 3.49 MIL/uL — ABNORMAL LOW (ref 3.80–5.10)
RDW: 15.7 % — AB (ref 11.0–15.0)
WBC: 5.3 10*3/uL (ref 3.8–10.8)

## 2015-07-04 MED ORDER — ELVITEG-COBIC-EMTRICIT-TENOFAF 150-150-200-10 MG PO TABS: 1.0000 | ORAL_TABLET | Freq: Every day | ORAL | Status: DC

## 2015-07-04 MED ORDER — VALACYCLOVIR HCL 500 MG PO TABS: 500.0000 mg | ORAL_TABLET | Freq: Every day | ORAL | Status: DC

## 2015-07-04 NOTE — Assessment & Plan Note (Signed)
Recurrent outbreaks so will try daily suppression

## 2015-07-04 NOTE — Assessment & Plan Note (Signed)
She will call her GYN to get back in to update her PAP and discuss the IUD.

## 2015-07-04 NOTE — Assessment & Plan Note (Signed)
Will recheck and changing to TAF regimen

## 2015-07-04 NOTE — Assessment & Plan Note (Signed)
Doing well, though I will change her to [facility] with her last creat mildly elevated.

## 2015-07-04 NOTE — Progress Notes (Signed)
  Subjective:    Patient ID: Yolanda Jones, female    DOB: October 31, 1980, 35 y.o.   MRN: [id number]  HPI She comes in for followup of her HIV.  She continues to take Stribild and feels well. Has not been here in 1 year.  She does see psychiatry and see her primary physician.  No labs prior to visit.  States she is taking her medication daily without missed doses.  Has not had a recent PAP smear and gets them done by gynecology.  Also has an IUD.  Last labs 1 year ago and creat mildly elevated.      Review of Systems  Constitutional: Negative for fatigue.  HENT: Negative for sore throat and trouble swallowing.        Dry mouth  Gastrointestinal: Negative for diarrhea.  Skin: Negative for rash.  Neurological: Negative for dizziness and headaches.  Psychiatric/Behavioral: Negative for dysphoric mood. The patient is not hyperactive.        Some mania       Objective:   Physical Exam  Constitutional: She appears well-developed and well-nourished.  HENT:  Mouth/Throat: No oropharyngeal exudate.  Eyes: Right eye exhibits no discharge. Left eye exhibits no discharge. No scleral icterus.  Cardiovascular: Normal rate, regular rhythm and normal heart sounds.   No murmur heard. Pulmonary/Chest: Effort normal and breath sounds normal. No respiratory distress.  Lymphadenopathy:    She has no cervical adenopathy.  Skin: No rash noted.  Psychiatric:  Hypomanic but redirectable    Social History   Social History  . Marital Status: Single    Spouse Name: N/A  . Number of Children: N/A  . Years of Education: N/A   Occupational History  . disabled    Social History Main Topics  . Smoking status: Never Smoker   . Smokeless tobacco: Never Used  . Alcohol Use: 0.0 oz/week    0 Standard drinks or equivalent per week     Comment: rarely  . Drug Use: No     Comment:  previous use :IV use of crack/cocaine and oxycontin   . Sexual Activity:    Partners: Male     Comment: pt. given  condoms   Other Topics Concern  . Not on file   Social History Narrative   She lives with mother and nephew.   She is on disability since 2005 for mental health.   Highest level of education:  12th grade              Assessment & Plan:

## 2015-07-05 LAB — COMPLETE METABOLIC PANEL WITH GFR
ALT: 11 U/L (ref 6–29)
AST: 19 U/L (ref 10–30)
Albumin: 3.9 g/dL (ref 3.6–5.1)
Alkaline Phosphatase: 69 U/L (ref 33–115)
BILIRUBIN TOTAL: 0.3 mg/dL (ref 0.2–1.2)
BUN: 9 mg/dL (ref 7–25)
CO2: 22 mmol/L (ref 20–31)
CREATININE: 1.54 mg/dL — AB (ref 0.50–1.10)
Calcium: 8.9 mg/dL (ref 8.6–10.2)
Chloride: 101 mmol/L (ref 98–110)
GFR, Est African American: 49 mL/min — ABNORMAL LOW (ref >=60)
GFR, Est Non African American: 43 mL/min — ABNORMAL LOW (ref >=60)
GLUCOSE: 86 mg/dL (ref 65–99)
Potassium: 4.6 mmol/L (ref 3.5–5.3)
SODIUM: 137 mmol/L (ref 135–146)
TOTAL PROTEIN: 6.8 g/dL (ref 6.1–8.1)

## 2015-07-05 LAB — HIV-1 RNA QUANT-NO REFLEX-BLD: HIV 1 RNA Quant: <20 copies/mL (ref <20)

## 2015-07-05 LAB — LIPID PANEL
CHOL/HDL RATIO: 4 ratio (ref <=5.0)
Cholesterol: 216 mg/dL — ABNORMAL HIGH (ref 125–200)
HDL: 54 mg/dL (ref >=46)
LDL Cholesterol: 142 mg/dL — ABNORMAL HIGH (ref <130)
Triglycerides: 101 mg/dL (ref <150)
VLDL: 20 mg/dL (ref <30)

## 2015-07-06 LAB — T-HELPER CELL (CD4) - (RCID CLINIC ONLY)
CD4 % Helper T Cell: 50 % (ref 33–55)
CD4 T Cell Abs: 920 /uL (ref 400–2700)

## 2015-07-06 LAB — RPR

## 2015-07-17 DIAGNOSIS — G3184 Mild cognitive impairment, so stated: Secondary | ICD-10-CM | POA: Diagnosis not present

## 2015-07-17 DIAGNOSIS — F900 Attention-deficit hyperactivity disorder, predominantly inattentive type: Secondary | ICD-10-CM | POA: Diagnosis not present

## 2015-07-17 DIAGNOSIS — F3175 Bipolar disorder, in partial remission, most recent episode depressed: Secondary | ICD-10-CM | POA: Diagnosis not present

## 2015-07-24 DIAGNOSIS — F3175 Bipolar disorder, in partial remission, most recent episode depressed: Secondary | ICD-10-CM | POA: Diagnosis not present

## 2015-07-26 DIAGNOSIS — E221 Hyperprolactinemia: Secondary | ICD-10-CM | POA: Diagnosis not present

## 2015-07-26 DIAGNOSIS — Z124 Encounter for screening for malignant neoplasm of cervix: Secondary | ICD-10-CM | POA: Diagnosis not present

## 2015-07-26 DIAGNOSIS — Z01411 Encounter for gynecological examination (general) (routine) with abnormal findings: Secondary | ICD-10-CM | POA: Diagnosis not present

## 2015-07-26 DIAGNOSIS — Z6835 Body mass index (BMI) 35.0-35.9, adult: Secondary | ICD-10-CM | POA: Diagnosis not present

## 2015-07-31 DIAGNOSIS — F3175 Bipolar disorder, in partial remission, most recent episode depressed: Secondary | ICD-10-CM | POA: Diagnosis not present

## 2015-08-07 DIAGNOSIS — F3175 Bipolar disorder, in partial remission, most recent episode depressed: Secondary | ICD-10-CM | POA: Diagnosis not present

## 2015-08-15 DIAGNOSIS — F3175 Bipolar disorder, in partial remission, most recent episode depressed: Secondary | ICD-10-CM | POA: Diagnosis not present

## 2015-08-24 DIAGNOSIS — F3175 Bipolar disorder, in partial remission, most recent episode depressed: Secondary | ICD-10-CM | POA: Diagnosis not present

## 2015-08-28 DIAGNOSIS — F3175 Bipolar disorder, in partial remission, most recent episode depressed: Secondary | ICD-10-CM | POA: Diagnosis not present

## 2015-09-04 DIAGNOSIS — F3175 Bipolar disorder, in partial remission, most recent episode depressed: Secondary | ICD-10-CM | POA: Diagnosis not present

## 2015-09-14 DIAGNOSIS — F3175 Bipolar disorder, in partial remission, most recent episode depressed: Secondary | ICD-10-CM | POA: Diagnosis not present

## 2015-09-18 DIAGNOSIS — F3175 Bipolar disorder, in partial remission, most recent episode depressed: Secondary | ICD-10-CM | POA: Diagnosis not present

## 2015-09-19 DIAGNOSIS — F3175 Bipolar disorder, in partial remission, most recent episode depressed: Secondary | ICD-10-CM | POA: Diagnosis not present

## 2015-09-19 DIAGNOSIS — F900 Attention-deficit hyperactivity disorder, predominantly inattentive type: Secondary | ICD-10-CM | POA: Diagnosis not present

## 2015-09-25 DIAGNOSIS — F3175 Bipolar disorder, in partial remission, most recent episode depressed: Secondary | ICD-10-CM | POA: Diagnosis not present

## 2015-09-25 DIAGNOSIS — F900 Attention-deficit hyperactivity disorder, predominantly inattentive type: Secondary | ICD-10-CM | POA: Diagnosis not present

## 2015-09-28 DIAGNOSIS — M79601 Pain in right arm: Secondary | ICD-10-CM | POA: Diagnosis not present

## 2015-10-08 ENCOUNTER — Ambulatory Visit (INDEPENDENT_AMBULATORY_CARE_PROVIDER_SITE_OTHER): Payer: Medicare Other | Admitting: Internal Medicine

## 2015-10-08 ENCOUNTER — Ambulatory Visit: Payer: Medicare Other | Admitting: *Deleted

## 2015-10-08 ENCOUNTER — Encounter: Payer: Self-pay | Admitting: Internal Medicine

## 2015-10-08 VITALS — BP 98/63 | HR 125 | Temp 97.9°F | Wt 185.0 lb

## 2015-10-08 DIAGNOSIS — Z23 Encounter for immunization: Secondary | ICD-10-CM | POA: Diagnosis not present

## 2015-10-08 DIAGNOSIS — B20 Human immunodeficiency virus [HIV] disease: Secondary | ICD-10-CM | POA: Diagnosis not present

## 2015-10-08 DIAGNOSIS — N182 Chronic kidney disease, stage 2 (mild): Secondary | ICD-10-CM

## 2015-10-08 DIAGNOSIS — N189 Chronic kidney disease, unspecified: Secondary | ICD-10-CM | POA: Diagnosis not present

## 2015-10-08 DIAGNOSIS — G540 Brachial plexus disorders: Secondary | ICD-10-CM | POA: Insufficient documentation

## 2015-10-08 DIAGNOSIS — F411 Generalized anxiety disorder: Secondary | ICD-10-CM

## 2015-10-08 LAB — BASIC METABOLIC PANEL
BUN: 16 mg/dL (ref 7–25)
CALCIUM: 9.2 mg/dL (ref 8.6–10.2)
CO2: 23 mmol/L (ref 20–31)
Chloride: 106 mmol/L (ref 98–110)
Creat: 1.5 mg/dL — ABNORMAL HIGH (ref 0.50–1.10)
GLUCOSE: 101 mg/dL — AB (ref 65–99)
Potassium: 3.1 mmol/L — ABNORMAL LOW (ref 3.5–5.3)
Sodium: 137 mmol/L (ref 135–146)

## 2015-10-08 NOTE — Assessment & Plan Note (Signed)
I suspect something related to the brachial plexus but unsure.  I will refer her to neurology.

## 2015-10-08 NOTE — Progress Notes (Signed)
  Subjective:    Patient ID: Yolanda Jones, female    DOB: 05-29-1980, 35 y.o.   MRN: [id number]  HPI She comes in for followup of her HIV.  She is now on Genvoya and feels well after changing last visit.  She does see psychiatry and see her primary physician.  No labs prior to visit.  States she is taking her medication daily without missed doses.  No weight loss, no diarrhea.   She comes in today with several complaints.  One is her right arm is experiencing numbness and tingling with a "pop" feeling occasionally.  The numbness, tingling sounds to be constant and is new over the last several weeks.  She went to an ED and got steroids.  Also with knee pain ( not new) and more tiredness.  She has had multiple medication changes and increased doses and slo relates some of the tiredness with taking xanax.      Review of Systems  Constitutional: Negative for fatigue.  HENT: Negative for sore throat and trouble swallowing.        Dry mouth  Gastrointestinal: Negative for diarrhea.  Skin: Negative for rash.  Neurological: Negative for dizziness and headaches.  Psychiatric/Behavioral: Negative for dysphoric mood. The patient is not hyperactive.        Some mania       Objective:   Physical Exam  Constitutional: She appears well-developed and well-nourished.  HENT:  Mouth/Throat: No oropharyngeal exudate.  Eyes: Right eye exhibits no discharge. Left eye exhibits no discharge. No scleral icterus.  Cardiovascular: Normal rate, regular rhythm and normal heart sounds.   No murmur heard. Pulmonary/Chest: Effort normal and breath sounds normal. No respiratory distress.  Lymphadenopathy:    She has no cervical adenopathy.  Skin: No rash noted.  Psychiatric:  Hypomanic but redirectable    Social History   Social History  . Marital status: Single    Spouse name: N/A  . Number of children: N/A  . Years of education: N/A   Occupational History  . disabled    Social History Main  Topics  . Smoking status: Never Smoker  . Smokeless tobacco: Never Used  . Alcohol use 0.0 oz/week     Comment: rarely  . Drug use: No     Comment:  previous use :IV use of crack/cocaine and oxycontin   . Sexual activity: Not Currently    Partners: Male     Comment: pt. given condoms   Other Topics Concern  . Not on file   Social History Narrative   She lives with mother and nephew.   She is on disability since 2005 for mental health.   Highest level of education:  12th grade              Assessment & Plan:

## 2015-10-08 NOTE — BH Specialist Note (Signed)
Counselor met with Yolanda Jones in the exam room today for a warm hand off from nursing staff.  Yolanda Jones was oriented times four with a pleasant disposition.  Yolanda Jones was dressed well and was friendly.  Yolanda Jones communicated about a life that has been filled with drug use and mental health problems.  Yolanda Jones stated that Yolanda Jones sees a therapist but does not feel Yolanda Jones is getting much out of it other than a nice friendship.  Counselor used reflective listening skills and provided support and encouragement.  Counselor recommended that Yolanda Jones use the counseling services available to her at [facility].  Yolanda Jones agreed it may be time to switch from one counselor to another to narrow down and work on specific ways to learn to cope in life. Yolanda Jones was invited to the HIV support group to which Yolanda Jones replied that Yolanda Jones would like to attend.  Counselor provided the details about the group and encouraged her to come.   Rolena Infante, MA, LPC [facility]

## 2015-10-08 NOTE — Assessment & Plan Note (Signed)
Will check bmp today to see if better on TAF-containing regimen

## 2015-10-08 NOTE — Assessment & Plan Note (Signed)
Will get her labs today and rtc 6 months

## 2015-10-09 DIAGNOSIS — F900 Attention-deficit hyperactivity disorder, predominantly inattentive type: Secondary | ICD-10-CM | POA: Diagnosis not present

## 2015-10-09 DIAGNOSIS — F3175 Bipolar disorder, in partial remission, most recent episode depressed: Secondary | ICD-10-CM | POA: Diagnosis not present

## 2015-10-09 LAB — T-HELPER CELL (CD4) - (RCID CLINIC ONLY)
CD4 T CELL ABS: 1500 /uL (ref 400–2700)
CD4 T CELL HELPER: 57 % — AB (ref 33–55)

## 2015-10-10 LAB — HIV-1 RNA QUANT-NO REFLEX-BLD: HIV 1 RNA Quant: <20 copies/mL (ref <20)

## 2015-10-18 ENCOUNTER — Ambulatory Visit: Payer: Medicare Other | Admitting: Neurology

## 2015-10-18 NOTE — Progress Notes (Deleted)
Follow-up Visit   Date: 10/18/15    Yolanda Jones MRN: [id number] DOB: 1980-11-28   Interim History: Yolanda Jones is a 35 y.o. right-handed Caucasian female with pituitary adenoma with hyperprolactinoma, HIV (diagnosed 2010, on HAART), PTSD, GAD, bipolar disorder, and hyperthyroidism returning to the clinic for follow-up of memory impairment.  The patient was accompanied to the clinic by her mother who also provides collateral information.    History of present illness: Initial presentation 09/02/2013 She noticed difficulty with short and long term memory especially over the past several months. She forgets details of conversation and finds herself asking her friends and family members to repeat things and remind her. She is "really concerned for my future". She denies any auditory or visual hallucination. Her mood is good and is seeing Dr. [name] ([place]) and is seeing a therapist once every few weeks. She does not have transportation and is trying to use [employer] [facility].   She is repeating things, forgetting details of conversation, misplacing items, word-finding difficulties, and cannot multitask effectively.    UPDATE 05/24/2014:  She does not why she is here today.  She underwent neuropsychiatric testing in which found cognitive deficits in multiple domains and ultimately was diagnosed with unspecified cognitive disorder.  Her mother does report that her memory and behavior has significant improved since the summer of 2015 and attributes this to medication adjustment and her mother managing her medications.  She was taken off xanax 66m and switched to 0.563mprn for panic attacks. However, there patient still experiences memory problems daily.  For instance, her mother says that she can give her two things [place] will forget items.  She cannot manage her own medications and is not driving.  She can walk into a room and forget why she  was there.  She cannot recall recent events.  She denies any hallucinations, but does endorose having psychotic break as "if you're on acid". She is not having psychotherapy because of travel difficulties.    UPDATE 10/18/2015:  ***  Medications:  Current Outpatient Prescriptions on File Prior to Visit  Medication Sig Dispense Refill  . ALPRAZolam (XANAX) 0.5 MG tablet   3  . amphetamine-dextroamphetamine (ADDERALL) 20 MG tablet Take 20 mg by mouth 3 (three) times daily.    . benztropine (COGENTIN) 2 MG tablet Take 2 mg by mouth daily.    . Marland Kitchenlvitegravir-cobicistat-emtricitabine-tenofovir (GENVOYA) 150-150-200-10 MG TABS tablet Take 1 tablet by mouth daily. 30 tablet 11  . esomeprazole (NEXIUM) 20 MG capsule Take 40 mg by mouth daily before breakfast.     . estazolam (PROSOM) 2 MG tablet   2  . ibuprofen (ADVIL,MOTRIN) 400 MG tablet Take 1 tablet (400 mg total) by mouth every 6 (six) hours as needed. 30 tablet 0  . INVEGA 3 MG 24 hr tablet   0  . Multiple Vitamin (MULTIVITAMIN WITH MINERALS) TABS tablet Take 1 tablet by mouth daily.    . Marland Kitchenmega-3 acid ethyl esters (LOVAZA) 1 G capsule Take 1 g by mouth daily.    . paliperidone (INVEGA) 6 MG 24 hr tablet Take 3 mg by mouth every morning.     . Marland KitchenRESCRIPTION MEDICATION 1 each. Paraguard IUD placed in 2006    . sertraline (ZOLOFT) 100 MG tablet Take 100 mg by mouth daily.    . valACYclovir (VALTREX) 500 MG tablet TAKE 1 TABLET BY MOUTH EVERY 12 HOURS FOR 3 DAYS 6 tablet 2  .  valACYclovir (VALTREX) 500 MG tablet Take 1 tablet (500 mg total) by mouth daily. 30 tablet 11   No current facility-administered medications on file prior to visit.     Allergies:  Allergies  Allergen Reactions  . Meloxicam   . Oxaprozin   . Strattera [Atomoxetine Hcl]   . Tramadol     Review of Systems:  CONSTITUTIONAL: No fevers, chills, night sweats, or weight loss.  EYES: No visual changes or eye pain ENT: No hearing changes.  No history of nose bleeds.     RESPIRATORY: No cough, wheezing and shortness of breath.   CARDIOVASCULAR: Negative for chest pain, and palpitations.   GI: Negative for abdominal discomfort, blood in stools or black stools.  No recent change in bowel habits.   GU:  No history of incontinence.   MUSCLOSKELETAL: No history of joint pain or swelling.  No myalgias.   SKIN: Negative for lesions, rash, and itching.   ENDOCRINE: Negative for cold or heat intolerance, polydipsia or goiter.   PSYCH:  ++ depression or anxiety symptoms.   NEURO: As Above.   Vital Signs:  There were no vitals taken for this visit.   General: Poor attention, answers questions appropriately but can go off on a tangent Neck: no carotid bruit CV: RRR Ext: No edema, old scars on her forearms from cutting behavior  Neurological Exam: MENTAL STATUS including orientation to time, place, person is intact.  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  Current events:  HB2 law in [place], "our governor is screwing [place]".  Attention span and concentration is poor.  She appears very scattered and restless.  Speech is not dysarthric, but she can have tangential thought process.  CRANIAL NERVES: No visual field defects.  Pupils equal round and reactive to light.  Normal conjugate, extra-ocular eye movements in all directions of gaze.  No ptosis. Normal facial sensation.  Face is symmetric. Palate elevates symmetrically.  Tongue is midline.  No pathological facial reflexes.   MOTOR:  Motor strength is 5/5 in all extremities.  No atrophy, fasciculations or abnormal movements.  No pronator drift.  Tone is normal.    MSRs:  Reflexes are 3+/4 throughout, except 2+/4 at the Achilles bilaterally.  SENSORY:  Intact to vibration.  COORDINATION/GAIT:  Normal finger-to- nose-finger and heel-to-shin.  Intact rapid alternating movements bilaterally.  Gait narrow based and stable.   Data: MRI brain wwo contrast 09/21/2013: Premature for age cerebral and cerebellar atrophy. No acute intracranial  findings.  Motion degraded pituitary sequences demonstrate normal gland height without significant areas of differential enhancement. Today's scan resembles most closely the appearance of the brain on 05/25/2007 where no definite abnormality was observed.   MRI lumbar without contrast spine 10/29/2010: 1. Interval development a shallow right foraminal disc protrusion at L3-L4 causing possible right L3 nerve root encroachment.  2. Interval development of left foraminal disc protrusion at L4-L5 causing possible left L4 nerve root encroachment.  3. Interval development of right paracentral disc protrusion at L5- S1 without sacral or exiting L5 nerve root encroachment.  MRI brain with and without contrast 05/25/2007: Normal appearing brain  Stable appearance of the pituitary gland. 3 mm focus in the inferior aspect of the gland previously was questioned as a possible microadenoma. I am not sure of that, but certainly there has not been any change or progression.  XR cervical spine 03/08/2003: No acute abnormality  Labs 05/24/2014:  ESR 22, CRP <0.5, vitamin B12 324, vitamin E 1.5, heavy metal screen negative, TSH 3.246  IMPRESSION: Ms. Mendibles is a 35 year old female with psychiatric disorder, HIV positive, and polysubstance abuse returning for evaluation of cognitive impairment ***. Although her mother notes some improvement of her behavior and cognition since summer 2015, she continues to have moderate difficulties with short-term memory and performing IADLs.  MRI brain shows premature atrophy which can be seen in individual siwht long history of psychotropic medications.  Neuropsycological testing shows unspecifified cognitive disorder. HIV dementia is unlikely especially with the fluctuating course of symptoms and side effects from medications (currently taking adderall, Cogentin, ativan, and Zoloft) as well as mood disorder and psychological stresses of the most likely etiologies for memory  problems.  Less likely possibilities including autoimmune encephalitis (?NMDA encephalitis) was discussed, including performing a spinal tap to be sure we are not missing alterative diagnosis, however she would like to try to see if her psychiatric health can be optimized.  If there is no improvement or worsening of memory, low threshold to obtain CSF testing.  In the meantime, I will screen for other causes of progressive memory changes   PLAN/RECOMMENDATIONS:  1.  *** 2.  Encouraged to follow-up with psychiatry and start seeing counselor 3.  Return to clinic as needed.      The duration of this appointment visit was 35 minutes of face-to-face time with the patient.  Greater than 50% of this time was spent in counseling, explanation of diagnosis, planning of further management, and coordination of care.   Thank you for allowing me to participate in patient's care.  If I can answer any additional questions, I would be pleased to do so.    Sincerely,    Marjon Doxtater K. Posey Pronto, DO

## 2015-10-23 DIAGNOSIS — F3175 Bipolar disorder, in partial remission, most recent episode depressed: Secondary | ICD-10-CM | POA: Diagnosis not present

## 2015-10-23 DIAGNOSIS — F900 Attention-deficit hyperactivity disorder, predominantly inattentive type: Secondary | ICD-10-CM | POA: Diagnosis not present

## 2015-10-30 DIAGNOSIS — F900 Attention-deficit hyperactivity disorder, predominantly inattentive type: Secondary | ICD-10-CM | POA: Diagnosis not present

## 2015-10-30 DIAGNOSIS — F3175 Bipolar disorder, in partial remission, most recent episode depressed: Secondary | ICD-10-CM | POA: Diagnosis not present

## 2015-10-31 ENCOUNTER — Other Ambulatory Visit: Payer: Self-pay | Admitting: Obstetrics and Gynecology

## 2015-10-31 DIAGNOSIS — N72 Inflammatory disease of cervix uteri: Secondary | ICD-10-CM | POA: Diagnosis not present

## 2015-10-31 DIAGNOSIS — E221 Hyperprolactinemia: Secondary | ICD-10-CM | POA: Diagnosis not present

## 2015-10-31 DIAGNOSIS — R87619 Unspecified abnormal cytological findings in specimens from cervix uteri: Secondary | ICD-10-CM | POA: Diagnosis not present

## 2015-10-31 DIAGNOSIS — R87610 Atypical squamous cells of undetermined significance on cytologic smear of cervix (ASC-US): Secondary | ICD-10-CM | POA: Diagnosis not present

## 2015-11-06 DIAGNOSIS — F900 Attention-deficit hyperactivity disorder, predominantly inattentive type: Secondary | ICD-10-CM | POA: Diagnosis not present

## 2015-11-06 DIAGNOSIS — F3175 Bipolar disorder, in partial remission, most recent episode depressed: Secondary | ICD-10-CM | POA: Diagnosis not present

## 2015-11-21 ENCOUNTER — Ambulatory Visit (INDEPENDENT_AMBULATORY_CARE_PROVIDER_SITE_OTHER): Payer: Medicare Other | Admitting: Neurology

## 2015-11-21 ENCOUNTER — Encounter: Payer: Self-pay | Admitting: Neurology

## 2015-11-21 VITALS — BP 120/80 | HR 82 | Ht 61.0 in | Wt 188.0 lb

## 2015-11-21 DIAGNOSIS — Z712 Person consulting for explanation of examination or test findings: Secondary | ICD-10-CM | POA: Diagnosis not present

## 2015-11-21 DIAGNOSIS — M79601 Pain in right arm: Secondary | ICD-10-CM

## 2015-11-21 DIAGNOSIS — R202 Paresthesia of skin: Secondary | ICD-10-CM | POA: Diagnosis not present

## 2015-11-21 NOTE — Patient Instructions (Addendum)
1.  NCS/EMG of the right arm 2.  We will call you with the results

## 2015-11-21 NOTE — Progress Notes (Signed)
Follow-up Visit   Date: 11/21/15    MILCAH DULANY MRN: [id number] DOB: [birth year]   Interim History: Yolanda Jones is a 35 y.o. right-handed Caucasian female with pituitary adenoma with hyperprolactinoma, HIV (diagnosed 2010, on HAART), PTSD, GAD, bipolar disorder, and hyperthyroidism returning to the clinic with new complaints of right arm pain.  The patient was accompanied to the clinic by self.   History of present illness: Initial presentation 09/02/2013:  She noticed difficulty with short and long term memory especially over the past several months. She forgets details of conversation and finds herself asking her friends and family members to repeat things and remind her. She is "really concerned for my future". She denies any auditory or visual hallucination. Her mood is good and is seeing Dr. [name] ([place]) and is seeing a therapist once every few weeks. She does not have transportation and is trying to use [employer] bus. She is repeating things, forgetting details of conversation, misplacing items, word-finding difficulties, and cannot multitask effectively.    UPDATE 05/24/2014:  She does not why she is here today.  She underwent neuropsychiatric testing in which found cognitive deficits in multiple domains and ultimately was diagnosed with unspecified cognitive disorder.  Her mother does report that her memory and behavior has significant improved since the summer of 2015 and attributes this to medication adjustment and her mother managing her medications.  She was taken off xanax 80m and switched to 0.570mprn for panic attacks. However, there patient still experiences memory problems daily.  For instance, her mother says that she can give her [place] will forget items.  She cannot manage her own medications and is not driving.  She can walk into a room and forget why she was there.  She cannot recall recent events.   She denies any hallucinations, but does endorose having psychotic break as "if you're on acid". She is not having psychotherapy because of travel difficulties.    UPDATE 11/21/2015:  She is feeling relatively well from a cognitive perspective.  Mood is good.  She was referred for new complains of right arm pain which has been present for many years.  She has dull achy pain over the triceps region and feels that the bone needs to be "popped". She endorses some weakness, but is still able do to all her usual activities. She also has tingling which radiates into the hand.  She sometimes has complete numbness.  She reports punching through a glass door many years ago while arguing with her sister and cut her arm in several areas.  Medications:  Current Outpatient Prescriptions on File Prior to Visit  Medication Sig Dispense Refill  . ALPRAZolam (XANAX) 0.5 MG tablet   3  . amphetamine-dextroamphetamine (ADDERALL) 20 MG tablet Take 20 mg by mouth 3 (three) times daily.    . benztropine (COGENTIN) 2 MG tablet Take 2 mg by mouth daily.    . Marland Kitchenlvitegravir-cobicistat-emtricitabine-tenofovir (GENVOYA) 150-150-200-10 MG TABS tablet Take 1 tablet by mouth daily. 30 tablet 11  . esomeprazole (NEXIUM) 20 MG capsule Take 40 mg by mouth daily before breakfast.     . estazolam (PROSOM) 2 MG tablet   2  . ibuprofen (ADVIL,MOTRIN) 400 MG tablet Take 1 tablet (400 mg total) by mouth every 6 (six) hours as needed. 30 tablet 0  . INVEGA 3 MG 24 hr tablet   0  . Multiple Vitamin (MULTIVITAMIN WITH MINERALS) TABS  tablet Take 1 tablet by mouth daily.    Marland Kitchen PRESCRIPTION MEDICATION 1 each. Paraguard IUD placed in 2006    . sertraline (ZOLOFT) 100 MG tablet Take 100 mg by mouth daily.    . valACYclovir (VALTREX) 500 MG tablet Take 1 tablet (500 mg total) by mouth daily. 30 tablet 11   No current facility-administered medications on file prior to visit.     Allergies:  Allergies  Allergen Reactions  . Meloxicam   .  Oxaprozin   . Strattera [Atomoxetine Hcl]   . Tramadol     Review of Systems:  CONSTITUTIONAL: No fevers, chills, night sweats, or weight loss.  EYES: No visual changes or eye pain ENT: No hearing changes.  No history of nose bleeds.   RESPIRATORY: No cough, wheezing and shortness of breath.   CARDIOVASCULAR: Negative for chest pain, and palpitations.   GI: Negative for abdominal discomfort, blood in stools or black stools.  No recent change in bowel habits.   GU:  No history of incontinence.   MUSCLOSKELETAL: No history of joint pain or swelling.  No myalgias.   SKIN: Negative for lesions, rash, and itching.   ENDOCRINE: Negative for cold or heat intolerance, polydipsia or goiter.   PSYCH: No depression +anxiety symptoms.   NEURO: As Above.   Vital Signs:  BP 120/80   Pulse 82   Ht _0  (1.549 m)   Wt 188 lb (85.3 kg)   BMI 35.52 kg/m     Neurological Exam: MENTAL STATUS including orientation to time, place, person is intact.  Attention span and concentration is improved.  She appears very scattered and restless, but much easier to redirect than previous visit.  CRANIAL NERVES:  No visual field defects.  Pupils equal round and reactive to light.  Normal conjugate, extra-ocular eye movements in all directions of gaze.  No ptosis. Normal facial sensation.  Face is symmetric. Palate elevates symmetrically.  Tongue is midline.  No pathological facial reflexes.   MOTOR:  Motor strength is 5/5 in all extremities.  No atrophy, fasciculations or abnormal movements.  No pronator drift.  Tone is normal.    MSRs:  Reflexes are 3+/4 throughout, except 2+/4 at the Achilles bilaterally.  SENSORY:  Intact to vibration, temperature and pin prick throughout.  COORDINATION/GAIT:  Normal finger-to- nose-finger.  Intact rapid alternating movements bilaterally.  Gait narrow based and stable.   Data: MRI brain wwo contrast 09/21/2013: Premature for age cerebral and cerebellar atrophy. No acute  intracranial findings. Motion degraded pituitary sequences demonstrate normal gland height without significant areas of differential enhancement. Today's scan resembles most closely the appearance of the brain on 05/25/2007 where no definite abnormality was observed.  MRI lumbar without contrast spine 10/29/2010: 1. Interval development a shallow right foraminal disc protrusion at L3-L4 causing possible right L3 nerve root encroachment.  2. Interval development of left foraminal disc protrusion at L4-L5 causing possible left L4 nerve root encroachment.  3. Interval development of right paracentral disc protrusion at L5- S1 without sacral or exiting L5 nerve root encroachment.  MRI brain with and without contrast 05/25/2007: Normal appearing brain  Stable appearance of the pituitary gland. 3 mm focus in the inferior aspect of the gland previously was questioned as a possible microadenoma. I am not sure of that, but certainly there has not been any change or progression.  XR cervical spine 03/08/2003: No acute abnormality  Lab Results  Component Value Date   VITAMINB12 324 05/24/2014   Labs 05/24/2014:  TSH 3.2, CRP < 0.5, ESR 22, vitamin B12 324, vitamin E 1.5, heavy metal screen neg  IMPRESSION: Ms. Stefano is a 35 year old female with psychiatric disorder, HIV positive, and polysubstance abuse returning with new complaints of right arm pain and weakness.  Her pain does not seem to have a radicular quality, as she has more dull achy pain and localized tenderness over the triceps.  Because she does have sensory complaints, NCS/EMG will be ordered.  If this is normal, she may need Sports Medicine evaluation of musculoskeletal pain.  Regarding her cognition, this seems to be significantly improved and most likely due to better control of her psychiatric health.  PLAN/RECOMMENDATIONS:  1.  NCS/EMG of the right arm 2.  We will call with the results of the testing and decide the next step  The  duration of this appointment visit was 20 minutes of face-to-face time with the patient.  Greater than 50% of this time was spent in counseling, explanation of diagnosis, planning of further management, and coordination of care.   Thank you for allowing me to participate in patient's care.  If I can answer any additional questions, I would be pleased to do so.    Sincerely,    Donika K. Posey Pronto, DO

## 2015-11-30 DIAGNOSIS — F3175 Bipolar disorder, in partial remission, most recent episode depressed: Secondary | ICD-10-CM | POA: Diagnosis not present

## 2015-11-30 DIAGNOSIS — F900 Attention-deficit hyperactivity disorder, predominantly inattentive type: Secondary | ICD-10-CM | POA: Diagnosis not present

## 2015-12-06 ENCOUNTER — Ambulatory Visit (INDEPENDENT_AMBULATORY_CARE_PROVIDER_SITE_OTHER): Payer: Medicare Other | Admitting: Neurology

## 2015-12-06 ENCOUNTER — Telehealth: Payer: Self-pay | Admitting: *Deleted

## 2015-12-06 DIAGNOSIS — M79601 Pain in right arm: Secondary | ICD-10-CM | POA: Diagnosis not present

## 2015-12-06 DIAGNOSIS — R202 Paresthesia of skin: Secondary | ICD-10-CM

## 2015-12-06 NOTE — Telephone Encounter (Signed)
Patient walked into clinic requesting an appt and stating she had a private matter to discuss with a nurse. Patient said she had just left her neuro office and had a nerve conduction test done and tried to explain to the MD that she is sometimes in a lot of pain and was told they do not prescribe any pain meds. Explained that Dr. Linus Salmons does not either and if she needs a pain clinic, maybe that can be arranged. She said she felt like she just needs a PCP, because she had been using the Urgent Care. Called [facility] [place] and Dr. Clayton Bibles. Is taking new patient's but wants to review the notes first. Faxed last two office notes, MRI, labs, demo, and will contact patient if she is able to be scheduled. She again wanted to let Dr. Linus Salmons know how much she appreciates his kindness and understanding and is very happy with the care she gets at this clinic.

## 2015-12-06 NOTE — Procedures (Signed)
[facility]  [address]  [place], [place] [address] Tel: [phone] Fax:  [phone] Test Date:  12/06/2015  Patient: Yolanda Jones DOB: 1980/05/31 Physician: Narda Amber, DO  Sex: Female Height: 5' " Ref Phys: Narda Amber, DO  ID#: [id number] Temp: 33.0C Technician: Jerilynn Mages. Dean   Patient Complaints: This is a 35 year old with HIV referred for evaluation of right shoulder and arm achy pain as well as hand paresthesias.   NCV & EMG Findings: Extensive electrodiagnostic testing of the right upper extremity shows:  1. All sensory responses including the right median, ulnar, and mixed palmar responses are within normal limits. 2. All motor responses including the right median and ulnar nerves are within normal limits. 3. There is no evidence of active or chronic motor axon loss changes affecting any of the tested muscles. Motor unit configuration and recruitment pattern is within normal limits.  Impression: This is a normal study of the right upper extremity. In particular, there is no evidence of carpal tunnel syndrome or cervical radiculopathy.   ___________________________ Narda Amber, DO    Nerve Conduction Studies Anti Sensory Summary Table   Site NR Peak (ms) Norm Peak (ms) P-T Amp (V) Norm P-T Amp  Right Median Anti Sensory (2nd Digit)  Wrist    2.9 <3.4 37.3 >20  Right Ulnar Anti Sensory (5th Digit)  Wrist    2.7 <3.1 23.6 >12   Motor Summary Table   Site NR Onset (ms) Norm Onset (ms) O-P Amp (mV) Norm O-P Amp Site1 Site2 Delta-0 (ms) Dist (cm) Vel (m/s) Norm Vel (m/s)  Right Median Motor (Abd Poll Brev)  Wrist    2.9 <3.9 9.8 >6 Elbow Wrist 3.4 20.0 59 >50  Elbow    6.3  9.8         Right Ulnar Motor (Abd Dig Minimi)  Wrist    2.4 <3.1 8.3 >7 B Elbow Wrist 2.5 16.0 64 >50  B Elbow    4.9  7.1  A Elbow B Elbow 1.5 10.0 67 >50  A Elbow    6.4  7.1          Comparison Summary Table   Site NR Peak (ms) Norm Peak (ms) P-T Amp (V)  Site1 Site2 Delta-P (ms) Norm Delta (ms)  Right Median/Ulnar Palm Comparison (Wrist - 8cm)  33.2C  Median Palm    1.8 <2.2 89.6 Median Palm Ulnar Palm 0.1   Ulnar Palm    1.7 <2.2 18.3        EMG   Side Muscle Ins Act Fibs Psw Fasc Number Recrt Dur Dur. Amp Amp. Poly Poly. Comment  Right 1stDorInt Nml Nml Nml Nml Nml Nml Nml Nml Nml Nml Nml Nml N/A  Right Ext Indicis Nml Nml Nml Nml Nml Nml Nml Nml Nml Nml Nml Nml N/A  Right PronatorTeres Nml Nml Nml Nml Nml Nml Nml Nml Nml Nml Nml Nml N/A  Right Biceps Nml Nml Nml Nml Nml Nml Nml Nml Nml Nml Nml Nml N/A  Right Triceps Nml Nml Nml Nml Nml Nml Nml Nml Nml Nml Nml Nml N/A  Right Deltoid Nml Nml Nml Nml Nml Nml Nml Nml Nml Nml Nml Nml N/A      Waveforms:

## 2015-12-07 ENCOUNTER — Encounter: Payer: Self-pay | Admitting: *Deleted

## 2015-12-10 ENCOUNTER — Telehealth: Payer: Self-pay | Admitting: *Deleted

## 2015-12-10 NOTE — Telephone Encounter (Signed)
Call from [place] with Sadie [facility]er at [facility] regarding new PCP referral. Patient is already established at [facility] and also sees [facility]. Patient told me she did not have a PCP and was seen at Urgent Care for PCP frequently. Call to patient and had to leave a message for Kseniya to return my call. Does she have a reason to not continue being seen at [facility], if so we may be able to pursue another office.

## 2015-12-14 ENCOUNTER — Telehealth: Payer: Self-pay | Admitting: Neurology

## 2015-12-14 NOTE — Telephone Encounter (Signed)
[phone] patient wants to know the results on the test please call her

## 2015-12-17 ENCOUNTER — Encounter: Payer: Self-pay | Admitting: *Deleted

## 2015-12-17 NOTE — Telephone Encounter (Signed)
Patient called returning call from [name] advised of situation with [facility] PCP. She advised she wants to transfer to [facility]. Advised her of the number and advised her to call them to find out their policy and schedule an appt herself. She advised she will do and call back if she has any trouble.

## 2015-12-17 NOTE — Telephone Encounter (Signed)
Please advise 

## 2015-12-17 NOTE — Telephone Encounter (Signed)
Normal EMG. No evidence of nerve or muscle testing.

## 2015-12-17 NOTE — Telephone Encounter (Signed)
Note sent via My Chart informing patient.

## 2015-12-21 DIAGNOSIS — F900 Attention-deficit hyperactivity disorder, predominantly inattentive type: Secondary | ICD-10-CM | POA: Diagnosis not present

## 2015-12-21 DIAGNOSIS — F3175 Bipolar disorder, in partial remission, most recent episode depressed: Secondary | ICD-10-CM | POA: Diagnosis not present

## 2015-12-24 ENCOUNTER — Encounter (HOSPITAL_COMMUNITY): Payer: Self-pay | Admitting: Emergency Medicine

## 2015-12-24 ENCOUNTER — Emergency Department (HOSPITAL_COMMUNITY)
Admission: EM | Admit: 2015-12-24 | Discharge: 2015-12-24 | Disposition: A | Discharge status: Home / Self Care | Payer: Medicare Other | Attending: Emergency Medicine | Admitting: Emergency Medicine

## 2015-12-24 DIAGNOSIS — M79601 Pain in right arm: Secondary | ICD-10-CM | POA: Insufficient documentation

## 2015-12-24 DIAGNOSIS — N182 Chronic kidney disease, stage 2 (mild): Secondary | ICD-10-CM | POA: Diagnosis not present

## 2015-12-24 DIAGNOSIS — Z79899 Other long term (current) drug therapy: Secondary | ICD-10-CM | POA: Insufficient documentation

## 2015-12-24 MED ORDER — DEXAMETHASONE SODIUM PHOSPHATE 10 MG/ML IJ SOLN
10.0000 mg | Freq: Once | INTRAMUSCULAR | Status: DC
  Filled 2015-12-24: qty 1

## 2015-12-24 MED ORDER — PREDNISONE 10 MG (21) PO TBPK: 10.0000 mg | ORAL_TABLET | Freq: Every day | ORAL | 0 refills | Status: DC

## 2015-12-24 NOTE — ED Notes (Signed)
Pt is in stable condition upon d/c and ambulates from ED. 

## 2015-12-24 NOTE — ED Triage Notes (Signed)
Pt sts right arm pain that is chronic in nature with numbness; pt sts seen neurologist last week for same
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
## 2015-12-26 DIAGNOSIS — F3175 Bipolar disorder, in partial remission, most recent episode depressed: Secondary | ICD-10-CM | POA: Diagnosis not present

## 2015-12-27 ENCOUNTER — Ambulatory Visit: Payer: Medicare Other | Admitting: Internal Medicine

## 2016-02-11 ENCOUNTER — Encounter (HOSPITAL_COMMUNITY): Payer: Self-pay | Admitting: Emergency Medicine

## 2016-02-11 ENCOUNTER — Emergency Department (HOSPITAL_COMMUNITY)
Admission: EM | Admit: 2016-02-11 | Discharge: 2016-02-11 | Disposition: A | Discharge status: Home / Self Care | Payer: Medicare Other | Attending: Emergency Medicine | Admitting: Emergency Medicine

## 2016-02-11 DIAGNOSIS — G8929 Other chronic pain: Secondary | ICD-10-CM | POA: Insufficient documentation

## 2016-02-11 DIAGNOSIS — Z79899 Other long term (current) drug therapy: Secondary | ICD-10-CM | POA: Insufficient documentation

## 2016-02-11 DIAGNOSIS — M545 Low back pain: Secondary | ICD-10-CM | POA: Insufficient documentation

## 2016-02-11 MED ORDER — METHOCARBAMOL 500 MG PO TABS
750.0000 mg | ORAL_TABLET | Freq: Once | ORAL | Status: AC
  Administered 2016-02-11: 750 mg via ORAL
  Filled 2016-02-11: qty 2

## 2016-02-11 MED ORDER — KETOROLAC TROMETHAMINE 60 MG/2ML IM SOLN
30.0000 mg | Freq: Once | INTRAMUSCULAR | Status: AC
  Administered 2016-02-11: 30 mg via INTRAMUSCULAR
  Filled 2016-02-11: qty 2

## 2016-02-11 MED ORDER — METHOCARBAMOL 500 MG PO TABS: ORAL_TABLET | ORAL | 0 refills | Status: DC

## 2016-02-11 MED ORDER — DICLOFENAC SODIUM 1 % TD GEL: 2.0000 g | Freq: Four times a day (QID) | TRANSDERMAL | 0 refills | Status: DC
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
## 2016-02-11 NOTE — ED Triage Notes (Signed)
Patient reports lower back pain starting yesterday. Denies trauma/injury. Denies numbness/tingling to extremities or bowel/bladder issues.

## 2016-02-11 NOTE — Discharge Instructions (Signed)
Please follow with your primary care doctor in the next 2 days for a check-up. They must obtain records for further management.  ° °Do not hesitate to return to the Emergency Department for any new, worsening or concerning symptoms.  ° °

## 2016-02-14 DIAGNOSIS — E221 Hyperprolactinemia: Secondary | ICD-10-CM | POA: Diagnosis not present

## 2016-02-18 ENCOUNTER — Ambulatory Visit: Payer: Medicare Other | Admitting: Neurology

## 2016-02-18 DIAGNOSIS — M2550 Pain in unspecified joint: Secondary | ICD-10-CM | POA: Diagnosis not present

## 2016-02-21 DIAGNOSIS — M47812 Spondylosis without myelopathy or radiculopathy, cervical region: Secondary | ICD-10-CM | POA: Diagnosis not present

## 2016-02-21 DIAGNOSIS — M2550 Pain in unspecified joint: Secondary | ICD-10-CM | POA: Diagnosis not present

## 2016-02-21 DIAGNOSIS — E221 Hyperprolactinemia: Secondary | ICD-10-CM | POA: Diagnosis not present

## 2016-02-21 DIAGNOSIS — M25511 Pain in right shoulder: Secondary | ICD-10-CM | POA: Diagnosis not present

## 2016-02-21 DIAGNOSIS — M542 Cervicalgia: Secondary | ICD-10-CM | POA: Diagnosis not present

## 2016-02-26 DIAGNOSIS — G3184 Mild cognitive impairment, so stated: Secondary | ICD-10-CM | POA: Diagnosis not present

## 2016-02-26 DIAGNOSIS — F3175 Bipolar disorder, in partial remission, most recent episode depressed: Secondary | ICD-10-CM | POA: Diagnosis not present

## 2016-02-26 DIAGNOSIS — F900 Attention-deficit hyperactivity disorder, predominantly inattentive type: Secondary | ICD-10-CM | POA: Diagnosis not present

## 2016-03-05 DIAGNOSIS — F900 Attention-deficit hyperactivity disorder, predominantly inattentive type: Secondary | ICD-10-CM | POA: Diagnosis not present

## 2016-03-05 DIAGNOSIS — G3184 Mild cognitive impairment, so stated: Secondary | ICD-10-CM | POA: Diagnosis not present

## 2016-03-05 DIAGNOSIS — F3175 Bipolar disorder, in partial remission, most recent episode depressed: Secondary | ICD-10-CM | POA: Diagnosis not present

## 2016-03-12 DIAGNOSIS — F3175 Bipolar disorder, in partial remission, most recent episode depressed: Secondary | ICD-10-CM | POA: Diagnosis not present

## 2016-03-13 ENCOUNTER — Ambulatory Visit (INDEPENDENT_AMBULATORY_CARE_PROVIDER_SITE_OTHER): Payer: Medicare Other | Admitting: Internal Medicine

## 2016-03-13 ENCOUNTER — Encounter: Payer: Self-pay | Admitting: Internal Medicine

## 2016-03-13 VITALS — BP 103/69 | HR 108 | Temp 98.5°F | Ht 65.0 in | Wt 195.0 lb

## 2016-03-13 DIAGNOSIS — N182 Chronic kidney disease, stage 2 (mild): Secondary | ICD-10-CM | POA: Diagnosis not present

## 2016-03-13 DIAGNOSIS — Z79899 Other long term (current) drug therapy: Secondary | ICD-10-CM | POA: Diagnosis not present

## 2016-03-13 DIAGNOSIS — B20 Human immunodeficiency virus [HIV] disease: Secondary | ICD-10-CM

## 2016-03-13 DIAGNOSIS — Z113 Encounter for screening for infections with a predominantly sexual mode of transmission: Secondary | ICD-10-CM

## 2016-03-13 LAB — COMPLETE METABOLIC PANEL WITH GFR
ALT: 10 U/L (ref 6–29)
AST: 18 U/L (ref 10–30)
Albumin: 3.6 g/dL (ref 3.6–5.1)
Alkaline Phosphatase: 54 U/L (ref 33–115)
BILIRUBIN TOTAL: 0.2 mg/dL (ref 0.2–1.2)
BUN: 12 mg/dL (ref 7–25)
CALCIUM: 9.4 mg/dL (ref 8.6–10.2)
CO2: 28 mmol/L (ref 20–31)
Chloride: 101 mmol/L (ref 98–110)
Creat: 1.63 mg/dL — ABNORMAL HIGH (ref 0.50–1.10)
GFR, EST AFRICAN AMERICAN: 46 mL/min — AB (ref >=60)
GFR, Est Non African American: 40 mL/min — ABNORMAL LOW (ref >=60)
Glucose, Bld: 75 mg/dL (ref 65–99)
Potassium: 3.8 mmol/L (ref 3.5–5.3)
Sodium: 138 mmol/L (ref 135–146)
TOTAL PROTEIN: 6.7 g/dL (ref 6.1–8.1)

## 2016-03-13 LAB — LIPID PANEL
CHOL/HDL RATIO: 7.3 ratio — AB (ref <5.0)
Cholesterol: 247 mg/dL — ABNORMAL HIGH (ref <200)
HDL: 34 mg/dL — AB (ref >50)
LDL Cholesterol: 135 mg/dL — ABNORMAL HIGH (ref <100)
Triglycerides: 389 mg/dL — ABNORMAL HIGH (ref <150)
VLDL: 78 mg/dL — ABNORMAL HIGH (ref <30)

## 2016-03-13 LAB — CBC WITH DIFFERENTIAL/PLATELET
Basophils Absolute: 61 cells/uL (ref 0–200)
Basophils Relative: 1 %
EOS ABS: 244 {cells}/uL (ref 15–500)
Eosinophils Relative: 4 %
HCT: 33 % — ABNORMAL LOW (ref 35.0–45.0)
Hemoglobin: 10.6 g/dL — ABNORMAL LOW (ref 11.7–15.5)
LYMPHS PCT: 31 %
Lymphs Abs: 1891 cells/uL (ref 850–3900)
MCH: 29.4 pg (ref 27.0–33.0)
MCHC: 32.1 g/dL (ref 32.0–36.0)
MCV: 91.7 fL (ref 80.0–100.0)
MONOS PCT: 7 %
MPV: 10.4 fL (ref 7.5–12.5)
Monocytes Absolute: 427 cells/uL (ref 200–950)
NEUTROS ABS: 3477 {cells}/uL (ref 1500–7800)
Neutrophils Relative %: 57 %
PLATELETS: 325 10*3/uL (ref 140–400)
RBC: 3.6 MIL/uL — ABNORMAL LOW (ref 3.80–5.10)
RDW: 15.3 % — AB (ref 11.0–15.0)
WBC: 6.1 10*3/uL (ref 3.8–10.8)

## 2016-03-13 NOTE — Assessment & Plan Note (Signed)
Screening today 

## 2016-03-13 NOTE — Progress Notes (Signed)
  Subjective:    Patient ID: Yolanda Jones, female    DOB: November 20, 1980, 36 y.o.   MRN: [id number]  HPI She comes in for followup of her HIV.  She continues on Genvoya.  She does see psychiatry and see her primary physician.  No labs prior to visit.  She did miss two doses in the last 6 months.  No weight loss, no diarrhea.   She has noted she is dragging more.  Sleeping well but less active.  Wonders if it is the medication.  Sexually active with a new female partner and moving in with him.  He is aware of her status, uses condoms always.      Review of Systems  Constitutional: Negative for fatigue.  HENT: Negative for sore throat and trouble swallowing.        Dry mouth  Gastrointestinal: Negative for diarrhea.  Skin: Negative for rash.  Neurological: Negative for dizziness and headaches.  Psychiatric/Behavioral: Negative for dysphoric mood. The patient is not hyperactive.        Objective:   Physical Exam  Constitutional: She appears well-developed and well-nourished.  HENT:  Mouth/Throat: No oropharyngeal exudate.  Eyes: Right eye exhibits no discharge. Left eye exhibits no discharge. No scleral icterus.  Cardiovascular: Normal rate, regular rhythm and normal heart sounds.   No murmur heard. Pulmonary/Chest: Effort normal and breath sounds normal. No respiratory distress.  Lymphadenopathy:    She has no cervical adenopathy.  Skin: No rash noted.    SH: new partner, no alcohol     Assessment & Plan:

## 2016-03-13 NOTE — Assessment & Plan Note (Signed)
Will monitor creat today, on TAF about 1 year.

## 2016-03-13 NOTE — Assessment & Plan Note (Signed)
Doing well overall. Counseled on not missing doses.  Labs today and rtc 6 months.

## 2016-03-14 LAB — RPR

## 2016-03-14 LAB — T-HELPER CELL (CD4) - (RCID CLINIC ONLY)
CD4 % Helper T Cell: 51 % (ref 33–55)
CD4 T Cell Abs: 1060 /uL (ref 400–2700)

## 2016-03-17 DIAGNOSIS — M542 Cervicalgia: Secondary | ICD-10-CM | POA: Diagnosis not present

## 2016-03-17 DIAGNOSIS — M25571 Pain in right ankle and joints of right foot: Secondary | ICD-10-CM | POA: Diagnosis not present

## 2016-03-17 DIAGNOSIS — G8929 Other chronic pain: Secondary | ICD-10-CM | POA: Diagnosis not present

## 2016-03-17 LAB — HIV-1 RNA QUANT-NO REFLEX-BLD
HIV 1 RNA QUANT: NOT DETECTED {copies}/mL
HIV-1 RNA QUANT, LOG: NOT DETECTED {Log_copies}/mL

## 2016-03-18 DIAGNOSIS — F3175 Bipolar disorder, in partial remission, most recent episode depressed: Secondary | ICD-10-CM | POA: Diagnosis not present

## 2016-03-25 ENCOUNTER — Encounter: Payer: Self-pay | Admitting: Physical Therapy

## 2016-03-25 ENCOUNTER — Ambulatory Visit: Payer: Medicare Other | Attending: Family Medicine | Admitting: Physical Therapy

## 2016-03-25 DIAGNOSIS — M25511 Pain in right shoulder: Secondary | ICD-10-CM | POA: Diagnosis not present

## 2016-03-25 DIAGNOSIS — M542 Cervicalgia: Secondary | ICD-10-CM | POA: Diagnosis not present

## 2016-03-25 DIAGNOSIS — R262 Difficulty in walking, not elsewhere classified: Secondary | ICD-10-CM | POA: Diagnosis not present

## 2016-03-25 DIAGNOSIS — M6281 Muscle weakness (generalized): Secondary | ICD-10-CM | POA: Insufficient documentation

## 2016-03-25 DIAGNOSIS — G8929 Other chronic pain: Secondary | ICD-10-CM | POA: Insufficient documentation

## 2016-03-25 DIAGNOSIS — F3175 Bipolar disorder, in partial remission, most recent episode depressed: Secondary | ICD-10-CM | POA: Diagnosis not present
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
## 2016-04-01 ENCOUNTER — Ambulatory Visit (INDEPENDENT_AMBULATORY_CARE_PROVIDER_SITE_OTHER): Payer: Medicare Other | Admitting: Orthopedic Surgery

## 2016-04-01 ENCOUNTER — Ambulatory Visit (INDEPENDENT_AMBULATORY_CARE_PROVIDER_SITE_OTHER): Payer: Medicare Other

## 2016-04-01 ENCOUNTER — Encounter (INDEPENDENT_AMBULATORY_CARE_PROVIDER_SITE_OTHER): Payer: Self-pay | Admitting: Orthopedic Surgery

## 2016-04-01 VITALS — BP 109/87 | HR 102

## 2016-04-01 DIAGNOSIS — M25571 Pain in right ankle and joints of right foot: Secondary | ICD-10-CM

## 2016-04-01 DIAGNOSIS — M4302 Spondylolysis, cervical region: Secondary | ICD-10-CM

## 2016-04-01 DIAGNOSIS — F3175 Bipolar disorder, in partial remission, most recent episode depressed: Secondary | ICD-10-CM | POA: Diagnosis not present

## 2016-04-01 MED ORDER — HYDROCODONE-ACETAMINOPHEN 10-325 MG PO TABS: 1.0000 | ORAL_TABLET | Freq: Four times a day (QID) | ORAL | 0 refills | Status: DC | PRN
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
## 2016-04-07 ENCOUNTER — Ambulatory Visit (INDEPENDENT_AMBULATORY_CARE_PROVIDER_SITE_OTHER): Payer: Medicare Other | Admitting: Orthopedic Surgery

## 2016-04-07 ENCOUNTER — Encounter (INDEPENDENT_AMBULATORY_CARE_PROVIDER_SITE_OTHER): Payer: Self-pay | Admitting: Orthopedic Surgery

## 2016-04-07 VITALS — Ht 65.0 in | Wt 195.0 lb

## 2016-04-07 DIAGNOSIS — M4302 Spondylolysis, cervical region: Secondary | ICD-10-CM | POA: Diagnosis not present

## 2016-04-07 MED ORDER — PREDNISONE 10 MG PO TABS: 20.0000 mg | ORAL_TABLET | Freq: Every day | ORAL | 0 refills | Status: DC
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
## 2016-04-08 DIAGNOSIS — F3175 Bipolar disorder, in partial remission, most recent episode depressed: Secondary | ICD-10-CM | POA: Diagnosis not present

## 2016-04-15 DIAGNOSIS — F3175 Bipolar disorder, in partial remission, most recent episode depressed: Secondary | ICD-10-CM | POA: Diagnosis not present

## 2016-04-21 ENCOUNTER — Encounter (INDEPENDENT_AMBULATORY_CARE_PROVIDER_SITE_OTHER): Payer: Self-pay | Admitting: Orthopedic Surgery

## 2016-04-21 ENCOUNTER — Ambulatory Visit (INDEPENDENT_AMBULATORY_CARE_PROVIDER_SITE_OTHER): Payer: Medicare Other | Admitting: Orthopedic Surgery

## 2016-04-21 DIAGNOSIS — M4302 Spondylolysis, cervical region: Secondary | ICD-10-CM | POA: Diagnosis not present
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
## 2016-04-22 DIAGNOSIS — F3175 Bipolar disorder, in partial remission, most recent episode depressed: Secondary | ICD-10-CM | POA: Diagnosis not present

## 2016-04-23 DIAGNOSIS — R12 Heartburn: Secondary | ICD-10-CM | POA: Diagnosis not present

## 2016-04-23 DIAGNOSIS — K3184 Gastroparesis: Secondary | ICD-10-CM | POA: Diagnosis not present

## 2016-04-24 ENCOUNTER — Other Ambulatory Visit (HOSPITAL_COMMUNITY): Payer: Self-pay | Admitting: Gastroenterology

## 2016-04-24 DIAGNOSIS — K3184 Gastroparesis: Secondary | ICD-10-CM

## 2016-04-29 DIAGNOSIS — F3175 Bipolar disorder, in partial remission, most recent episode depressed: Secondary | ICD-10-CM | POA: Diagnosis not present

## 2016-05-01 ENCOUNTER — Encounter (HOSPITAL_COMMUNITY): Payer: Self-pay | Admitting: Radiology

## 2016-05-01 ENCOUNTER — Ambulatory Visit (HOSPITAL_COMMUNITY)
Admission: RE | Admit: 2016-05-01 | Discharge: 2016-05-01 | Disposition: A | Discharge status: Home / Self Care | Payer: Medicare Other | Source: Ambulatory Visit | Attending: Gastroenterology | Admitting: Gastroenterology

## 2016-05-01 DIAGNOSIS — K3184 Gastroparesis: Secondary | ICD-10-CM | POA: Insufficient documentation

## 2016-05-01 MED ORDER — TECHNETIUM TC 99M SULFUR COLLOID
1.9000 | Freq: Once | INTRAVENOUS | Status: AC
  Administered 2016-05-01: 1.9 via ORAL

## 2016-05-07 DIAGNOSIS — R87619 Unspecified abnormal cytological findings in specimens from cervix uteri: Secondary | ICD-10-CM | POA: Diagnosis not present

## 2016-05-07 DIAGNOSIS — E221 Hyperprolactinemia: Secondary | ICD-10-CM | POA: Diagnosis not present

## 2016-05-07 DIAGNOSIS — N912 Amenorrhea, unspecified: Secondary | ICD-10-CM | POA: Diagnosis not present

## 2016-05-07 DIAGNOSIS — R87610 Atypical squamous cells of undetermined significance on cytologic smear of cervix (ASC-US): Secondary | ICD-10-CM | POA: Diagnosis not present

## 2016-05-12 DIAGNOSIS — E669 Obesity, unspecified: Secondary | ICD-10-CM | POA: Diagnosis not present

## 2016-05-12 DIAGNOSIS — M542 Cervicalgia: Secondary | ICD-10-CM | POA: Diagnosis not present

## 2016-05-13 DIAGNOSIS — F3175 Bipolar disorder, in partial remission, most recent episode depressed: Secondary | ICD-10-CM | POA: Diagnosis not present

## 2016-05-14 DIAGNOSIS — R87610 Atypical squamous cells of undetermined significance on cytologic smear of cervix (ASC-US): Secondary | ICD-10-CM | POA: Diagnosis not present

## 2016-05-14 DIAGNOSIS — Z30430 Encounter for insertion of intrauterine contraceptive device: Secondary | ICD-10-CM | POA: Diagnosis not present

## 2016-05-14 DIAGNOSIS — R233 Spontaneous ecchymoses: Secondary | ICD-10-CM | POA: Diagnosis not present

## 2016-05-14 DIAGNOSIS — R58 Hemorrhage, not elsewhere classified: Secondary | ICD-10-CM | POA: Diagnosis not present

## 2016-05-14 DIAGNOSIS — M7981 Nontraumatic hematoma of soft tissue: Secondary | ICD-10-CM | POA: Diagnosis not present

## 2016-05-14 DIAGNOSIS — Z30432 Encounter for removal of intrauterine contraceptive device: Secondary | ICD-10-CM | POA: Diagnosis not present

## 2016-05-14 DIAGNOSIS — Z01419 Encounter for gynecological examination (general) (routine) without abnormal findings: Secondary | ICD-10-CM | POA: Diagnosis not present

## 2016-05-14 DIAGNOSIS — N912 Amenorrhea, unspecified: Secondary | ICD-10-CM | POA: Diagnosis not present

## 2016-05-14 DIAGNOSIS — Z30433 Encounter for removal and reinsertion of intrauterine contraceptive device: Secondary | ICD-10-CM | POA: Diagnosis not present

## 2016-05-19 DIAGNOSIS — K219 Gastro-esophageal reflux disease without esophagitis: Secondary | ICD-10-CM | POA: Diagnosis not present

## 2016-05-19 DIAGNOSIS — K5900 Constipation, unspecified: Secondary | ICD-10-CM | POA: Diagnosis not present

## 2016-05-21 DIAGNOSIS — F3175 Bipolar disorder, in partial remission, most recent episode depressed: Secondary | ICD-10-CM | POA: Diagnosis not present

## 2016-05-26 DIAGNOSIS — F900 Attention-deficit hyperactivity disorder, predominantly inattentive type: Secondary | ICD-10-CM | POA: Diagnosis not present

## 2016-05-26 DIAGNOSIS — F3175 Bipolar disorder, in partial remission, most recent episode depressed: Secondary | ICD-10-CM | POA: Diagnosis not present

## 2016-05-28 DIAGNOSIS — F3175 Bipolar disorder, in partial remission, most recent episode depressed: Secondary | ICD-10-CM | POA: Diagnosis not present

## 2016-05-28 DIAGNOSIS — F900 Attention-deficit hyperactivity disorder, predominantly inattentive type: Secondary | ICD-10-CM | POA: Diagnosis not present

## 2016-06-02 ENCOUNTER — Other Ambulatory Visit: Payer: Self-pay | Admitting: Obstetrics and Gynecology

## 2016-06-02 DIAGNOSIS — R87610 Atypical squamous cells of undetermined significance on cytologic smear of cervix (ASC-US): Secondary | ICD-10-CM | POA: Diagnosis not present

## 2016-06-02 DIAGNOSIS — N870 Mild cervical dysplasia: Secondary | ICD-10-CM | POA: Diagnosis not present

## 2016-06-02 DIAGNOSIS — Z30431 Encounter for routine checking of intrauterine contraceptive device: Secondary | ICD-10-CM | POA: Diagnosis not present

## 2016-06-04 DIAGNOSIS — F3175 Bipolar disorder, in partial remission, most recent episode depressed: Secondary | ICD-10-CM | POA: Diagnosis not present

## 2016-06-04 DIAGNOSIS — F900 Attention-deficit hyperactivity disorder, predominantly inattentive type: Secondary | ICD-10-CM | POA: Diagnosis not present

## 2016-06-11 DIAGNOSIS — F900 Attention-deficit hyperactivity disorder, predominantly inattentive type: Secondary | ICD-10-CM | POA: Diagnosis not present

## 2016-06-11 DIAGNOSIS — F3175 Bipolar disorder, in partial remission, most recent episode depressed: Secondary | ICD-10-CM | POA: Diagnosis not present

## 2016-06-13 DIAGNOSIS — E221 Hyperprolactinemia: Secondary | ICD-10-CM | POA: Diagnosis not present

## 2016-06-18 DIAGNOSIS — F3175 Bipolar disorder, in partial remission, most recent episode depressed: Secondary | ICD-10-CM | POA: Diagnosis not present

## 2016-06-18 DIAGNOSIS — F900 Attention-deficit hyperactivity disorder, predominantly inattentive type: Secondary | ICD-10-CM | POA: Diagnosis not present

## 2016-06-24 ENCOUNTER — Other Ambulatory Visit: Payer: Self-pay | Admitting: Internal Medicine

## 2016-06-24 DIAGNOSIS — B20 Human immunodeficiency virus [HIV] disease: Secondary | ICD-10-CM

## 2016-06-25 DIAGNOSIS — F3175 Bipolar disorder, in partial remission, most recent episode depressed: Secondary | ICD-10-CM | POA: Diagnosis not present

## 2016-06-25 DIAGNOSIS — F900 Attention-deficit hyperactivity disorder, predominantly inattentive type: Secondary | ICD-10-CM | POA: Diagnosis not present

## 2016-07-01 ENCOUNTER — Other Ambulatory Visit: Payer: Self-pay | Admitting: Gastroenterology

## 2016-07-01 DIAGNOSIS — R1013 Epigastric pain: Secondary | ICD-10-CM

## 2016-07-02 DIAGNOSIS — F900 Attention-deficit hyperactivity disorder, predominantly inattentive type: Secondary | ICD-10-CM | POA: Diagnosis not present

## 2016-07-02 DIAGNOSIS — N870 Mild cervical dysplasia: Secondary | ICD-10-CM | POA: Diagnosis not present

## 2016-07-02 DIAGNOSIS — B20 Human immunodeficiency virus [HIV] disease: Secondary | ICD-10-CM | POA: Diagnosis not present

## 2016-07-02 DIAGNOSIS — F3175 Bipolar disorder, in partial remission, most recent episode depressed: Secondary | ICD-10-CM | POA: Diagnosis not present

## 2016-07-04 ENCOUNTER — Ambulatory Visit
Admission: RE | Admit: 2016-07-04 | Discharge: 2016-07-04 | Disposition: A | Discharge status: Home / Self Care | Payer: Medicare Other | Source: Ambulatory Visit | Attending: Gastroenterology | Admitting: Gastroenterology

## 2016-07-04 DIAGNOSIS — R1013 Epigastric pain: Secondary | ICD-10-CM

## 2016-07-07 ENCOUNTER — Other Ambulatory Visit: Payer: Self-pay | Admitting: Internal Medicine

## 2016-07-08 ENCOUNTER — Other Ambulatory Visit: Payer: Medicare Other

## 2016-07-09 DIAGNOSIS — F3175 Bipolar disorder, in partial remission, most recent episode depressed: Secondary | ICD-10-CM | POA: Diagnosis not present

## 2016-07-09 DIAGNOSIS — F900 Attention-deficit hyperactivity disorder, predominantly inattentive type: Secondary | ICD-10-CM | POA: Diagnosis not present

## 2016-07-14 ENCOUNTER — Telehealth: Payer: Self-pay | Admitting: *Deleted

## 2016-07-14 NOTE — Telephone Encounter (Signed)
Patient OB/GYN called back. She is having a procedure done and the last labs and office visit was faxed to [phone]; confirmation received. Yolanda Jones

## 2016-07-14 NOTE — Telephone Encounter (Signed)
[name] with Dr. Ruffin [facility] office called requesting recent viral load and CD4 on mutual patient. [phone], ext [phone]. Called back and got her voice mail and advised her to return call and provide a fax # to send these results. Yolanda Jones

## 2016-07-15 ENCOUNTER — Encounter (HOSPITAL_COMMUNITY): Payer: Self-pay

## 2016-07-15 ENCOUNTER — Emergency Department (HOSPITAL_COMMUNITY)
Admission: EM | Admit: 2016-07-15 | Discharge: 2016-07-16 | Disposition: A | Discharge status: Home / Self Care | Payer: Medicare Other | Attending: Emergency Medicine | Admitting: Emergency Medicine

## 2016-07-15 DIAGNOSIS — N182 Chronic kidney disease, stage 2 (mild): Secondary | ICD-10-CM | POA: Insufficient documentation

## 2016-07-15 DIAGNOSIS — Z79899 Other long term (current) drug therapy: Secondary | ICD-10-CM | POA: Insufficient documentation

## 2016-07-15 DIAGNOSIS — K0889 Other specified disorders of teeth and supporting structures: Secondary | ICD-10-CM | POA: Diagnosis not present

## 2016-07-15 DIAGNOSIS — K029 Dental caries, unspecified: Secondary | ICD-10-CM | POA: Diagnosis not present

## 2016-07-15 MED ORDER — BUPIVACAINE-EPINEPHRINE (PF) 0.5% -1:200000 IJ SOLN
1.8000 mL | Freq: Once | INTRAMUSCULAR | Status: AC
  Administered 2016-07-15: 1.8 mL
  Filled 2016-07-15: qty 1.8
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
## 2016-07-15 NOTE — ED Triage Notes (Signed)
Pt reports left sided dental pain and jaw pain for three days

## 2016-07-15 NOTE — ED Notes (Signed)
Pt changed to acuity 3 based on HR of 127-135

## 2016-07-16 DIAGNOSIS — F3175 Bipolar disorder, in partial remission, most recent episode depressed: Secondary | ICD-10-CM | POA: Diagnosis not present

## 2016-07-16 MED ORDER — HYDROCODONE-ACETAMINOPHEN 5-325 MG PO TABS: 1.0000 | ORAL_TABLET | Freq: Four times a day (QID) | ORAL | 0 refills | Status: DC | PRN

## 2016-07-16 NOTE — Discharge Instructions (Signed)
You've been given a dental referral.  Please call the dentist first thing in the morning, telling them that you are referred to the emergency department.  They will make every effort to see you within the next 24 hours

## 2016-07-18 DIAGNOSIS — R87612 Low grade squamous intraepithelial lesion on cytologic smear of cervix (LGSIL): Secondary | ICD-10-CM | POA: Diagnosis not present

## 2016-07-18 DIAGNOSIS — Z01411 Encounter for gynecological examination (general) (routine) with abnormal findings: Secondary | ICD-10-CM | POA: Diagnosis not present

## 2016-07-24 DIAGNOSIS — F3175 Bipolar disorder, in partial remission, most recent episode depressed: Secondary | ICD-10-CM | POA: Diagnosis not present

## 2016-07-30 DIAGNOSIS — Z30431 Encounter for routine checking of intrauterine contraceptive device: Secondary | ICD-10-CM | POA: Diagnosis not present

## 2016-07-30 DIAGNOSIS — F3175 Bipolar disorder, in partial remission, most recent episode depressed: Secondary | ICD-10-CM | POA: Diagnosis not present

## 2016-07-30 DIAGNOSIS — N898 Other specified noninflammatory disorders of vagina: Secondary | ICD-10-CM | POA: Diagnosis not present

## 2016-08-06 DIAGNOSIS — F3175 Bipolar disorder, in partial remission, most recent episode depressed: Secondary | ICD-10-CM | POA: Diagnosis not present

## 2016-08-19 DIAGNOSIS — K210 Gastro-esophageal reflux disease with esophagitis: Secondary | ICD-10-CM | POA: Diagnosis not present

## 2016-08-19 DIAGNOSIS — K2930 Chronic superficial gastritis without bleeding: Secondary | ICD-10-CM | POA: Diagnosis not present

## 2016-08-19 DIAGNOSIS — K3189 Other diseases of stomach and duodenum: Secondary | ICD-10-CM | POA: Diagnosis not present

## 2016-08-19 DIAGNOSIS — R1013 Epigastric pain: Secondary | ICD-10-CM | POA: Diagnosis not present

## 2016-08-19 DIAGNOSIS — K219 Gastro-esophageal reflux disease without esophagitis: Secondary | ICD-10-CM | POA: Diagnosis not present

## 2016-08-20 DIAGNOSIS — F3175 Bipolar disorder, in partial remission, most recent episode depressed: Secondary | ICD-10-CM | POA: Diagnosis not present

## 2016-08-22 DIAGNOSIS — K2930 Chronic superficial gastritis without bleeding: Secondary | ICD-10-CM | POA: Diagnosis not present

## 2016-08-22 DIAGNOSIS — K210 Gastro-esophageal reflux disease with esophagitis: Secondary | ICD-10-CM | POA: Diagnosis not present

## 2016-08-26 DIAGNOSIS — F3175 Bipolar disorder, in partial remission, most recent episode depressed: Secondary | ICD-10-CM | POA: Diagnosis not present

## 2016-09-04 DIAGNOSIS — F3175 Bipolar disorder, in partial remission, most recent episode depressed: Secondary | ICD-10-CM | POA: Diagnosis not present

## 2016-09-08 ENCOUNTER — Other Ambulatory Visit: Payer: Medicare Other

## 2016-09-15 ENCOUNTER — Ambulatory Visit (INDEPENDENT_AMBULATORY_CARE_PROVIDER_SITE_OTHER): Payer: Medicare Other | Admitting: Physician Assistant

## 2016-09-15 ENCOUNTER — Encounter: Payer: Self-pay | Admitting: Physician Assistant

## 2016-09-15 ENCOUNTER — Ambulatory Visit (INDEPENDENT_AMBULATORY_CARE_PROVIDER_SITE_OTHER): Payer: Medicare Other

## 2016-09-15 ENCOUNTER — Other Ambulatory Visit: Payer: Medicare Other

## 2016-09-15 VITALS — BP 111/77 | HR 66 | Temp 97.0°F | Resp 16 | Ht 65.0 in | Wt 187.0 lb

## 2016-09-15 DIAGNOSIS — M545 Low back pain, unspecified: Secondary | ICD-10-CM

## 2016-09-15 DIAGNOSIS — Z79899 Other long term (current) drug therapy: Secondary | ICD-10-CM | POA: Diagnosis not present

## 2016-09-15 DIAGNOSIS — B20 Human immunodeficiency virus [HIV] disease: Secondary | ICD-10-CM | POA: Diagnosis not present

## 2016-09-15 DIAGNOSIS — M160 Bilateral primary osteoarthritis of hip: Secondary | ICD-10-CM | POA: Diagnosis not present

## 2016-09-15 LAB — LIPID PANEL
CHOLESTEROL: 264 mg/dL — AB (ref <200)
HDL: 36 mg/dL — ABNORMAL LOW (ref >50)
LDL Cholesterol: 189 mg/dL — ABNORMAL HIGH (ref <100)
TRIGLYCERIDES: 196 mg/dL — AB (ref <150)
Total CHOL/HDL Ratio: 7.3 Ratio — ABNORMAL HIGH (ref <5.0)
VLDL: 39 mg/dL — AB (ref <30)

## 2016-09-15 LAB — COMPREHENSIVE METABOLIC PANEL
ALBUMIN: 3.3 g/dL — AB (ref 3.6–5.1)
ALK PHOS: 49 U/L (ref 33–115)
ALT: 7 U/L (ref 6–29)
AST: 12 U/L (ref 10–30)
BILIRUBIN TOTAL: 0.2 mg/dL (ref 0.2–1.2)
BUN: 8 mg/dL (ref 7–25)
CALCIUM: 8.8 mg/dL (ref 8.6–10.2)
CO2: 22 mmol/L (ref 20–32)
Chloride: 109 mmol/L (ref 98–110)
Creat: 1.11 mg/dL — ABNORMAL HIGH (ref 0.50–1.10)
GLUCOSE: 85 mg/dL (ref 65–99)
POTASSIUM: 4.2 mmol/L (ref 3.5–5.3)
Sodium: 138 mmol/L (ref 135–146)
Total Protein: 5.8 g/dL — ABNORMAL LOW (ref 6.1–8.1)

## 2016-09-15 LAB — CBC WITH DIFFERENTIAL/PLATELET
Basophils Absolute: 0 cells/uL (ref 0–200)
Basophils Relative: 0 %
EOS ABS: 171 {cells}/uL (ref 15–500)
Eosinophils Relative: 3 %
HEMATOCRIT: 36.5 % (ref 35.0–45.0)
HEMOGLOBIN: 12.2 g/dL (ref 11.7–15.5)
LYMPHS ABS: 2280 {cells}/uL (ref 850–3900)
LYMPHS PCT: 40 %
MCH: 33.1 pg — ABNORMAL HIGH (ref 27.0–33.0)
MCHC: 33.4 g/dL (ref 32.0–36.0)
MCV: 98.9 fL (ref 80.0–100.0)
MONO ABS: 513 {cells}/uL (ref 200–950)
MPV: 9.9 fL (ref 7.5–12.5)
Monocytes Relative: 9 %
NEUTROS PCT: 48 %
Neutro Abs: 2736 cells/uL (ref 1500–7800)
Platelets: 273 10*3/uL (ref 140–400)
RBC: 3.69 MIL/uL — ABNORMAL LOW (ref 3.80–5.10)
RDW: 15.1 % — AB (ref 11.0–15.0)
WBC: 5.7 10*3/uL (ref 3.8–10.8)

## 2016-09-15 MED ORDER — HYDROCODONE-ACETAMINOPHEN 5-325 MG PO TABS: 1.0000 | ORAL_TABLET | Freq: Four times a day (QID) | ORAL | 0 refills | Status: AC | PRN
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
## 2016-09-15 NOTE — Patient Instructions (Addendum)
You can ice the back three times per day for 15 minutes. I would like you to take the medication as prescribed.   You can do small stretches listed below, but ice directly after. Please use the methocarbamol as prescribed.  Note that it can cause sedation.  Do not operate heavy machinery.   Low Back Strain Rehab Ask your health care provider which exercises are safe for you. Do exercises exactly as told by your health care provider and adjust them as directed. It is normal to feel mild stretching, pulling, tightness, or discomfort as you do these exercises, but you should stop right away if you feel sudden pain or your pain gets worse. Do not begin these exercises until told by your health care provider. Stretching and range of motion exercises These exercises warm up your muscles and joints and improve the movement and flexibility of your back. These exercises also help to relieve pain, numbness, and tingling. Exercise A: Single knee to chest  1. Lie on your back on a firm surface with both legs straight. 2. Bend one of your knees. Use your hands to move your knee up toward your chest until you feel a gentle stretch in your lower back and buttock. ? Hold your leg in this position by holding onto the front of your knee. ? Keep your other leg as straight as possible. 3. Hold for __________ seconds. 4. Slowly return to the starting position. 5. Repeat with your other leg. Repeat __________ times. Complete this exercise __________ times a day. Exercise B: Prone extension on elbows  1. Lie on your abdomen on a firm surface. 2. Prop yourself up on your elbows. 3. Use your arms to help lift your chest up until you feel a gentle stretch in your abdomen and your lower back. ? This will place some of your body weight on your elbows. If this is uncomfortable, try stacking pillows under your chest. ? Your hips should stay down, against the surface that you are lying on. Keep your hip and back muscles  relaxed. 4. Hold for __________ seconds. 5. Slowly relax your upper body and return to the starting position. Repeat __________ times. Complete this exercise __________ times a day. Strengthening exercises These exercises build strength and endurance in your back. Endurance is the ability to use your muscles for a long time, even after they get tired. Exercise C: Pelvic tilt 1. Lie on your back on a firm surface. Bend your knees and keep your feet flat. 2. Tense your abdominal muscles. Tip your pelvis up toward the ceiling and flatten your lower back into the floor. ? To help with this exercise, you may place a small towel under your lower back and try to push your back into the towel. 3. Hold for __________ seconds. 4. Let your muscles relax completely before you repeat this exercise. Repeat __________ times. Complete this exercise __________ times a day. Exercise D: Alternating arm and leg raises  1. Get on your hands and knees on a firm surface. If you are on a hard floor, you may want to use padding to cushion your knees, such as an exercise mat. 2. Line up your arms and legs. Your hands should be below your shoulders, and your knees should be below your hips. 3. Lift your left leg behind you. At the same time, raise your right arm and straighten it in front of you. ? Do not lift your leg higher than your hip. ? Do not lift  your arm higher than your shoulder. ? Keep your abdominal and back muscles tight. ? Keep your hips facing the ground. ? Do not arch your back. ? Keep your balance carefully, and do not hold your breath. 4. Hold for __________ seconds. 5. Slowly return to the starting position and repeat with your right leg and your left arm. Repeat __________ times. Complete this exercise __________times a day. Exercise J: Single leg lower with bent knees 1. Lie on your back on a firm surface. 2. Tense your abdominal muscles and lift your feet off the floor, one foot at a time, so  your knees and hips are bent in an "L" shape (at about 90 degrees). ? Your knees should be over your hips and your lower legs should be parallel to the floor. 3. Keeping your abdominal muscles tense and your knee bent, slowly lower one of your legs so your toe touches the ground. 4. Lift your leg back up to return to the starting position. ? Do not hold your breath. ? Do not let your back arch. Keep your back flat against the ground. 5. Repeat with your other leg. Repeat __________ times. Complete this exercise __________ times a day. Posture and body mechanics  Body mechanics refers to the movements and positions of your body while you do your daily activities. Posture is part of body mechanics. Good posture and healthy body mechanics can help to relieve stress in your body's tissues and joints. Good posture means that your spine is in its natural S-curve position (your spine is neutral), your shoulders are pulled back slightly, and your head is not tipped forward. The following are general guidelines for applying improved posture and body mechanics to your everyday activities. Standing   When standing, keep your spine neutral and your feet about hip-width apart. Keep a slight bend in your knees. Your ears, shoulders, and hips should line up.  When you do a task in which you stand in one place for a long time, place one foot up on a stable object that is 2-4 inches (5-10 cm) high, such as a footstool. This helps keep your spine neutral. Sitting   When sitting, keep your spine neutral and keep your feet flat on the floor. Use a footrest, if necessary, and keep your thighs parallel to the floor. Avoid rounding your shoulders, and avoid tilting your head forward.  When working at a desk or a computer, keep your desk at a height where your hands are slightly lower than your elbows. Slide your chair under your desk so you are close enough to maintain good posture.  When working at a computer,  place your monitor at a height where you are looking straight ahead and you do not have to tilt your head forward or downward to look at the screen. Resting   When lying down and resting, avoid positions that are most painful for you.  If you have pain with activities such as sitting, bending, stooping, or squatting (flexion-based activities), lie in a position in which your body does not bend very much. For example, avoid curling up on your side with your arms and knees near your chest (fetal position).  If you have pain with activities such as standing for a long time or reaching with your arms (extension-based activities), lie with your spine in a neutral position and bend your knees slightly. Try the following positions: ? Lying on your side with a pillow between your knees. ? Lying on your  back with a pillow under your knees. Lifting   When lifting objects, keep your feet at least shoulder-width apart and tighten your abdominal muscles.  Bend your knees and hips and keep your spine neutral. It is important to lift using the strength of your legs, not your back. Do not lock your knees straight out.  Always ask for help to lift heavy or awkward objects. This information is not intended to replace advice given to you by your health care provider. Make sure you discuss any questions you have with your health care provider. Document Released: 01/27/2005 Document Revised: 10/04/2015 Document Reviewed: 11/08/2014 Elsevier Interactive Patient Education  2018 [employer].     IF you received an x-ray today, you will receive an invoice from [facility]. Please contact [facility] at [phone] with questions or concerns regarding your invoice.   IF you received labwork today, you will receive an invoice from [facility]. Please contact [facility] at [phone] with questions or concerns regarding your invoice.   Our billing staff will not be able to assist you with  questions regarding bills from these companies.  You will be contacted with the lab results as soon as they are available. The fastest way to get your results is to activate your My Chart account. Instructions are located on the last page of this paperwork. If you have not heard from [place] regarding the results in 2 weeks, please contact this office.
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
## 2016-09-16 LAB — T-HELPER CELL (CD4) - (RCID CLINIC ONLY)
CD4 % Helper T Cell: 48 % (ref 33–55)
CD4 T Cell Abs: 1120 /uL (ref 400–2700)

## 2016-09-17 DIAGNOSIS — F3175 Bipolar disorder, in partial remission, most recent episode depressed: Secondary | ICD-10-CM | POA: Diagnosis not present

## 2016-09-17 LAB — HIV-1 RNA QUANT-NO REFLEX-BLD
HIV 1 RNA Quant: <20 copies/mL — AB
HIV-1 RNA QUANT, LOG: DETECTED {Log_copies}/mL — AB

## 2016-09-18 ENCOUNTER — Emergency Department (HOSPITAL_COMMUNITY)
Admission: EM | Admit: 2016-09-18 | Discharge: 2016-09-18 | Disposition: A | Discharge status: Home / Self Care | Payer: Medicare Other | Attending: Emergency Medicine | Admitting: Emergency Medicine

## 2016-09-18 ENCOUNTER — Encounter (HOSPITAL_COMMUNITY): Payer: Self-pay | Admitting: Emergency Medicine

## 2016-09-18 DIAGNOSIS — Z79899 Other long term (current) drug therapy: Secondary | ICD-10-CM | POA: Diagnosis not present

## 2016-09-18 DIAGNOSIS — R21 Rash and other nonspecific skin eruption: Secondary | ICD-10-CM | POA: Diagnosis not present

## 2016-09-18 NOTE — ED Triage Notes (Signed)
Pt to ED with c/o burning, painful type rash under bil breast x's 3 or more days

## 2016-09-18 NOTE — Discharge Instructions (Signed)
Keep area clean and dry Follow up with your doctor if rash persists
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
## 2016-09-19 DIAGNOSIS — M25511 Pain in right shoulder: Secondary | ICD-10-CM | POA: Diagnosis not present

## 2016-09-22 ENCOUNTER — Ambulatory Visit (INDEPENDENT_AMBULATORY_CARE_PROVIDER_SITE_OTHER): Payer: Medicare Other | Admitting: Internal Medicine

## 2016-09-22 ENCOUNTER — Encounter: Payer: Self-pay | Admitting: Internal Medicine

## 2016-09-22 VITALS — Temp 98.5°F | Ht 61.0 in | Wt 185.0 lb

## 2016-09-22 DIAGNOSIS — Z5181 Encounter for therapeutic drug level monitoring: Secondary | ICD-10-CM

## 2016-09-22 DIAGNOSIS — B20 Human immunodeficiency virus [HIV] disease: Secondary | ICD-10-CM | POA: Diagnosis not present

## 2016-09-22 DIAGNOSIS — N182 Chronic kidney disease, stage 2 (mild): Secondary | ICD-10-CM

## 2016-09-22 MED ORDER — ELVITEG-COBIC-EMTRICIT-TENOFAF 150-150-200-10 MG PO TABS: 1.0000 | ORAL_TABLET | Freq: Every day | ORAL | 11 refills | Status: DC

## 2016-09-22 NOTE — Assessment & Plan Note (Signed)
Improved creat now.

## 2016-09-22 NOTE — Assessment & Plan Note (Signed)
Doing well with reassuring labs.  No changes and rtc 6 months.

## 2016-09-22 NOTE — Progress Notes (Signed)
  Subjective:    Patient ID: Yolanda Jones, female    DOB: [birth year], 36 y.o.   MRN: [id number]  HPI She comes in for followup of her HIV.  She continues on Genvoya.  She does see psychiatry and see her primary physician.  She did miss two doses in the last 6 months when she sleeps for 2-3 days due to depression.  She is getting an endocrine evaluation with recent nipple discharge thought to be a medication side effect.   No weight loss, no diarrhea.  Continues to be sexually active with a female partner who lives with her.  He is aware of her status, uses condoms always.       Review of Systems  Constitutional: Negative for fatigue.  HENT: Negative for sore throat and trouble swallowing.        Dry mouth  Gastrointestinal: Negative for diarrhea.  Skin: Negative for rash.  Neurological: Negative for dizziness and headaches.  Psychiatric/Behavioral: Negative for dysphoric mood. The patient is not hyperactive.        Objective:   Physical Exam  Constitutional: She appears well-developed and well-nourished.  HENT:  Mouth/Throat: No oropharyngeal exudate.  Eyes: Right eye exhibits no discharge. Left eye exhibits no discharge. No scleral icterus.  Cardiovascular: Normal rate, regular rhythm and normal heart sounds.   No murmur heard. Pulmonary/Chest: Effort normal and breath sounds normal. No respiratory distress.  Lymphadenopathy:    She has no cervical adenopathy.  Skin: No rash noted.    SH: no alcohol     Assessment & Plan:

## 2016-09-22 NOTE — Assessment & Plan Note (Signed)
Normal lfts

## 2016-09-24 DIAGNOSIS — F3175 Bipolar disorder, in partial remission, most recent episode depressed: Secondary | ICD-10-CM | POA: Diagnosis not present

## 2016-09-26 DIAGNOSIS — E221 Hyperprolactinemia: Secondary | ICD-10-CM | POA: Diagnosis not present

## 2016-10-01 DIAGNOSIS — F3175 Bipolar disorder, in partial remission, most recent episode depressed: Secondary | ICD-10-CM | POA: Diagnosis not present

## 2016-10-08 DIAGNOSIS — F3175 Bipolar disorder, in partial remission, most recent episode depressed: Secondary | ICD-10-CM | POA: Diagnosis not present

## 2016-10-15 ENCOUNTER — Other Ambulatory Visit: Payer: Self-pay | Admitting: Internal Medicine

## 2016-10-15 DIAGNOSIS — B20 Human immunodeficiency virus [HIV] disease: Secondary | ICD-10-CM

## 2016-11-12 DIAGNOSIS — F3175 Bipolar disorder, in partial remission, most recent episode depressed: Secondary | ICD-10-CM | POA: Diagnosis not present

## 2016-11-19 DIAGNOSIS — F3175 Bipolar disorder, in partial remission, most recent episode depressed: Secondary | ICD-10-CM | POA: Diagnosis not present

## 2016-11-26 DIAGNOSIS — F3175 Bipolar disorder, in partial remission, most recent episode depressed: Secondary | ICD-10-CM | POA: Diagnosis not present

## 2016-12-03 DIAGNOSIS — F3175 Bipolar disorder, in partial remission, most recent episode depressed: Secondary | ICD-10-CM | POA: Diagnosis not present

## 2016-12-10 DIAGNOSIS — F3175 Bipolar disorder, in partial remission, most recent episode depressed: Secondary | ICD-10-CM | POA: Diagnosis not present

## 2016-12-23 DIAGNOSIS — F3175 Bipolar disorder, in partial remission, most recent episode depressed: Secondary | ICD-10-CM | POA: Diagnosis not present

## 2016-12-24 DIAGNOSIS — Z01411 Encounter for gynecological examination (general) (routine) with abnormal findings: Secondary | ICD-10-CM | POA: Diagnosis not present

## 2016-12-24 DIAGNOSIS — Z862 Personal history of diseases of the blood and blood-forming organs and certain disorders involving the immune mechanism: Secondary | ICD-10-CM | POA: Diagnosis not present

## 2016-12-24 DIAGNOSIS — N912 Amenorrhea, unspecified: Secondary | ICD-10-CM | POA: Diagnosis not present

## 2016-12-24 DIAGNOSIS — Z30431 Encounter for routine checking of intrauterine contraceptive device: Secondary | ICD-10-CM | POA: Diagnosis not present

## 2016-12-24 DIAGNOSIS — E559 Vitamin D deficiency, unspecified: Secondary | ICD-10-CM | POA: Diagnosis not present

## 2016-12-24 DIAGNOSIS — R87610 Atypical squamous cells of undetermined significance on cytologic smear of cervix (ASC-US): Secondary | ICD-10-CM | POA: Diagnosis not present

## 2016-12-24 DIAGNOSIS — Z6834 Body mass index (BMI) 34.0-34.9, adult: Secondary | ICD-10-CM | POA: Diagnosis not present

## 2016-12-31 DIAGNOSIS — F3175 Bipolar disorder, in partial remission, most recent episode depressed: Secondary | ICD-10-CM | POA: Diagnosis not present

## 2016-12-31 DIAGNOSIS — Z23 Encounter for immunization: Secondary | ICD-10-CM | POA: Diagnosis not present

## 2017-01-05 ENCOUNTER — Other Ambulatory Visit: Payer: Self-pay | Admitting: Internal Medicine

## 2017-01-07 DIAGNOSIS — F3175 Bipolar disorder, in partial remission, most recent episode depressed: Secondary | ICD-10-CM | POA: Diagnosis not present

## 2017-01-08 ENCOUNTER — Ambulatory Visit (INDEPENDENT_AMBULATORY_CARE_PROVIDER_SITE_OTHER): Payer: Self-pay

## 2017-01-08 ENCOUNTER — Encounter (INDEPENDENT_AMBULATORY_CARE_PROVIDER_SITE_OTHER): Payer: Self-pay | Admitting: Orthopedic Surgery

## 2017-01-08 ENCOUNTER — Ambulatory Visit (INDEPENDENT_AMBULATORY_CARE_PROVIDER_SITE_OTHER): Payer: Medicare Other

## 2017-01-08 ENCOUNTER — Ambulatory Visit (INDEPENDENT_AMBULATORY_CARE_PROVIDER_SITE_OTHER): Payer: Medicare Other | Admitting: Orthopedic Surgery

## 2017-01-08 DIAGNOSIS — M5010 Cervical disc disorder with radiculopathy, unspecified cervical region: Secondary | ICD-10-CM | POA: Diagnosis not present

## 2017-01-08 DIAGNOSIS — G8929 Other chronic pain: Secondary | ICD-10-CM

## 2017-01-08 DIAGNOSIS — F900 Attention-deficit hyperactivity disorder, predominantly inattentive type: Secondary | ICD-10-CM | POA: Diagnosis not present

## 2017-01-08 DIAGNOSIS — G3184 Mild cognitive impairment, so stated: Secondary | ICD-10-CM | POA: Diagnosis not present

## 2017-01-08 DIAGNOSIS — M542 Cervicalgia: Secondary | ICD-10-CM

## 2017-01-08 DIAGNOSIS — F3175 Bipolar disorder, in partial remission, most recent episode depressed: Secondary | ICD-10-CM | POA: Diagnosis not present

## 2017-01-08 DIAGNOSIS — M25511 Pain in right shoulder: Secondary | ICD-10-CM | POA: Diagnosis not present

## 2017-01-08 DIAGNOSIS — F4321 Adjustment disorder with depressed mood: Secondary | ICD-10-CM | POA: Diagnosis not present

## 2017-01-08 MED ORDER — PREDNISONE 10 MG PO TABS: 20.0000 mg | ORAL_TABLET | Freq: Every day | ORAL | 0 refills | Status: DC
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
## 2017-01-09 ENCOUNTER — Encounter: Payer: Self-pay | Admitting: Orthopedic Surgery

## 2017-01-09 DIAGNOSIS — Z79899 Other long term (current) drug therapy: Secondary | ICD-10-CM | POA: Diagnosis not present

## 2017-01-09 DIAGNOSIS — M47812 Spondylosis without myelopathy or radiculopathy, cervical region: Secondary | ICD-10-CM | POA: Diagnosis not present

## 2017-01-09 DIAGNOSIS — M50221 Other cervical disc displacement at C4-C5 level: Secondary | ICD-10-CM | POA: Diagnosis not present
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
## 2017-01-12 ENCOUNTER — Encounter (INDEPENDENT_AMBULATORY_CARE_PROVIDER_SITE_OTHER): Payer: Self-pay | Admitting: Orthopedic Surgery

## 2017-01-12 ENCOUNTER — Ambulatory Visit (INDEPENDENT_AMBULATORY_CARE_PROVIDER_SITE_OTHER): Payer: Medicare Other | Admitting: Orthopedic Surgery

## 2017-01-12 VITALS — Ht 61.0 in | Wt 185.0 lb

## 2017-01-12 DIAGNOSIS — M4302 Spondylolysis, cervical region: Secondary | ICD-10-CM

## 2017-01-12 DIAGNOSIS — M5010 Cervical disc disorder with radiculopathy, unspecified cervical region: Secondary | ICD-10-CM | POA: Diagnosis not present

## 2017-01-12 MED ORDER — GABAPENTIN 300 MG PO CAPS: 300.0000 mg | ORAL_CAPSULE | Freq: Three times a day (TID) | ORAL | 3 refills | Status: DC
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
## 2017-01-13 ENCOUNTER — Ambulatory Visit (INDEPENDENT_AMBULATORY_CARE_PROVIDER_SITE_OTHER): Payer: Medicare Other | Admitting: Orthopedic Surgery

## 2017-01-14 ENCOUNTER — Other Ambulatory Visit (INDEPENDENT_AMBULATORY_CARE_PROVIDER_SITE_OTHER): Payer: Self-pay

## 2017-01-14 DIAGNOSIS — M542 Cervicalgia: Secondary | ICD-10-CM

## 2017-01-15 DIAGNOSIS — F3175 Bipolar disorder, in partial remission, most recent episode depressed: Secondary | ICD-10-CM | POA: Diagnosis not present

## 2017-01-26 NOTE — Progress Notes (Signed)
Pt has been sch with FN but she wants to meet with Dr. Sharol Given first prior to that appt.

## 2017-01-28 DIAGNOSIS — F3175 Bipolar disorder, in partial remission, most recent episode depressed: Secondary | ICD-10-CM | POA: Diagnosis not present

## 2017-02-05 DIAGNOSIS — F3175 Bipolar disorder, in partial remission, most recent episode depressed: Secondary | ICD-10-CM | POA: Diagnosis not present

## 2017-02-11 ENCOUNTER — Ambulatory Visit (INDEPENDENT_AMBULATORY_CARE_PROVIDER_SITE_OTHER): Payer: Medicare Other | Admitting: Orthopedic Surgery

## 2017-02-16 ENCOUNTER — Ambulatory Visit (INDEPENDENT_AMBULATORY_CARE_PROVIDER_SITE_OTHER): Payer: Medicare Other | Admitting: Orthopedic Surgery

## 2017-02-16 ENCOUNTER — Encounter (INDEPENDENT_AMBULATORY_CARE_PROVIDER_SITE_OTHER): Payer: Self-pay | Admitting: Orthopedic Surgery

## 2017-02-16 VITALS — Ht 61.0 in | Wt 185.0 lb

## 2017-02-16 DIAGNOSIS — M5010 Cervical disc disorder with radiculopathy, unspecified cervical region: Secondary | ICD-10-CM
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
## 2017-02-18 ENCOUNTER — Encounter (INDEPENDENT_AMBULATORY_CARE_PROVIDER_SITE_OTHER): Payer: Self-pay | Admitting: Physical Medicine and Rehabilitation

## 2017-02-18 ENCOUNTER — Ambulatory Visit (INDEPENDENT_AMBULATORY_CARE_PROVIDER_SITE_OTHER): Payer: Medicare Other | Admitting: Physical Medicine and Rehabilitation

## 2017-02-18 VITALS — BP 104/48

## 2017-02-18 DIAGNOSIS — M5010 Cervical disc disorder with radiculopathy, unspecified cervical region: Secondary | ICD-10-CM | POA: Diagnosis not present

## 2017-02-18 DIAGNOSIS — F3132 Bipolar disorder, current episode depressed, moderate: Secondary | ICD-10-CM | POA: Diagnosis not present

## 2017-02-18 DIAGNOSIS — M5412 Radiculopathy, cervical region: Secondary | ICD-10-CM | POA: Diagnosis not present

## 2017-02-18 DIAGNOSIS — B20 Human immunodeficiency virus [HIV] disease: Secondary | ICD-10-CM | POA: Diagnosis not present

## 2017-02-18 DIAGNOSIS — F411 Generalized anxiety disorder: Secondary | ICD-10-CM

## 2017-02-18 DIAGNOSIS — M4802 Spinal stenosis, cervical region: Secondary | ICD-10-CM | POA: Diagnosis not present
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
## 2017-02-18 NOTE — Progress Notes (Deleted)
Pt states pain in her neck that radiates down her right should and right arm. Pt states a burning tingling feeling in right hand. Pt states the way she sits or move makes the pain worse, pt states previous shot made the pain better.

## 2017-02-20 DIAGNOSIS — F3175 Bipolar disorder, in partial remission, most recent episode depressed: Secondary | ICD-10-CM | POA: Diagnosis not present

## 2017-02-27 DIAGNOSIS — E559 Vitamin D deficiency, unspecified: Secondary | ICD-10-CM | POA: Diagnosis not present

## 2017-02-27 DIAGNOSIS — F3175 Bipolar disorder, in partial remission, most recent episode depressed: Secondary | ICD-10-CM | POA: Diagnosis not present

## 2017-03-02 ENCOUNTER — Encounter (INDEPENDENT_AMBULATORY_CARE_PROVIDER_SITE_OTHER): Payer: Medicare Other | Admitting: Physical Medicine and Rehabilitation

## 2017-03-03 ENCOUNTER — Encounter (HOSPITAL_COMMUNITY): Payer: Self-pay

## 2017-03-03 ENCOUNTER — Other Ambulatory Visit: Payer: Self-pay

## 2017-03-03 ENCOUNTER — Telehealth (INDEPENDENT_AMBULATORY_CARE_PROVIDER_SITE_OTHER): Payer: Self-pay | Admitting: Physical Medicine and Rehabilitation

## 2017-03-03 ENCOUNTER — Emergency Department (HOSPITAL_COMMUNITY)
Admission: EM | Admit: 2017-03-03 | Discharge: 2017-03-03 | Disposition: A | Discharge status: Home / Self Care | Payer: Medicare Other | Attending: Emergency Medicine | Admitting: Emergency Medicine

## 2017-03-03 ENCOUNTER — Emergency Department (HOSPITAL_COMMUNITY): Payer: Medicare Other

## 2017-03-03 DIAGNOSIS — J069 Acute upper respiratory infection, unspecified: Secondary | ICD-10-CM | POA: Diagnosis not present

## 2017-03-03 DIAGNOSIS — Z79899 Other long term (current) drug therapy: Secondary | ICD-10-CM | POA: Insufficient documentation

## 2017-03-03 DIAGNOSIS — B20 Human immunodeficiency virus [HIV] disease: Secondary | ICD-10-CM | POA: Diagnosis not present

## 2017-03-03 DIAGNOSIS — R0602 Shortness of breath: Secondary | ICD-10-CM | POA: Diagnosis not present

## 2017-03-03 DIAGNOSIS — J4521 Mild intermittent asthma with (acute) exacerbation: Secondary | ICD-10-CM | POA: Insufficient documentation

## 2017-03-03 DIAGNOSIS — N182 Chronic kidney disease, stage 2 (mild): Secondary | ICD-10-CM | POA: Diagnosis not present

## 2017-03-03 DIAGNOSIS — J45901 Unspecified asthma with (acute) exacerbation: Secondary | ICD-10-CM | POA: Diagnosis not present

## 2017-03-03 DIAGNOSIS — R0789 Other chest pain: Secondary | ICD-10-CM | POA: Diagnosis not present

## 2017-03-03 DIAGNOSIS — R5383 Other fatigue: Secondary | ICD-10-CM | POA: Diagnosis not present

## 2017-03-03 LAB — POC URINE PREG, ED: Preg Test, Ur: NEGATIVE

## 2017-03-03 MED ORDER — ALBUTEROL SULFATE HFA 108 (90 BASE) MCG/ACT IN AERS
1.0000 | INHALATION_SPRAY | Freq: Once | RESPIRATORY_TRACT | Status: AC
  Administered 2017-03-03: 1 via RESPIRATORY_TRACT
  Filled 2017-03-03: qty 6.7

## 2017-03-03 MED ORDER — IPRATROPIUM-ALBUTEROL 0.5-2.5 (3) MG/3ML IN SOLN
3.0000 mL | Freq: Once | RESPIRATORY_TRACT | Status: AC
  Administered 2017-03-03: 3 mL via RESPIRATORY_TRACT
  Filled 2017-03-03: qty 3
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
## 2017-03-03 NOTE — ED Triage Notes (Signed)
Pt presents with onset of shortness of breath that began today.  Pt lives on 8th floor with no elevator, reports h/o asthma with dry cough.  Pt is out of her inhaler; reports cough is dry, denies any nasal congestion.

## 2017-03-03 NOTE — Discharge Instructions (Signed)
Please take the inhaler as prescribed. Take 2 puffs every 6-8 hours as needed for shortness of breath and/or wheezing. Follow up with your primary doctor within the next 7-10 days to check resolution of your symptoms. Please return to the ER sooner if you have any new or worsening symptoms including any shortness of breath or chest pain.

## 2017-03-03 NOTE — ED Notes (Signed)
Patient transported to X-ray 

## 2017-03-03 NOTE — Telephone Encounter (Signed)
Patients appt needs to be rescheduled that she missed yesterday. CB # [phone]

## 2017-03-03 NOTE — ED Notes (Signed)
Declined W/C at D/C and was escorted to lobby by RN. 

## 2017-03-04 ENCOUNTER — Telehealth (INDEPENDENT_AMBULATORY_CARE_PROVIDER_SITE_OTHER): Payer: Self-pay | Admitting: Radiology

## 2017-03-04 ENCOUNTER — Ambulatory Visit (INDEPENDENT_AMBULATORY_CARE_PROVIDER_SITE_OTHER): Payer: Medicare Other

## 2017-03-04 ENCOUNTER — Encounter (INDEPENDENT_AMBULATORY_CARE_PROVIDER_SITE_OTHER): Payer: Self-pay | Admitting: Family

## 2017-03-04 ENCOUNTER — Ambulatory Visit (INDEPENDENT_AMBULATORY_CARE_PROVIDER_SITE_OTHER): Payer: Medicare Other | Admitting: Family

## 2017-03-04 VITALS — Ht 61.0 in | Wt 185.0 lb

## 2017-03-04 DIAGNOSIS — M25561 Pain in right knee: Secondary | ICD-10-CM

## 2017-03-04 DIAGNOSIS — M25562 Pain in left knee: Secondary | ICD-10-CM

## 2017-03-04 NOTE — Telephone Encounter (Signed)
Rescheduled

## 2017-03-04 NOTE — Telephone Encounter (Signed)
Patient calling triage line. She is very anxious, she states she was suppose to have ESI with Dr. Ernestina Patches. But was sick and unable to come, she is being reschedule. She is wanting her knee xrayed today, states she keeps falling, fearful she will lead to further injury. She feels like her knee is subluxing. Wants an appointment today. Made her appointment today at 200pm with Erin.
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
## 2017-03-06 ENCOUNTER — Other Ambulatory Visit: Payer: Self-pay

## 2017-03-06 ENCOUNTER — Encounter: Payer: Self-pay | Admitting: Physical Therapy

## 2017-03-06 ENCOUNTER — Ambulatory Visit: Payer: Medicare Other | Attending: Family | Admitting: Physical Therapy

## 2017-03-06 DIAGNOSIS — R262 Difficulty in walking, not elsewhere classified: Secondary | ICD-10-CM | POA: Insufficient documentation

## 2017-03-06 DIAGNOSIS — G8929 Other chronic pain: Secondary | ICD-10-CM | POA: Diagnosis not present

## 2017-03-06 DIAGNOSIS — M25561 Pain in right knee: Secondary | ICD-10-CM | POA: Diagnosis not present

## 2017-03-06 DIAGNOSIS — M25571 Pain in right ankle and joints of right foot: Secondary | ICD-10-CM | POA: Diagnosis not present

## 2017-03-06 DIAGNOSIS — R296 Repeated falls: Secondary | ICD-10-CM | POA: Insufficient documentation

## 2017-03-06 DIAGNOSIS — M6281 Muscle weakness (generalized): Secondary | ICD-10-CM | POA: Diagnosis not present

## 2017-03-06 DIAGNOSIS — F3175 Bipolar disorder, in partial remission, most recent episode depressed: Secondary | ICD-10-CM | POA: Diagnosis not present
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
## 2017-03-11 ENCOUNTER — Other Ambulatory Visit: Payer: Self-pay | Admitting: Behavioral Health

## 2017-03-11 ENCOUNTER — Ambulatory Visit (INDEPENDENT_AMBULATORY_CARE_PROVIDER_SITE_OTHER): Payer: Medicare Other | Admitting: Orthopedic Surgery

## 2017-03-11 ENCOUNTER — Other Ambulatory Visit: Payer: Medicare Other

## 2017-03-11 DIAGNOSIS — B20 Human immunodeficiency virus [HIV] disease: Secondary | ICD-10-CM

## 2017-03-12 LAB — T-HELPER CELL (CD4) - (RCID CLINIC ONLY)
CD4 % Helper T Cell: 48 % (ref 33–55)
CD4 T Cell Abs: 1020 /uL (ref 400–2700)

## 2017-03-13 LAB — HIV-1 RNA QUANT-NO REFLEX-BLD
HIV 1 RNA Quant: <20 copies/mL
HIV-1 RNA QUANT, LOG: NOT DETECTED {Log_copies}/mL

## 2017-03-17 ENCOUNTER — Ambulatory Visit: Payer: Medicare Other | Admitting: Physical Therapy

## 2017-03-18 ENCOUNTER — Ambulatory Visit (INDEPENDENT_AMBULATORY_CARE_PROVIDER_SITE_OTHER): Payer: Medicare Other | Admitting: Physical Medicine and Rehabilitation

## 2017-03-18 ENCOUNTER — Encounter (INDEPENDENT_AMBULATORY_CARE_PROVIDER_SITE_OTHER): Payer: Self-pay | Admitting: Physical Medicine and Rehabilitation

## 2017-03-18 ENCOUNTER — Ambulatory Visit (INDEPENDENT_AMBULATORY_CARE_PROVIDER_SITE_OTHER): Payer: Medicare Other

## 2017-03-18 VITALS — BP 105/76 | HR 116 | Temp 98.3°F

## 2017-03-18 DIAGNOSIS — M5010 Cervical disc disorder with radiculopathy, unspecified cervical region: Secondary | ICD-10-CM | POA: Diagnosis not present

## 2017-03-18 DIAGNOSIS — M5412 Radiculopathy, cervical region: Secondary | ICD-10-CM

## 2017-03-18 DIAGNOSIS — M4802 Spinal stenosis, cervical region: Secondary | ICD-10-CM | POA: Diagnosis not present

## 2017-03-18 MED ORDER — METHYLPREDNISOLONE ACETATE 80 MG/ML IJ SUSP
80.0000 mg | Freq: Once | INTRAMUSCULAR | Status: AC
  Administered 2017-03-18: 80 mg

## 2017-03-18 NOTE — Progress Notes (Deleted)
Pt states aches in neck and both shoulders. Pt states aches has been going on for about 1 year. Pt states how she sits,and picking up heavy items makes pain worse, nothing makes the pain better. +Driver, -BT,-Dye Allergies.

## 2017-03-18 NOTE — Patient Instructions (Signed)

## 2017-03-19 ENCOUNTER — Ambulatory Visit: Payer: Medicare Other | Admitting: Physical Therapy

## 2017-03-23 ENCOUNTER — Encounter: Payer: Self-pay | Admitting: Internal Medicine

## 2017-03-23 ENCOUNTER — Ambulatory Visit: Payer: Medicare Other | Attending: Family | Admitting: Physical Therapy

## 2017-03-23 ENCOUNTER — Ambulatory Visit (INDEPENDENT_AMBULATORY_CARE_PROVIDER_SITE_OTHER): Payer: Medicare Other | Admitting: Internal Medicine

## 2017-03-23 ENCOUNTER — Encounter: Payer: Self-pay | Admitting: Physical Therapy

## 2017-03-23 VITALS — BP 128/72 | HR 121 | Temp 98.3°F | Wt 172.0 lb

## 2017-03-23 DIAGNOSIS — B20 Human immunodeficiency virus [HIV] disease: Secondary | ICD-10-CM | POA: Diagnosis not present

## 2017-03-23 DIAGNOSIS — M25561 Pain in right knee: Secondary | ICD-10-CM | POA: Diagnosis not present

## 2017-03-23 DIAGNOSIS — M6281 Muscle weakness (generalized): Secondary | ICD-10-CM

## 2017-03-23 DIAGNOSIS — Z113 Encounter for screening for infections with a predominantly sexual mode of transmission: Secondary | ICD-10-CM

## 2017-03-23 DIAGNOSIS — R296 Repeated falls: Secondary | ICD-10-CM | POA: Diagnosis not present

## 2017-03-23 DIAGNOSIS — B009 Herpesviral infection, unspecified: Secondary | ICD-10-CM | POA: Diagnosis not present

## 2017-03-23 DIAGNOSIS — M25571 Pain in right ankle and joints of right foot: Secondary | ICD-10-CM | POA: Insufficient documentation

## 2017-03-23 DIAGNOSIS — R262 Difficulty in walking, not elsewhere classified: Secondary | ICD-10-CM | POA: Diagnosis not present

## 2017-03-23 DIAGNOSIS — G8929 Other chronic pain: Secondary | ICD-10-CM | POA: Insufficient documentation

## 2017-03-23 DIAGNOSIS — Z79899 Other long term (current) drug therapy: Secondary | ICD-10-CM | POA: Diagnosis present

## 2017-03-23 MED ORDER — BICTEGRAVIR-EMTRICITAB-TENOFOV 50-200-25 MG PO TABS: 1.0000 | ORAL_TABLET | Freq: Every day | ORAL | 11 refills | Status: DC
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
## 2017-03-24 ENCOUNTER — Encounter (INDEPENDENT_AMBULATORY_CARE_PROVIDER_SITE_OTHER): Payer: Self-pay | Admitting: Physical Medicine and Rehabilitation

## 2017-03-24 ENCOUNTER — Encounter: Payer: Self-pay | Admitting: Internal Medicine

## 2017-03-24 MED ORDER — METHYLPREDNISOLONE ACETATE 80 MG/ML IJ SUSP: 80.0000 mg | Freq: Once | INTRAMUSCULAR | Status: DC

## 2017-03-24 NOTE — Assessment & Plan Note (Signed)
Continues on Valtrex suppression. No recent outbreaks.

## 2017-03-24 NOTE — Assessment & Plan Note (Addendum)
Doing well with really just one missed dose.  No issues.  With her other medications, I will change her to [facility] to take out the booster.  rtc 6 months.

## 2017-03-24 NOTE — Procedures (Signed)
Yolanda Jones is a 37 year old right-hand-dominant C7-T1 interlaminar epidural steroid injection.  Please see our prior evaluation and management note for further details and justification.  Cervical Epidural Steroid Injection - Interlaminar Approach with Fluoroscopic Guidance  Patient: Yolanda Jones      Date of Birth: Apr 13, 1980 MRN: [id number] PCP: Berkley Harvey, NP      Visit Date: 03/18/2017   Universal Protocol:    Date/Time: 02/12/195:53 AM  Consent Given By: the patient  Position: PRONE  Additional Comments: Vital signs were monitored before and after the procedure. Patient was prepped and draped in the usual sterile fashion. The correct patient, procedure, and site was verified.   Injection Procedure Details:  Procedure Site One Meds Administered:  Meds ordered this encounter  Medications  . methylPREDNISolone acetate (DEPO-MEDROL) injection 80 mg  . methylPREDNISolone acetate (DEPO-MEDROL) injection 80 mg     Laterality: Right  Location/Site:  C7-T1  Needle size: 20 G  Needle type: Touhy  Needle Placement: Paramedian epidural space  Findings:  -Comments: Excellent flow of contrast into the epidural space.  Procedure Details: Using a paramedian approach from the side mentioned above, the region overlying the inferior lamina was localized under fluoroscopic visualization and the soft tissues overlying this structure were infiltrated with 4 ml. of 1% Lidocaine without Epinephrine. A # 20 gauge, Tuohy needle was inserted into the epidural space using a paramedian approach.  The epidural space was localized using loss of resistance along with lateral and contralateral oblique bi-planar fluoroscopic views.  After negative aspirate for air, blood, and CSF, a 2 ml. volume of Isovue-250 was injected into the epidural space and the flow of contrast was observed. Radiographs were obtained for documentation purposes.   The injectate was administered into the level  noted above.  Additional Comments:  The patient tolerated the procedure well Dressing: Band-Aid    Post-procedure details: Patient was observed during the procedure. Post-procedure instructions were reviewed.  Patient left the clinic in stable condition.   Pertinent Imaging: Acute Interface, Incoming Rad Results - 01/12/2017 11:30 AM EST TECHNIQUE: Multiplanar, multisequence MRI obtained through the cervical spine without contrast. Some of the images are substantially degraded by motion artifact.  COMPARISON: None.  INDICATION: Cervicalgia. Increasing right arm pain and weakness.  FINDINGS:  Osseous structures: Marrow signal is normal. No fracture or vertebral body height loss. No aggressive bone lesions or edema. Alignment: No focal subluxation. Spinal cord: Normal size and signal. Paraspinous soft tissues: Unremarkable.  C2-C3: No significant stenosis. C3-C4: No significant stenosis. C4-C5: Minimal disc degenerative change with trace posterior bulge and uncovertebral spurring on the right greater than left. Minimal right foraminal stenosis.Marland Kitchen C5-C6: Disc degenerative change with trace posterior bulge. Uncovertebral spurring on the right greater than left and mild facet arthrosis also worse on the right. The central canal is patent. There is moderate to severe right and mild to moderate left  foraminal stenosis. The canal is patent. C6-C7: Trace posterior disc bulge. The canal and foramina are patent. C7-T1: No significant stenosis.   IMPRESSION: 1.  Degenerative changes greatest at C5-6 with prominent uncovertebral spurs and facet arthrosis on the right greater than left. There is moderate to severe right and mild to moderate left foraminal stenosis. 2.  Minimal right foraminal stenosis at C4-5.

## 2017-03-24 NOTE — Progress Notes (Signed)
  Subjective:    Patient ID: Yolanda Jones, female    DOB: [birth year], 37 y.o.   MRN: [id number]  HPI She comes in for followup of her HIV.  She continues on Genvoya.  She does see psychiatry and see her primary physician.  She did miss one dose in the last 6 months when she sleeps excessively due to depression. No weight loss, no diarrhea.  Continues to be sexually active with a female partner who lives with her.  He is aware of her status, uses condoms always.    CD4 1020, viral load < 20.     Review of Systems  Constitutional: Negative for fatigue.  HENT: Negative for sore throat and trouble swallowing.        Dry mouth  Gastrointestinal: Negative for diarrhea.  Skin: Negative for rash.  Neurological: Negative for dizziness and headaches.  Psychiatric/Behavioral: Negative for dysphoric mood. The patient is not hyperactive.        Objective:   Physical Exam  Constitutional: She appears well-developed and well-nourished.  HENT:  Mouth/Throat: No oropharyngeal exudate.  Eyes: Right eye exhibits no discharge. Left eye exhibits no discharge. No scleral icterus.  Cardiovascular: Normal rate, regular rhythm and normal heart sounds.  No murmur heard. Pulmonary/Chest: Effort normal and breath sounds normal. No respiratory distress.  Lymphadenopathy:    She has no cervical adenopathy.  Skin: No rash noted.       Assessment & Plan:

## 2017-03-25 ENCOUNTER — Ambulatory Visit: Payer: Medicare Other | Admitting: Internal Medicine

## 2017-03-25 DIAGNOSIS — F3175 Bipolar disorder, in partial remission, most recent episode depressed: Secondary | ICD-10-CM | POA: Diagnosis not present

## 2017-03-27 ENCOUNTER — Ambulatory Visit (HOSPITAL_COMMUNITY)
Admission: EM | Admit: 2017-03-27 | Discharge: 2017-03-27 | Disposition: A | Discharge status: Home / Self Care | Payer: Medicare Other | Attending: Family Medicine | Admitting: Family Medicine

## 2017-03-27 ENCOUNTER — Encounter (HOSPITAL_COMMUNITY): Payer: Self-pay | Admitting: Family Medicine

## 2017-03-27 ENCOUNTER — Encounter: Payer: Medicare Other | Admitting: Physical Therapy

## 2017-03-27 ENCOUNTER — Ambulatory Visit: Payer: Medicare Other | Admitting: Physical Therapy

## 2017-03-27 DIAGNOSIS — K5901 Slow transit constipation: Secondary | ICD-10-CM

## 2017-03-27 NOTE — Discharge Instructions (Signed)
The medicines were taking have side effects of extreme constipation.  Need to take the MiraLAX 3 times a day on a regular basis. In addition, for this weekend, tried to stick to liquid diet such as milkshakes, eggnog, or Ensure.
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
## 2017-03-27 NOTE — ED Triage Notes (Addendum)
Pt here for about 2 weeks of constipation. Taking miralax without BM. sts some nausea and denies rectal bleeding. sts that every time she eats her abd swells and is very sensitive to touch.

## 2017-03-31 ENCOUNTER — Telehealth: Payer: Self-pay | Admitting: Physical Therapy

## 2017-03-31 ENCOUNTER — Ambulatory Visit: Payer: Medicare Other | Admitting: Physical Therapy

## 2017-03-31 NOTE — Telephone Encounter (Signed)
Left message regarding missed visit today. Advised that last Friday appointment was also a no show, per policy if NS for next appointment will be able to schedule one appointment at a time. Advised of next appointment and encouraged to contact us with questions. Juwuan Sedita C. Briel Gallicchio PT, DPT 03/31/17 12:20 PM

## 2017-04-01 DIAGNOSIS — F3175 Bipolar disorder, in partial remission, most recent episode depressed: Secondary | ICD-10-CM | POA: Diagnosis not present

## 2017-04-02 ENCOUNTER — Ambulatory Visit: Payer: Medicare Other | Admitting: Physical Therapy

## 2017-04-06 ENCOUNTER — Encounter: Payer: Medicare Other | Admitting: Physical Therapy

## 2017-04-07 DIAGNOSIS — F3175 Bipolar disorder, in partial remission, most recent episode depressed: Secondary | ICD-10-CM | POA: Diagnosis not present

## 2017-04-08 DIAGNOSIS — F900 Attention-deficit hyperactivity disorder, predominantly inattentive type: Secondary | ICD-10-CM | POA: Diagnosis not present

## 2017-04-08 DIAGNOSIS — F310 Bipolar disorder, current episode hypomanic: Secondary | ICD-10-CM | POA: Diagnosis not present

## 2017-04-09 ENCOUNTER — Encounter: Payer: Medicare Other | Admitting: Physical Therapy

## 2017-04-10 ENCOUNTER — Other Ambulatory Visit: Payer: Self-pay

## 2017-04-10 ENCOUNTER — Encounter (HOSPITAL_COMMUNITY): Payer: Self-pay | Admitting: Emergency Medicine

## 2017-04-10 ENCOUNTER — Ambulatory Visit (HOSPITAL_COMMUNITY)
Admission: EM | Admit: 2017-04-10 | Discharge: 2017-04-10 | Disposition: A | Discharge status: Home / Self Care | Payer: Medicare Other | Attending: Internal Medicine | Admitting: Internal Medicine

## 2017-04-10 DIAGNOSIS — K0889 Other specified disorders of teeth and supporting structures: Secondary | ICD-10-CM

## 2017-04-10 MED ORDER — PENICILLIN V POTASSIUM 500 MG PO TABS: 500.0000 mg | ORAL_TABLET | Freq: Three times a day (TID) | ORAL | 0 refills | Status: AC

## 2017-04-10 NOTE — Discharge Instructions (Addendum)
Prescription for penicillin sent to the pharmacy.  Keep follow-up with dentist as planned for definitive management of the bad teeth.

## 2017-04-10 NOTE — ED Triage Notes (Signed)
Pt c/o R ear pain with ringing in her ear, pt recently went to the dentist and shes supposed to have a tooth on the R side extracted. Pt took an antibiotic. Pt still having dental pain.
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
## 2017-04-14 ENCOUNTER — Encounter: Payer: Self-pay | Admitting: Physical Therapy

## 2017-04-14 ENCOUNTER — Ambulatory Visit: Payer: Medicare Other | Attending: Family | Admitting: Physical Therapy

## 2017-04-14 DIAGNOSIS — R262 Difficulty in walking, not elsewhere classified: Secondary | ICD-10-CM | POA: Insufficient documentation

## 2017-04-14 DIAGNOSIS — M25561 Pain in right knee: Secondary | ICD-10-CM | POA: Insufficient documentation

## 2017-04-14 DIAGNOSIS — M25571 Pain in right ankle and joints of right foot: Secondary | ICD-10-CM | POA: Insufficient documentation

## 2017-04-14 DIAGNOSIS — R296 Repeated falls: Secondary | ICD-10-CM | POA: Diagnosis not present

## 2017-04-14 DIAGNOSIS — M6281 Muscle weakness (generalized): Secondary | ICD-10-CM | POA: Insufficient documentation

## 2017-04-14 DIAGNOSIS — G8929 Other chronic pain: Secondary | ICD-10-CM
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
## 2017-04-16 DIAGNOSIS — F310 Bipolar disorder, current episode hypomanic: Secondary | ICD-10-CM | POA: Diagnosis not present

## 2017-04-16 DIAGNOSIS — F900 Attention-deficit hyperactivity disorder, predominantly inattentive type: Secondary | ICD-10-CM | POA: Diagnosis not present

## 2017-04-17 ENCOUNTER — Encounter (INDEPENDENT_AMBULATORY_CARE_PROVIDER_SITE_OTHER): Payer: Self-pay | Admitting: Orthopaedic Surgery

## 2017-04-17 ENCOUNTER — Ambulatory Visit (INDEPENDENT_AMBULATORY_CARE_PROVIDER_SITE_OTHER): Payer: Medicare Other | Admitting: Orthopaedic Surgery

## 2017-04-17 DIAGNOSIS — M542 Cervicalgia: Secondary | ICD-10-CM | POA: Diagnosis not present

## 2017-04-17 MED ORDER — NAPROXEN 500 MG PO TABS: 500.0000 mg | ORAL_TABLET | Freq: Two times a day (BID) | ORAL | 1 refills | Status: DC | PRN

## 2017-04-17 MED ORDER — METHOCARBAMOL 500 MG PO TABS: 500.0000 mg | ORAL_TABLET | Freq: Three times a day (TID) | ORAL | 0 refills | Status: DC | PRN
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
## 2017-04-21 ENCOUNTER — Ambulatory Visit: Payer: Medicare Other | Admitting: Physical Therapy

## 2017-04-22 ENCOUNTER — Encounter (INDEPENDENT_AMBULATORY_CARE_PROVIDER_SITE_OTHER): Payer: Self-pay

## 2017-04-22 ENCOUNTER — Ambulatory Visit (INDEPENDENT_AMBULATORY_CARE_PROVIDER_SITE_OTHER): Payer: Medicare Other | Admitting: Orthopedic Surgery

## 2017-04-23 DIAGNOSIS — F310 Bipolar disorder, current episode hypomanic: Secondary | ICD-10-CM | POA: Diagnosis not present

## 2017-04-23 DIAGNOSIS — F900 Attention-deficit hyperactivity disorder, predominantly inattentive type: Secondary | ICD-10-CM | POA: Diagnosis not present

## 2017-04-24 ENCOUNTER — Ambulatory Visit: Payer: Medicare Other | Admitting: Physical Therapy

## 2017-04-27 ENCOUNTER — Ambulatory Visit: Payer: Medicare Other | Admitting: Physical Therapy

## 2017-04-27 ENCOUNTER — Ambulatory Visit (INDEPENDENT_AMBULATORY_CARE_PROVIDER_SITE_OTHER): Payer: Medicare Other | Admitting: Physician Assistant

## 2017-04-27 ENCOUNTER — Telehealth: Payer: Self-pay | Admitting: Physical Therapy

## 2017-04-27 ENCOUNTER — Telehealth (INDEPENDENT_AMBULATORY_CARE_PROVIDER_SITE_OTHER): Payer: Self-pay | Admitting: Orthopaedic Surgery

## 2017-04-27 VITALS — BP 119/66 | HR 64 | Temp 98.9°F | Resp 16 | Ht 61.0 in | Wt 176.0 lb

## 2017-04-27 DIAGNOSIS — M542 Cervicalgia: Secondary | ICD-10-CM | POA: Diagnosis not present

## 2017-04-27 DIAGNOSIS — R202 Paresthesia of skin: Secondary | ICD-10-CM | POA: Diagnosis not present

## 2017-04-27 DIAGNOSIS — M79609 Pain in unspecified limb: Secondary | ICD-10-CM | POA: Diagnosis not present

## 2017-04-27 MED ORDER — CYCLOBENZAPRINE HCL 10 MG PO TABS: 5.0000 mg | ORAL_TABLET | Freq: Three times a day (TID) | ORAL | 0 refills | Status: DC | PRN

## 2017-04-27 MED ORDER — HYDROCODONE-ACETAMINOPHEN 5-325 MG PO TABS: 1.0000 | ORAL_TABLET | Freq: Four times a day (QID) | ORAL | 0 refills | Status: DC | PRN

## 2017-04-27 NOTE — Telephone Encounter (Signed)
Is there anything else she can take instead

## 2017-04-27 NOTE — Telephone Encounter (Signed)
Called & LM regarding appointment today at 3:45. NS for 2 appointments last week- per attendance policy, will leave appointment today but will have to schedule one at a time from now on.  Yolanda Jones C. Darlyn Repsher PT, DPT 04/27/17 7:55 AM

## 2017-04-27 NOTE — Patient Instructions (Addendum)
  I would like you to contact your orthopedist and see if you can get an appointment.  Contact the office first thing in the morning, and then contact the office with the appointment.  I will then see if I can get something sooner.   Please do not operate heavy machinery when taking the flexeril.  I am giving you a 5 day course of the norco.  Please take only for severe pain.  Please avoid computer use at this time.  Ice the back of the neck.    Cervical Radiculopathy Cervical radiculopathy means that a nerve in the neck is pinched or bruised. This can cause pain or loss of feeling (numbness) that runs from your neck to your arm and fingers. Follow these instructions at home: Managing pain  Take over-the-counter and prescription medicines only as told by your doctor.  If directed, put ice on the injured or painful area. ? Put ice in a plastic bag. ? Place a towel between your skin and the bag. ? Leave the ice on for 20 minutes, 2-3 times per day.  If ice does not help, you can try using heat. Take a warm shower or warm bath, or use a heat pack as told by your doctor.  You may try a gentle neck and shoulder massage. Activity  Rest as needed. Follow instructions from your doctor about any activities to avoid.  Do exercises as told by your doctor or physical therapist. General instructions  If you were given a soft collar, wear it as told by your doctor.  Use a flat pillow when you sleep.  Keep all follow-up visits as told by your doctor. This is important. Contact a doctor if:  Your condition does not improve with treatment. Get help right away if:  Your pain gets worse and is not controlled with medicine.  You lose feeling or feel weak in your hand, arm, face, or leg.  You have a fever.  You have a stiff neck.  You cannot control when you poop or pee (have incontinence).  You have trouble with walking, balance, or talking. This information is not intended to replace  advice given to you by your health care provider. Make sure you discuss any questions you have with your health care provider. Document Released: 01/16/2011 Document Revised: 07/05/2015 Document Reviewed: 03/23/2014 Elsevier Interactive Patient Education  2018 [employer].   IF you received an x-ray today, you will receive an invoice from [facility]. Please contact [facility] at [phone] with questions or concerns regarding your invoice.   IF you received labwork today, you will receive an invoice from [facility]. Please contact [facility] at [phone] with questions or concerns regarding your invoice.   Our billing staff will not be able to assist you with questions regarding bills from these companies.  You will be contacted with the lab results as soon as they are available. The fastest way to get your results is to activate your My Chart account. Instructions are located on the last page of this paperwork. If you have not heard from [place] regarding the results in 2 weeks, please contact this office.
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
## 2017-04-27 NOTE — Telephone Encounter (Signed)
Methocarbamol  Naproxen   Pt was subscribed med above and she had a bad allergic reaction to one of the two meds

## 2017-04-27 NOTE — Telephone Encounter (Signed)
[place]

## 2017-04-27 NOTE — Telephone Encounter (Signed)
thnx

## 2017-04-27 NOTE — Telephone Encounter (Signed)
See message below °

## 2017-04-28 ENCOUNTER — Telehealth: Payer: Self-pay | Admitting: Physician Assistant

## 2017-04-28 ENCOUNTER — Other Ambulatory Visit: Payer: Self-pay | Admitting: Internal Medicine

## 2017-04-28 NOTE — Telephone Encounter (Signed)
Copied from [place]. Topic: Quick Communication - See Telephone Encounter >> Apr 28, 2017  3:55 PM Lolita Rieger, RMA wrote: CRM for notification. See Telephone encounter for:   04/28/17.pt called back to say the orthopedic doctor that does her steroid injection you wanted her to see will not be available until 05/04/17 and she just wanted you to be aware

## 2017-04-28 NOTE — Telephone Encounter (Signed)
Unfortunately, we cannot write  narcotics.  She is allergic to nsaids and tramadol so we don't have any other options

## 2017-04-29 NOTE — Telephone Encounter (Signed)
Called patient to advise. I recommended her to seek her PCP to see what they would recommend since she states she allergic to all these meds & we cannot give her any Narcotics. States she does not have a PCP and advised her she will need to look for one.   She states the inj given by Dr Ernestina Patches helped a lot for about a month. Would like to know if she can get another one?

## 2017-04-29 NOTE — Telephone Encounter (Signed)
I talked with her this morning appt scheduled for 05/18/17

## 2017-04-29 NOTE — Telephone Encounter (Signed)
I thought we already referred her to dr. Ernestina Patches?  I just saw her on 3/8

## 2017-04-29 NOTE — Telephone Encounter (Signed)
Call patient to sched appt please

## 2017-04-30 ENCOUNTER — Encounter: Payer: Medicare Other | Admitting: Physical Therapy

## 2017-05-01 NOTE — Telephone Encounter (Signed)
Ok great.  This is only 6 days.

## 2017-05-05 ENCOUNTER — Encounter: Payer: Medicare Other | Admitting: Physical Therapy

## 2017-05-07 DIAGNOSIS — F310 Bipolar disorder, current episode hypomanic: Secondary | ICD-10-CM | POA: Diagnosis not present

## 2017-05-07 DIAGNOSIS — F900 Attention-deficit hyperactivity disorder, predominantly inattentive type: Secondary | ICD-10-CM | POA: Diagnosis not present

## 2017-05-08 ENCOUNTER — Other Ambulatory Visit: Payer: Self-pay

## 2017-05-08 ENCOUNTER — Ambulatory Visit (INDEPENDENT_AMBULATORY_CARE_PROVIDER_SITE_OTHER): Payer: Medicare Other | Admitting: Emergency Medicine

## 2017-05-08 ENCOUNTER — Encounter: Payer: Self-pay | Admitting: Emergency Medicine

## 2017-05-08 ENCOUNTER — Encounter: Payer: Medicare Other | Admitting: Physical Therapy

## 2017-05-08 VITALS — BP 120/66 | HR 70 | Temp 98.4°F | Resp 16 | Ht 61.0 in | Wt 171.4 lb

## 2017-05-08 DIAGNOSIS — M79609 Pain in unspecified limb: Secondary | ICD-10-CM | POA: Insufficient documentation

## 2017-05-08 DIAGNOSIS — R202 Paresthesia of skin: Secondary | ICD-10-CM | POA: Diagnosis not present

## 2017-05-08 DIAGNOSIS — M542 Cervicalgia: Secondary | ICD-10-CM

## 2017-05-08 MED ORDER — HYDROCODONE-ACETAMINOPHEN 5-325 MG PO TABS: 1.0000 | ORAL_TABLET | Freq: Four times a day (QID) | ORAL | 0 refills | Status: DC | PRN

## 2017-05-08 NOTE — Patient Instructions (Addendum)
     IF you received an x-ray today, you will receive an invoice from [facility]. Please contact [facility] at [phone] with questions or concerns regarding your invoice.   IF you received labwork today, you will receive an invoice from [facility]. Please contact [facility] at [phone] with questions or concerns regarding your invoice.   Our billing staff will not be able to assist you with questions regarding bills from these companies.  You will be contacted with the lab results as soon as they are available. The fastest way to get your results is to activate your My Chart account. Instructions are located on the last page of this paperwork. If you have not heard from us regarding the results in 2 weeks, please contact this office.     Cervical Radiculopathy Cervical radiculopathy means that a nerve in the neck is pinched or bruised. This can cause pain or loss of feeling (numbness) that runs from your neck to your arm and fingers. Follow these instructions at home: Managing pain  Take over-the-counter and prescription medicines only as told by your doctor.  If directed, put ice on the injured or painful area. ? Put ice in a plastic bag. ? Place a towel between your skin and the bag. ? Leave the ice on for 20 minutes, 2-3 times per day.  If ice does not help, you can try using heat. Take a warm shower or warm bath, or use a heat pack as told by your doctor.  You may try a gentle neck and shoulder massage. Activity  Rest as needed. Follow instructions from your doctor about any activities to avoid.  Do exercises as told by your doctor or physical therapist. General instructions  If you were given a soft collar, wear it as told by your doctor.  Use a flat pillow when you sleep.  Keep all follow-up visits as told by your doctor. This is important. Contact a doctor if:  Your condition does not improve with treatment. Get help right away if:  Your  pain gets worse and is not controlled with medicine.  You lose feeling or feel weak in your hand, arm, face, or leg.  You have a fever.  You have a stiff neck.  You cannot control when you poop or pee (have incontinence).  You have trouble with walking, balance, or talking. This information is not intended to replace advice given to you by your health care provider. Make sure you discuss any questions you have with your health care provider. Document Released: 01/16/2011 Document Revised: 07/05/2015 Document Reviewed: 03/23/2014 Elsevier Interactive Patient Education  2018 [employer]  

## 2017-05-08 NOTE — Assessment & Plan Note (Addendum)
Chronic problem.  Has appointment to see the orthopedist 05/18/2017.  Agreed to prescribe short course of hydrocodone.  For short-term as needed treatment only.  Advised that we will not be her chronic pain management team.  I will defer pain management referral option to patient's orthopedist.
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
## 2017-05-11 ENCOUNTER — Encounter: Payer: Medicare Other | Admitting: Physical Therapy

## 2017-05-11 ENCOUNTER — Ambulatory Visit: Payer: Medicare Other | Attending: Family | Admitting: Physical Therapy

## 2017-05-13 ENCOUNTER — Encounter: Payer: Medicare Other | Admitting: Physical Therapy

## 2017-05-13 DIAGNOSIS — F900 Attention-deficit hyperactivity disorder, predominantly inattentive type: Secondary | ICD-10-CM | POA: Diagnosis not present

## 2017-05-13 DIAGNOSIS — F310 Bipolar disorder, current episode hypomanic: Secondary | ICD-10-CM | POA: Diagnosis not present

## 2017-05-18 ENCOUNTER — Encounter (HOSPITAL_COMMUNITY): Payer: Self-pay | Admitting: Family Medicine

## 2017-05-18 ENCOUNTER — Ambulatory Visit (HOSPITAL_COMMUNITY)
Admission: EM | Admit: 2017-05-18 | Discharge: 2017-05-18 | Disposition: A | Discharge status: Home / Self Care | Payer: Medicare Other | Attending: Family Medicine | Admitting: Family Medicine

## 2017-05-18 ENCOUNTER — Ambulatory Visit (INDEPENDENT_AMBULATORY_CARE_PROVIDER_SITE_OTHER): Payer: Medicare Other

## 2017-05-18 ENCOUNTER — Other Ambulatory Visit: Payer: Self-pay | Admitting: Emergency Medicine

## 2017-05-18 ENCOUNTER — Encounter (INDEPENDENT_AMBULATORY_CARE_PROVIDER_SITE_OTHER): Payer: Self-pay | Admitting: Physical Medicine and Rehabilitation

## 2017-05-18 ENCOUNTER — Ambulatory Visit (INDEPENDENT_AMBULATORY_CARE_PROVIDER_SITE_OTHER): Payer: Medicare Other | Admitting: Physical Medicine and Rehabilitation

## 2017-05-18 VITALS — BP 120/78 | HR 89 | Temp 98.6°F

## 2017-05-18 DIAGNOSIS — M5412 Radiculopathy, cervical region: Secondary | ICD-10-CM

## 2017-05-18 DIAGNOSIS — M4802 Spinal stenosis, cervical region: Secondary | ICD-10-CM | POA: Diagnosis not present

## 2017-05-18 DIAGNOSIS — M5010 Cervical disc disorder with radiculopathy, unspecified cervical region: Secondary | ICD-10-CM | POA: Diagnosis not present

## 2017-05-18 DIAGNOSIS — M549 Dorsalgia, unspecified: Secondary | ICD-10-CM | POA: Diagnosis not present

## 2017-05-18 DIAGNOSIS — M542 Cervicalgia: Secondary | ICD-10-CM

## 2017-05-18 MED ORDER — HYDROCODONE-ACETAMINOPHEN 5-325 MG PO TABS: 1.0000 | ORAL_TABLET | Freq: Four times a day (QID) | ORAL | 0 refills | Status: DC | PRN

## 2017-05-18 MED ORDER — METHYLPREDNISOLONE ACETATE 80 MG/ML IJ SUSP
80.0000 mg | Freq: Once | INTRAMUSCULAR | Status: AC
  Administered 2017-05-18: 80 mg

## 2017-05-18 NOTE — Patient Instructions (Signed)

## 2017-05-18 NOTE — ED Triage Notes (Signed)
Pt here for chronic neck pain. Reports that she has received a steroid shot in the past which helped. The pain radiates into arm with numbness and tingling. She had an injection today but believes that the pain is getting worse.

## 2017-05-18 NOTE — Discharge Instructions (Addendum)
Please see provided information from [place]:   [employer]   *At any time if you have questions or concerns they can be answered by calling [phone]   All Patients: You may experience an increase in your symptoms for the first 2 days (it can take 2 days to 2 weeks for the steroid/cortisone to have its maximal effect). You may use ice to the site for the first 24 hours; 20 minutes on and 20 minutes off and may use heat after that time. You may resume and continue your current pain medications. If you need a refill please contact the prescribing physician. You may resume her regular medications if any were stopped for the procedure. You may shower but no swimming, tub bath or Jacuzzi for 24 hours. Please remove bandage after 4 hours. You may resume light activities as tolerated. If you had Spine Injection, you should not drive for the next 3 hours due to anesthetics used in the procedure. Please have someone drive for you.   *If you have had sedation, Valium, Xanax, or lorazepam: Do not drive or use public transportation for 24 hours, do not operating hazardous machinery or make important personal/business decisions for 24 hours.   POSSIBLE STEROID SIDE EFFECTS: If experienced these should only last for a short period. Change in menstrual flow                   Edema in (swelling)                Increased appetite Skin flushing (redness)                       Skin rash/acne                        Thrush (oral) Vaginitis                                              Increased sweating                 Depression Increased blood glucose levels          Cramping and leg/calf             Euphoria (feeling happy)   POSSIBLE PROCEDURE SIDE EFFECTS: Please call our office if concerned. Increased pain            Increased numbness/tingling              Headache Nausea/vomiting         Hematoma (bruising/bleeding)           Edema (swelling at the  site) Weakness                   Infection (red/drainage at site)            Fever greater than 100.2F   *In the event of a headache after epidural steroid injection: Drink plenty of fluids, especially water and try to lay flat when possible. If the headache does not get better after a few days or as always if concerned please call the office.
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
## 2017-05-18 NOTE — Progress Notes (Signed)
 .  Numeric Pain Rating Scale and Functional Assessment Average Pain 7   In the last MONTH (on 0-10 scale) has pain interfered with the following?  1. General activity like being  able to carry out your everyday physical activities such as walking, climbing stairs, carrying groceries, or moving a chair?  Rating(6)   +Driver, -BT, -Dye Allergies.  

## 2017-05-18 NOTE — Telephone Encounter (Signed)
Patient wants Dr. Mitchel Honour to know that she is only calling him for this because she did get her steroid injection today but the pain is increasing and [facility] office is closed.    LOV: 05/08/17 with Dr. Mitchel Honour  PCP: Richarda Overlie   [facility] [address]

## 2017-05-18 NOTE — Telephone Encounter (Signed)
Copied from [place] [id number]. Topic: Quick Communication - Rx Refill/Question >> May 18, 2017  5:56 PM Waylan Rocher, Lumin L wrote: Medication: HYDROcodone-acetaminophen ([place]) 5-325 MG tablet Has the patient contacted their pharmacy? Yes.   (Agent: If no, request that the patient contact the pharmacy for the refill.) Preferred Pharmacy (with phone number or street name): [facility] - [address], [place] [address] Phone: [phone] Fax: [phone] Agent: Please be advised that RX refills may take up to 3 business days. We ask that you follow-up with your pharmacy.  Patient wants Dr. Mitchel Honour to know that she is only calling him for this because she did get her steroid injection today but the pain is increasing and [facility] office is closed.

## 2017-05-19 ENCOUNTER — Telehealth (INDEPENDENT_AMBULATORY_CARE_PROVIDER_SITE_OTHER): Payer: Self-pay | Admitting: Physical Medicine and Rehabilitation

## 2017-05-19 NOTE — Telephone Encounter (Signed)
Patient requesting refill and was seen in ED 05/18/2017. Dr Mannie Stabile gave her 5 tablets of Hydrocodone-acetaminophen to take prn. Please advise, thank you.

## 2017-05-19 NOTE — Telephone Encounter (Signed)
Should OV since bad enough for urgent care

## 2017-05-19 NOTE — Telephone Encounter (Signed)
Refill not appropriate at this time. Thanks.

## 2017-05-19 NOTE — Telephone Encounter (Signed)
Worked in for 4/10 at 0900.

## 2017-05-20 ENCOUNTER — Encounter (INDEPENDENT_AMBULATORY_CARE_PROVIDER_SITE_OTHER): Payer: Self-pay | Admitting: Physical Medicine and Rehabilitation

## 2017-05-20 ENCOUNTER — Ambulatory Visit (INDEPENDENT_AMBULATORY_CARE_PROVIDER_SITE_OTHER): Payer: Medicare Other | Admitting: Physical Medicine and Rehabilitation

## 2017-05-20 ENCOUNTER — Encounter: Payer: Self-pay | Admitting: Physician Assistant

## 2017-05-20 VITALS — BP 111/75 | HR 53

## 2017-05-20 DIAGNOSIS — M542 Cervicalgia: Secondary | ICD-10-CM

## 2017-05-20 DIAGNOSIS — M545 Low back pain, unspecified: Secondary | ICD-10-CM

## 2017-05-20 DIAGNOSIS — M5412 Radiculopathy, cervical region: Secondary | ICD-10-CM | POA: Diagnosis not present

## 2017-05-20 DIAGNOSIS — F900 Attention-deficit hyperactivity disorder, predominantly inattentive type: Secondary | ICD-10-CM | POA: Diagnosis not present

## 2017-05-20 DIAGNOSIS — M4802 Spinal stenosis, cervical region: Secondary | ICD-10-CM

## 2017-05-20 DIAGNOSIS — F310 Bipolar disorder, current episode hypomanic: Secondary | ICD-10-CM | POA: Diagnosis not present

## 2017-05-20 MED ORDER — HYDROCODONE-ACETAMINOPHEN 5-325 MG PO TABS: 1.0000 | ORAL_TABLET | Freq: Two times a day (BID) | ORAL | 0 refills | Status: AC | PRN

## 2017-05-20 NOTE — Progress Notes (Signed)
Numeric Pain Rating Scale and Functional Assessment Average Pain 6   In the last MONTH (on 0-10 scale) has pain interfered with the following?  1. General activity like being  able to carry out your everyday physical activities such as walking, climbing stairs, carrying groceries, or moving a chair?  Rating(6)  

## 2017-05-21 NOTE — Procedures (Signed)
Cervical Epidural Steroid Injection - Interlaminar Approach with Fluoroscopic Guidance  Patient: Yolanda Jones      Date of Birth: [birth year] MRN: [id number] PCP: Joretta Bachelor, PA      Visit Date: 05/18/2017   Universal Protocol:    Date/Time: 04/11/196:04 AM  Consent Given By: the patient  Position: PRONE  Additional Comments: Vital signs were monitored before and after the procedure. Patient was prepped and draped in the usual sterile fashion. The correct patient, procedure, and site was verified.   Injection Procedure Details:  Procedure Site One Meds Administered:  Meds ordered this encounter  Medications  . methylPREDNISolone acetate (DEPO-MEDROL) injection 80 mg     Laterality: Right  Location/Site: C7-T1  Needle size: 20 G  Needle type: Touhy  Needle Placement: Paramedian epidural space  Findings:  -Comments: Excellent flow of contrast into the epidural space.  Procedure Details: Using a paramedian approach from the side mentioned above, the region overlying the inferior lamina was localized under fluoroscopic visualization and the soft tissues overlying this structure were infiltrated with 4 ml. of 1% Lidocaine without Epinephrine. A # 20 gauge, Tuohy needle was inserted into the epidural space using a paramedian approach.  The epidural space was localized using loss of resistance along with lateral and contralateral oblique bi-planar fluoroscopic views.  After negative aspirate for air, blood, and CSF, a 2 ml. volume of Isovue-250 was injected into the epidural space and the flow of contrast was observed. Radiographs were obtained for documentation purposes.   The injectate was administered into the level noted above.  Additional Comments:  The patient tolerated the procedure well Dressing: Band-Aid    Post-procedure details: Patient was observed during the procedure. Post-procedure instructions were reviewed.  Patient left the clinic in  stable condition.
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
## 2017-05-25 ENCOUNTER — Ambulatory Visit (INDEPENDENT_AMBULATORY_CARE_PROVIDER_SITE_OTHER): Payer: Medicare Other

## 2017-05-25 ENCOUNTER — Encounter: Payer: Self-pay | Admitting: Physician Assistant

## 2017-05-25 ENCOUNTER — Ambulatory Visit (INDEPENDENT_AMBULATORY_CARE_PROVIDER_SITE_OTHER): Payer: Medicare Other | Admitting: Physician Assistant

## 2017-05-25 ENCOUNTER — Other Ambulatory Visit: Payer: Self-pay

## 2017-05-25 VITALS — BP 108/76 | HR 99 | Temp 99.0°F | Resp 16 | Ht 62.0 in | Wt 175.4 lb

## 2017-05-25 DIAGNOSIS — M25571 Pain in right ankle and joints of right foot: Secondary | ICD-10-CM

## 2017-05-25 DIAGNOSIS — Z765 Malingerer [conscious simulation]: Secondary | ICD-10-CM | POA: Diagnosis not present

## 2017-05-25 DIAGNOSIS — S99911A Unspecified injury of right ankle, initial encounter: Secondary | ICD-10-CM | POA: Diagnosis not present

## 2017-05-25 NOTE — Patient Instructions (Addendum)
You will receive a phone call to schedule an appointment with sports medicine.  You can take tylenol and ibuprofen for pain as needed.    Ankle Sprain An ankle sprain is a stretch or tear in one of the tough, fiber-like tissues (ligaments) in the ankle. The ligaments in your ankle help to hold the bones of the ankle together. What are the causes? This condition is often caused by stepping on or falling on the outer edge of the foot. What increases the risk? This condition is more likely to develop in people who play sports. What are the signs or symptoms? Symptoms of this condition include:  Pain in your ankle.  Swelling.  Bruising. Bruising may develop right after you sprain your ankle or 1-2 days later.  Trouble standing or walking, especially when you turn or change directions.  How is this diagnosed? This condition is diagnosed with a physical exam. During the exam, your health care provider will press on certain parts of your foot and ankle and try to move them in certain ways. X-rays may be taken to see how severe the sprain is and to check for broken bones. How is this treated? This condition may be treated with:  A brace. This is used to keep the ankle from moving until it heals.  An elastic bandage. This is used to support the ankle.  Crutches.  Pain medicine.  Surgery. This may be needed if the sprain is severe.  Physical therapy. This may help to improve the range of motion in the ankle.  Follow these instructions at home:  Rest your ankle.  Take over-the-counter and prescription medicines only as told by your health care provider.  For 2-3 days, keep your ankle raised (elevated) above the level of your heart as much as possible.  If directed, apply ice to the area: ? Put ice in a plastic bag. ? Place a towel between your skin and the bag. ? Leave the ice on for 20 minutes, 2-3 times a day.  If you were given a brace: ? Wear it as directed. ? Remove it  to shower or bathe. ? Try not to move your ankle much, but wiggle your toes from time to time. This helps to prevent swelling.  If you were given an elastic bandage (dressing): ? Remove it to shower or bathe. ? Try not to move your ankle much, but wiggle your toes from time to time. This helps to prevent swelling. ? Adjust the dressing to make it more comfortable if it feels too tight. ? Loosen the dressing if you have numbness or tingling in your foot, or if your foot becomes cold and blue.  If you have crutches, use them as told by your health care provider. Continue to use them until you can walk without feeling pain in your ankle. Contact a health care provider if:  You have rapidly increasing bruising or swelling.  Your pain is not relieved with medicine. Get help right away if:  Your toes or foot becomes numb or blue.  You have severe pain that gets worse. This information is not intended to replace advice given to you by your health care provider. Make sure you discuss any questions you have with your health care provider. Document Released: 01/27/2005 Document Revised: 03/06/2016 Document Reviewed: 08/29/2014 Elsevier Interactive Patient Education  [employer].  Thank you for coming in today. I hope you feel we met your needs. Feel free to call PCP if you have  any questions or further requests. Please consider signing up for MyChart if you do not already have it, as this is a great way to communicate with me.  Best,  Whitney McVey, PA-C  IF you received an x-ray today, you will receive an invoice from [facility]. Please contact [facility] at [phone] with questions or concerns regarding your invoice.   IF you received labwork today, you will receive an invoice from [place]. Please contact [facility] at [phone] with questions or concerns regarding your invoice.   Our billing staff will not be able to assist you with questions regarding bills  from these companies.  You will be contacted with the lab results as soon as they are available. The fastest way to get your results is to activate your My Chart account. Instructions are located on the last page of this paperwork. If you have not heard from [place] regarding the results in 2 weeks, please contact this office.

## 2017-05-25 NOTE — Progress Notes (Signed)
Yolanda Jones  MRN: [id number] DOB: [birth year]  PCP: Ivar Drape D, PA  Subjective:  Pt is a 37 year old female who presents to clinic for right ankle pain x a few days. She rolled her ankle and went down on her left knee then left shoulder. Pain is located on the inner ankle below malleolus.  She has been wearing a very old and well worn ankle brace.  She would like something for pain.  H/o NSAID allergy, "I'm allergic to all NSAIDs except ibuprofen."  She has long h/o ankle problems, twisting her ankle about once a year.  She has been to rehab for this problem > 1 month ago for her knee pain and ankles. She missed a few appointments and did not follow up.  She complains that she lives in an apartment building on the 7th floor where there is no elevator.   Review of Systems  Musculoskeletal: Positive for arthralgias (right ankle) and gait problem. Negative for joint swelling.  Skin: Negative.   Neurological: Negative for weakness and numbness.    Patient Active Problem List   Diagnosis Date Noted  . Paresthesia and pain of right extremity 05/08/2017  . Cervicalgia 04/17/2017  . Medication monitoring encounter 09/22/2016  . Pain in right ankle and joints of right foot 04/01/2016  . Spondylolysis, cervical region 04/01/2016  . Brachial plexus dysfunction 10/08/2015  . Chronic renal insufficiency, stage II (mild) 07/04/2015  . Screening examination for venereal disease 08/17/2014  . Encounter for long-term (current) use of medications 08/17/2014  . 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 Weakness of left arm 06/07/2013  . Abnormal Pap smear of cervix 12/14/2012  . Weight gain due to medication 07/15/2012  . H/O hyperprolactinemia   . Human immunodeficiency virus (HIV) disease ([place]) 12/13/2010  . 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 PTSD (post-traumatic stress disorder) 12/13/2010  . Generalized anxiety disorder  12/13/2010  . Bipolar 1 disorder, depressed, moderate ([place]) 12/13/2010  . Personality disorder ([place]) 12/13/2010  . 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 HYPERTHYROIDISM 10/08/2006    Current Outpatient Medications on File Prior to Visit  Medication Sig Dispense Refill  . ALPRAZolam (XANAX) 0.5 MG tablet Take 1 mg by mouth 2 (two) times daily as needed for anxiety or sleep.   3  . amphetamine-dextroamphetamine (ADDERALL) 20 MG tablet Take 20 mg by mouth 3 (three) times daily.    . benztropine (COGENTIN) 2 MG tablet Take 2 mg by mouth daily.    . bictegravir-emtricitabine-tenofovir AF (BIKTARVY) 50-200-25 MG TABS tablet Take 1 tablet by mouth daily. 30 tablet 11  . cyclobenzaprine (FLEXERIL) 10 MG tablet Take 0.5-1 tablets (5-10 mg total) by mouth 3 (three) times daily as needed. 30 tablet 0  . esomeprazole (NEXIUM) 40 MG capsule Take 40 mg by mouth daily before breakfast.     . INVEGA 1.5 MG TB24 Take 3 mg by mouth daily.   0  . levonorgestrel (MIRENA) 20 MCG/24HR IUD 1 each by Intrauterine route once.    . Multiple Vitamin (MULTIVITAMIN WITH MINERALS) TABS tablet Take 1 tablet by mouth daily.    . sertraline (ZOLOFT) 100 MG tablet Take 150 mg by mouth daily.     . valACYclovir (VALTREX) 500 MG tablet TAKE 1 TABLET BY MOUTH DAILY 30 tablet 5  . gabapentin (NEURONTIN) 300 MG capsule Take 1 capsule (300 mg total) by mouth 3 (three) times daily. 3 times a day when necessary neuropathy pain (  Patient not taking: Reported on 05/25/2017) 90 capsule 3  . HYDROcodone-acetaminophen (NORCO/VICODIN) 5-325 MG tablet Take 1 tablet by mouth 2 (two) times daily as needed for up to 5 days for moderate pain or severe pain. (Patient not taking: Reported on 05/25/2017) 10 tablet 0  . methocarbamol (ROBAXIN) 500 MG tablet Take 1 tablet (500 mg total) by mouth every 8 (eight) hours as needed for muscle spasms. (Patient not taking: Reported on 05/25/2017) 30 tablet 0  . naproxen (NAPROSYN) 500 MG tablet Take 1 tablet (500 mg  total) by mouth 2 (two) times daily between meals as needed. (Patient not taking: Reported on 05/25/2017) 30 tablet 1   No current facility-administered medications on file prior to visit.     Allergies  Allergen Reactions  . Gabapentin   . Naproxen   . Meloxicam Other (See Comments)    Swelling in face   . Oxaprozin Other (See Comments)    Swelling in face   . Prednisone Other (See Comments)    homicidal  . Strattera [Atomoxetine Hcl] Swelling  . Tramadol Other (See Comments)    Seizure      Objective:  BP 108/76   Pulse 99   Temp 99 F (37.2 C) (Oral)   Resp 16   Ht 5\' 2"  (1.575 m)   Wt 175 lb 6.4 oz (79.6 kg)   SpO2 98%   BMI 32.08 kg/m   Physical Exam  Constitutional: She is oriented to person, place, and time. No distress.  HENT:  Mouth/Throat: Abnormal dentition.  Musculoskeletal:       Right ankle: She exhibits normal range of motion, no swelling, no ecchymosis and no deformity. Tenderness. Medial malleolus (inferior to medial malleolus.) tenderness found. Achilles tendon normal.  Pt cannot keep her balance with standing toe raises.   Neurological: She is alert and oriented to person, place, and time.  Skin: Skin is warm and dry.  Psychiatric: She has a normal mood and affect. Judgment normal.  Speech is slightly slurred. Behavior and movements are slightly hyperactive.   Vitals reviewed.   Dg Ankle Complete Right  Result Date: 05/25/2017 CLINICAL DATA:  Ankle injury, pain EXAM: RIGHT ANKLE - COMPLETE 3+ VIEW COMPARISON:  09/01/2012 FINDINGS: Small bony density below the medial malleolus is new since the prior study. This is well corticated and appears to be due to chronic injury. No acute fracture or effusion. Joint space normal. IMPRESSION: Chronic appearing injury tip of medial malleolus. No acute fracture. Electronically Signed   By: Franchot Gallo M.D.   On: 05/25/2017 16:56    Assessment and Plan :  1. Pain in joint involving right ankle and foot 2.  Drug seeking behavoir - DG Ankle Complete Right; Future - Pt presents c/o right ankle pain s/p fall - this is an acute on chronic problem for this pt. On PE, pt's speech is slightly slurred and behavior is mildly hyperactive. She endorses NSAID allergy and is asking for pain medication. Suspect possible drug seeking behavior. Controlled substance registry shows 5 Rx for Hydrocodone-Acetamin 5-325 mg from 5 different providers in the past month. Declined her request for pain medication, advised ibuprofen and/or tylenol for pain. X-ray shows chronic appearing injury with no acute fracture. Lace-up ankle brace placed on right ankle by CMA, referral to sports medicine for rehab evaluation.  Mercer Pod, PA-C  [facility] [place] 05/25/2017 4:30 PM

## 2017-05-26 DIAGNOSIS — Z765 Malingerer [conscious simulation]: Secondary | ICD-10-CM | POA: Insufficient documentation

## 2017-05-27 DIAGNOSIS — F310 Bipolar disorder, current episode hypomanic: Secondary | ICD-10-CM | POA: Diagnosis not present

## 2017-05-27 DIAGNOSIS — F900 Attention-deficit hyperactivity disorder, predominantly inattentive type: Secondary | ICD-10-CM | POA: Diagnosis not present

## 2017-05-28 ENCOUNTER — Encounter: Payer: Self-pay | Admitting: Family Medicine

## 2017-05-28 ENCOUNTER — Encounter (INDEPENDENT_AMBULATORY_CARE_PROVIDER_SITE_OTHER): Payer: Self-pay | Admitting: Physical Medicine and Rehabilitation

## 2017-05-28 ENCOUNTER — Ambulatory Visit (INDEPENDENT_AMBULATORY_CARE_PROVIDER_SITE_OTHER): Payer: Medicare Other | Admitting: Family Medicine

## 2017-05-28 VITALS — BP 132/86 | HR 107 | Temp 98.1°F | Ht 62.0 in | Wt 175.0 lb

## 2017-05-28 DIAGNOSIS — S93491A Sprain of other ligament of right ankle, initial encounter: Secondary | ICD-10-CM | POA: Diagnosis not present
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
## 2017-05-28 NOTE — Patient Instructions (Signed)
Please try the exercises  Please ice the ankle  Please wear the brace if you are out and about.  Please let me know if you would like to try physical therapy in the future.
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
## 2017-05-29 DIAGNOSIS — S93491A Sprain of other ligament of right ankle, initial encounter: Secondary | ICD-10-CM | POA: Insufficient documentation

## 2017-05-29 NOTE — Assessment & Plan Note (Addendum)
Grade 1 sprain.  No suggestion of a high ankle sprain. -Counseled on ASO brace -Counseled on home exercise therapy -If no improvement will consider formal physical therapy. -Consider orthotic use with some subtalar shift medially.

## 2017-06-03 DIAGNOSIS — F310 Bipolar disorder, current episode hypomanic: Secondary | ICD-10-CM | POA: Diagnosis not present

## 2017-06-03 DIAGNOSIS — F900 Attention-deficit hyperactivity disorder, predominantly inattentive type: Secondary | ICD-10-CM | POA: Diagnosis not present

## 2017-06-10 DIAGNOSIS — F310 Bipolar disorder, current episode hypomanic: Secondary | ICD-10-CM | POA: Diagnosis not present

## 2017-06-10 DIAGNOSIS — F900 Attention-deficit hyperactivity disorder, predominantly inattentive type: Secondary | ICD-10-CM | POA: Diagnosis not present

## 2017-06-18 ENCOUNTER — Telehealth (INDEPENDENT_AMBULATORY_CARE_PROVIDER_SITE_OTHER): Payer: Self-pay | Admitting: Orthopaedic Surgery

## 2017-06-18 ENCOUNTER — Ambulatory Visit (INDEPENDENT_AMBULATORY_CARE_PROVIDER_SITE_OTHER): Payer: Medicare Other | Admitting: Family Medicine

## 2017-06-18 ENCOUNTER — Encounter: Payer: Self-pay | Admitting: Family Medicine

## 2017-06-18 ENCOUNTER — Other Ambulatory Visit: Payer: Self-pay

## 2017-06-18 VITALS — BP 124/84 | HR 98 | Temp 98.2°F | Ht 61.0 in | Wt 177.6 lb

## 2017-06-18 DIAGNOSIS — B20 Human immunodeficiency virus [HIV] disease: Secondary | ICD-10-CM | POA: Diagnosis not present

## 2017-06-18 DIAGNOSIS — F3132 Bipolar disorder, current episode depressed, moderate: Secondary | ICD-10-CM | POA: Diagnosis not present

## 2017-06-18 DIAGNOSIS — M5010 Cervical disc disorder with radiculopathy, unspecified cervical region: Secondary | ICD-10-CM | POA: Diagnosis not present

## 2017-06-18 DIAGNOSIS — N182 Chronic kidney disease, stage 2 (mild): Secondary | ICD-10-CM

## 2017-06-18 MED ORDER — HYDROCODONE-ACETAMINOPHEN 5-325 MG PO TABS: 1.0000 | ORAL_TABLET | Freq: Four times a day (QID) | ORAL | 0 refills | Status: DC | PRN

## 2017-06-18 NOTE — Telephone Encounter (Signed)
Message sent in error
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
## 2017-06-18 NOTE — Patient Instructions (Signed)
     IF you received an x-ray today, you will receive an invoice from [facility]. Please contact [facility] at [phone] with questions or concerns regarding your invoice.   IF you received labwork today, you will receive an invoice from [facility]. Please contact [facility] at [phone] with questions or concerns regarding your invoice.   Our billing staff will not be able to assist you with questions regarding bills from these companies.  You will be contacted with the lab results as soon as they are available. The fastest way to get your results is to activate your My Chart account. Instructions are located on the last page of this paperwork. If you have not heard from us regarding the results in 2 weeks, please contact this office.     

## 2017-06-23 ENCOUNTER — Encounter (INDEPENDENT_AMBULATORY_CARE_PROVIDER_SITE_OTHER): Payer: Self-pay | Admitting: Orthopaedic Surgery

## 2017-06-23 ENCOUNTER — Ambulatory Visit (INDEPENDENT_AMBULATORY_CARE_PROVIDER_SITE_OTHER): Payer: Medicare Other | Admitting: Orthopaedic Surgery

## 2017-06-23 ENCOUNTER — Ambulatory Visit (INDEPENDENT_AMBULATORY_CARE_PROVIDER_SITE_OTHER): Payer: Medicare Other | Admitting: Family Medicine

## 2017-06-23 ENCOUNTER — Other Ambulatory Visit: Payer: Self-pay

## 2017-06-23 VITALS — BP 102/82 | HR 99 | Temp 98.8°F | Ht 61.0 in | Wt 170.8 lb

## 2017-06-23 DIAGNOSIS — M5010 Cervical disc disorder with radiculopathy, unspecified cervical region: Secondary | ICD-10-CM

## 2017-06-23 DIAGNOSIS — F3132 Bipolar disorder, current episode depressed, moderate: Secondary | ICD-10-CM | POA: Diagnosis not present

## 2017-06-23 DIAGNOSIS — B20 Human immunodeficiency virus [HIV] disease: Secondary | ICD-10-CM | POA: Diagnosis not present

## 2017-06-23 DIAGNOSIS — Z5181 Encounter for therapeutic drug level monitoring: Secondary | ICD-10-CM

## 2017-06-23 DIAGNOSIS — M5412 Radiculopathy, cervical region: Secondary | ICD-10-CM | POA: Diagnosis not present

## 2017-06-23 MED ORDER — LIDOCAINE 5 % EX PTCH: 1.0000 | MEDICATED_PATCH | CUTANEOUS | 0 refills | Status: DC

## 2017-06-23 MED ORDER — BACLOFEN 10 MG PO TABS
10.0000 mg | ORAL_TABLET | Freq: Three times a day (TID) | ORAL | 0 refills | Status: DC
Start: 2017-06-23 — End: 2017-08-16

## 2017-06-23 MED ORDER — CELECOXIB 100 MG PO CAPS: 100.0000 mg | ORAL_CAPSULE | Freq: Two times a day (BID) | ORAL | 0 refills | Status: DC

## 2017-06-23 NOTE — Progress Notes (Signed)
Patient is a 37 year old female who comes in today with recurrent cervical spine radiculopathy.  This is been ongoing for the past several years.  She is now having pain to the top of her shoulders and into the parascapular regions on both sides.  She does note tingling to her fingers as well.  In the past, she states that she has been unable to take anything other than ibuprofen for the pain due to side effects to include homicidal and suicidal ideations.  She has had epidural steroid injections by Dr. Ernestina Patches in the past.  The first 1 gave her great relief for about a month but the second, only 1 week.  At this point, she states that she is hurting so bad that she is somewhat thinking of hurting herself.  I have told her to contact her PCP right away.  We will also refer her to Dr. Kathyrn Sheriff for possible surgical intervention.  She will follow-up with [place] as needed.
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
## 2017-06-23 NOTE — Patient Instructions (Signed)
     IF you received an x-ray today, you will receive an invoice from [facility]. Please contact [facility] at [phone] with questions or concerns regarding your invoice.   IF you received labwork today, you will receive an invoice from [facility]. Please contact [facility] at [phone] with questions or concerns regarding your invoice.   Our billing staff will not be able to assist you with questions regarding bills from these companies.  You will be contacted with the lab results as soon as they are available. The fastest way to get your results is to activate your My Chart account. Instructions are located on the last page of this paperwork. If you have not heard from us regarding the results in 2 weeks, please contact this office.     

## 2017-06-24 ENCOUNTER — Encounter: Payer: Self-pay | Admitting: Family Medicine

## 2017-06-24 LAB — COMPREHENSIVE METABOLIC PANEL
ALT: 18 IU/L (ref 0–32)
AST: 34 IU/L (ref 0–40)
Albumin/Globulin Ratio: 1.6 (ref 1.2–2.2)
Albumin: 4.1 g/dL (ref 3.5–5.5)
Alkaline Phosphatase: 57 IU/L (ref 39–117)
BUN/Creatinine Ratio: 8 — ABNORMAL LOW (ref 9–23)
BUN: 10 mg/dL (ref 6–20)
Bilirubin Total: 0.2 mg/dL (ref 0.0–1.2)
CO2: 23 mmol/L (ref 20–29)
Calcium: 9.6 mg/dL (ref 8.7–10.2)
Chloride: 105 mmol/L (ref 96–106)
Creatinine, Ser: 1.28 mg/dL — ABNORMAL HIGH (ref 0.57–1.00)
GFR calc Af Amer: 61 mL/min/{1.73_m2} (ref >59)
GFR calc non Af Amer: 53 mL/min/{1.73_m2} — ABNORMAL LOW (ref >59)
Globulin, Total: 2.6 g/dL (ref 1.5–4.5)
Glucose: 83 mg/dL (ref 65–99)
Potassium: 4.7 mmol/L (ref 3.5–5.2)
Sodium: 141 mmol/L (ref 134–144)
Total Protein: 6.7 g/dL (ref 6.0–8.5)

## 2017-06-28 ENCOUNTER — Encounter (HOSPITAL_COMMUNITY): Payer: Self-pay | Admitting: Emergency Medicine

## 2017-06-28 ENCOUNTER — Emergency Department (HOSPITAL_COMMUNITY)
Admission: EM | Admit: 2017-06-28 | Discharge: 2017-06-28 | Disposition: A | Discharge status: Home / Self Care | Payer: Medicare Other | Attending: Emergency Medicine | Admitting: Emergency Medicine

## 2017-06-28 ENCOUNTER — Other Ambulatory Visit: Payer: Self-pay

## 2017-06-28 DIAGNOSIS — K0889 Other specified disorders of teeth and supporting structures: Secondary | ICD-10-CM | POA: Insufficient documentation

## 2017-06-28 DIAGNOSIS — Z79899 Other long term (current) drug therapy: Secondary | ICD-10-CM | POA: Diagnosis not present

## 2017-06-28 MED ORDER — PENICILLIN V POTASSIUM 500 MG PO TABS: 500.0000 mg | ORAL_TABLET | Freq: Three times a day (TID) | ORAL | 0 refills | Status: AC

## 2017-06-28 MED ORDER — HYDROCODONE-ACETAMINOPHEN 5-325 MG PO TABS
1.0000 | ORAL_TABLET | Freq: Once | ORAL | Status: AC
  Administered 2017-06-28: 1 via ORAL
  Filled 2017-06-28: qty 1

## 2017-06-28 NOTE — ED Triage Notes (Signed)
Pt. Stated, last night all sudden my tooth started in pain left bottom.

## 2017-06-28 NOTE — Discharge Instructions (Signed)
You have a dental infection. It is very important that you get evaluated by a dentist as soon as possible. Call tomorrow to schedule an appointment. Continue over-the-counter pain medication as needed for pain. Take your full course of antibiotics. Read the instructions below.  Eat a soft or liquid diet and rinse your mouth out after meals with warm water. You should see a dentist or return here at once if you have increased swelling, increased pain or uncontrolled bleeding from the site of your injury.  SEEK MEDICAL CARE IF:  You have swelling around your tooth, in your face or neck.  You have bleeding which starts, continues, or gets worse.  You have a fever >101 If you are unable to open your mouth New or worsening symptoms develop, any additional concerns.
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
## 2017-06-29 ENCOUNTER — Telehealth: Payer: Self-pay | Admitting: *Deleted

## 2017-06-29 NOTE — Telephone Encounter (Signed)
SUBMITTED LIDOCAINE 5%  KEY: Clarita Crane

## 2017-07-01 ENCOUNTER — Encounter: Payer: Self-pay | Admitting: Family Medicine

## 2017-07-14 DIAGNOSIS — M542 Cervicalgia: Secondary | ICD-10-CM | POA: Diagnosis not present

## 2017-07-31 ENCOUNTER — Ambulatory Visit: Payer: Medicare Other | Admitting: Family Medicine

## 2017-08-03 DIAGNOSIS — M542 Cervicalgia: Secondary | ICD-10-CM | POA: Diagnosis not present

## 2017-08-16 ENCOUNTER — Other Ambulatory Visit: Payer: Self-pay

## 2017-08-16 ENCOUNTER — Encounter (HOSPITAL_COMMUNITY): Payer: Self-pay | Admitting: Emergency Medicine

## 2017-08-16 ENCOUNTER — Ambulatory Visit (HOSPITAL_COMMUNITY)
Admission: EM | Admit: 2017-08-16 | Discharge: 2017-08-16 | Disposition: A | Discharge status: Home / Self Care | Payer: Medicare Other | Attending: Family Medicine | Admitting: Family Medicine

## 2017-08-16 DIAGNOSIS — M79642 Pain in left hand: Secondary | ICD-10-CM | POA: Diagnosis not present

## 2017-08-16 DIAGNOSIS — M7989 Other specified soft tissue disorders: Secondary | ICD-10-CM | POA: Diagnosis not present

## 2017-08-16 DIAGNOSIS — Z79899 Other long term (current) drug therapy: Secondary | ICD-10-CM | POA: Diagnosis not present

## 2017-08-16 DIAGNOSIS — M79641 Pain in right hand: Secondary | ICD-10-CM | POA: Insufficient documentation

## 2017-08-16 LAB — SEDIMENTATION RATE: Sed Rate: 42 mm/hr — ABNORMAL HIGH (ref 0–22)

## 2017-08-16 LAB — C-REACTIVE PROTEIN

## 2017-08-16 NOTE — ED Triage Notes (Signed)
The patient presented to the [facility] with a complaint of bilateral hand pain and swelling x 1 month.

## 2017-08-16 NOTE — Discharge Instructions (Signed)
Take Celebrex as directed Restrict the salt in your diet I will notify you of your test results Follow-up with your primary care doctor at [facility]
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
## 2017-08-17 ENCOUNTER — Other Ambulatory Visit: Payer: Self-pay | Admitting: Family Medicine

## 2017-08-17 LAB — RHEUMATOID FACTOR: Rhuematoid fact SerPl-aCnc: <10 IU/mL (ref 0.0–13.9)

## 2017-08-18 ENCOUNTER — Encounter (HOSPITAL_COMMUNITY): Payer: Self-pay | Admitting: Family Medicine

## 2017-08-18 NOTE — Telephone Encounter (Signed)
LOV 06/23/17 Dr. Pamella Pert Medications are not on medication list.

## 2017-08-19 LAB — ANTINUCLEAR ANTIBODIES, IFA: ANA Ab, IFA: NEGATIVE

## 2017-08-23 NOTE — Telephone Encounter (Signed)
Refilling meds. Patient transitioned from ibu to celebrex and flexeril to baclofen during  last visit in may.

## 2017-09-03 ENCOUNTER — Other Ambulatory Visit: Payer: Self-pay

## 2017-09-03 ENCOUNTER — Emergency Department (HOSPITAL_COMMUNITY)
Admission: EM | Admit: 2017-09-03 | Discharge: 2017-09-03 | Disposition: A | Discharge status: Home / Self Care | Payer: Medicare Other | Attending: Emergency Medicine | Admitting: Emergency Medicine

## 2017-09-03 ENCOUNTER — Encounter (HOSPITAL_COMMUNITY): Payer: Self-pay

## 2017-09-03 DIAGNOSIS — T887XXA Unspecified adverse effect of drug or medicament, initial encounter: Secondary | ICD-10-CM | POA: Insufficient documentation

## 2017-09-03 DIAGNOSIS — R6889 Other general symptoms and signs: Secondary | ICD-10-CM | POA: Insufficient documentation

## 2017-09-03 DIAGNOSIS — T368X5A Adverse effect of other systemic antibiotics, initial encounter: Secondary | ICD-10-CM | POA: Diagnosis not present

## 2017-09-03 DIAGNOSIS — R410 Disorientation, unspecified: Secondary | ICD-10-CM | POA: Diagnosis not present

## 2017-09-03 DIAGNOSIS — T50905A Adverse effect of unspecified drugs, medicaments and biological substances, initial encounter: Secondary | ICD-10-CM

## 2017-09-03 DIAGNOSIS — B20 Human immunodeficiency virus [HIV] disease: Secondary | ICD-10-CM | POA: Diagnosis not present

## 2017-09-03 DIAGNOSIS — Z79899 Other long term (current) drug therapy: Secondary | ICD-10-CM | POA: Diagnosis not present

## 2017-09-03 DIAGNOSIS — Y69 Unspecified misadventure during surgical and medical care: Secondary | ICD-10-CM | POA: Insufficient documentation

## 2017-09-03 LAB — CBC WITH DIFFERENTIAL/PLATELET
Abs Immature Granulocytes: 0 10*3/uL (ref 0.0–0.1)
Basophils Absolute: 0.1 10*3/uL (ref 0.0–0.1)
Basophils Relative: 1 %
Eosinophils Absolute: 0.2 10*3/uL (ref 0.0–0.7)
Eosinophils Relative: 3 %
HCT: 42.4 % (ref 36.0–46.0)
Hemoglobin: 13.5 g/dL (ref 12.0–15.0)
Immature Granulocytes: 0 %
Lymphocytes Relative: 36 %
Lymphs Abs: 2 10*3/uL (ref 0.7–4.0)
MCH: 32.1 pg (ref 26.0–34.0)
MCHC: 31.8 g/dL (ref 30.0–36.0)
MCV: 101 fL — ABNORMAL HIGH (ref 78.0–100.0)
Monocytes Absolute: 0.3 10*3/uL (ref 0.1–1.0)
Monocytes Relative: 6 %
Neutro Abs: 2.9 10*3/uL (ref 1.7–7.7)
Neutrophils Relative %: 54 %
Platelets: 293 10*3/uL (ref 150–400)
RBC: 4.2 MIL/uL (ref 3.87–5.11)
RDW: 11.8 % (ref 11.5–15.5)
WBC: 5.4 10*3/uL (ref 4.0–10.5)

## 2017-09-03 LAB — BASIC METABOLIC PANEL
Anion gap: 10 (ref 5–15)
BUN: 10 mg/dL (ref 6–20)
CO2: 25 mmol/L (ref 22–32)
Calcium: 9.4 mg/dL (ref 8.9–10.3)
Chloride: 104 mmol/L (ref 98–111)
Creatinine, Ser: 1.16 mg/dL — ABNORMAL HIGH (ref 0.44–1.00)
GFR calc Af Amer: >60 mL/min (ref >60)
GFR calc non Af Amer: 59 mL/min — ABNORMAL LOW (ref >60)
Glucose, Bld: 109 mg/dL — ABNORMAL HIGH (ref 70–99)
Potassium: 4.2 mmol/L (ref 3.5–5.1)
Sodium: 139 mmol/L (ref 135–145)

## 2017-09-03 NOTE — Discharge Instructions (Addendum)
Stop baclofen

## 2017-09-03 NOTE — ED Notes (Signed)
Patient Alert and oriented to baseline. Stable and ambulatory to baseline. Patient verbalized understanding of the discharge instructions.  Patient belongings were taken by the patient.   

## 2017-09-03 NOTE — ED Triage Notes (Signed)
Pt presents with L arm and leg pain, difficulty with thoughts x 2 days after beginning celecobix, clindamycin an dbaclofen.  Pt is a pt of pain clinic and was given 2 of the meds for pain and the abx for tooth abscess.  Pt reports areas are painful.

## 2017-09-03 NOTE — ED Provider Notes (Signed)
MSE was initiated and I personally evaluated the patient and placed orders (if any) at  4:27 PM on September 03, 2017.  The patient appears stable so that the remainder of the MSE may be completed by another provider.  Patient placed in Quick Look pathway, seen and evaluated   Chief Complaint: Medication reaction  HPI:   37 year old female with a past medical history of bipolar disorder, HIV, substance abuse who presents to ED for evaluation of medication reaction.  She was started on clindamycin for a dental abscess, baclofen and celecoxib by her PCP on Tuesday.  States that she took all 3 of the medications for the first time on Tuesday.  This was the only time that she took it.  She feels that she is having reaction to the medications because she is "jumbling up my words, not thinking straight, having weird dreams."  She states that she had a dream last night causing the entire left side of her body to feel like it is "on fire."  No prior history of similar symptoms.  ROS: Changes in thought  Physical Exam:   Gen: No distress  Neuro: Awake and Alert  Skin: Warm    Focused Exam: Alert and oriented x3.  Patient rambling. NAD.  No objective signs of allergic reaction present, including no rash, signs of angioedema or anaphylaxis.   Initiation of care has begun. The patient has been counseled on the process, plan, and necessity for staying for the completion/evaluation, and the remainder of the medical screening examination    Delia Heady, PA-C 09/03/17 1629    Isla Pence, MD 09/03/17 1733

## 2017-09-12 ENCOUNTER — Ambulatory Visit (HOSPITAL_COMMUNITY)
Admission: EM | Admit: 2017-09-12 | Discharge: 2017-09-12 | Disposition: A | Discharge status: Home / Self Care | Payer: Medicare Other | Attending: Family Medicine | Admitting: Family Medicine

## 2017-09-12 ENCOUNTER — Encounter (HOSPITAL_COMMUNITY): Payer: Self-pay | Admitting: Emergency Medicine

## 2017-09-12 DIAGNOSIS — K0889 Other specified disorders of teeth and supporting structures: Secondary | ICD-10-CM | POA: Diagnosis not present

## 2017-09-12 MED ORDER — HYDROCODONE-ACETAMINOPHEN 5-325 MG PO TABS: 1.0000 | ORAL_TABLET | Freq: Four times a day (QID) | ORAL | 0 refills | Status: DC | PRN

## 2017-09-12 MED ORDER — AMOXICILLIN 875 MG PO TABS: 875.0000 mg | ORAL_TABLET | Freq: Two times a day (BID) | ORAL | 0 refills | Status: AC

## 2017-09-12 NOTE — ED Triage Notes (Signed)
Pt c/o dental pain,was given clindamycin and states "it makes me feel really sick".

## 2017-09-12 NOTE — ED Notes (Signed)
Pt discharged by provider.

## 2017-09-12 NOTE — Discharge Instructions (Signed)
Be aware, pain medications may cause drowsiness. Please do not drive, operate heavy machinery or make important decisions while on this medication, it can cloud your judgement.  

## 2017-09-14 ENCOUNTER — Other Ambulatory Visit: Payer: Self-pay

## 2017-09-14 ENCOUNTER — Encounter: Payer: Self-pay | Admitting: Family Medicine

## 2017-09-14 ENCOUNTER — Ambulatory Visit (INDEPENDENT_AMBULATORY_CARE_PROVIDER_SITE_OTHER): Payer: Medicare Other | Admitting: Family Medicine

## 2017-09-14 VITALS — BP 100/67 | HR 77 | Temp 99.0°F | Ht 61.5 in | Wt 178.0 lb

## 2017-09-14 DIAGNOSIS — K0889 Other specified disorders of teeth and supporting structures: Secondary | ICD-10-CM | POA: Diagnosis not present

## 2017-09-14 DIAGNOSIS — M5010 Cervical disc disorder with radiculopathy, unspecified cervical region: Secondary | ICD-10-CM | POA: Diagnosis not present

## 2017-09-14 DIAGNOSIS — B20 Human immunodeficiency virus [HIV] disease: Secondary | ICD-10-CM

## 2017-09-14 DIAGNOSIS — T887XXA Unspecified adverse effect of drug or medicament, initial encounter: Secondary | ICD-10-CM | POA: Diagnosis not present
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
## 2017-09-14 NOTE — Patient Instructions (Signed)
     IF you received an x-ray today, you will receive an invoice from [facility]. Please contact [facility] at [phone] with questions or concerns regarding your invoice.   IF you received labwork today, you will receive an invoice from [facility]. Please contact [facility] at [phone] with questions or concerns regarding your invoice.   Our billing staff will not be able to assist you with questions regarding bills from these companies.  You will be contacted with the lab results as soon as they are available. The fastest way to get your results is to activate your My Chart account. Instructions are located on the last page of this paperwork. If you have not heard from us regarding the results in 2 weeks, please contact this office.     
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
## 2017-09-16 DIAGNOSIS — M5412 Radiculopathy, cervical region: Secondary | ICD-10-CM | POA: Diagnosis not present
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
## 2017-09-17 DIAGNOSIS — M5412 Radiculopathy, cervical region: Secondary | ICD-10-CM | POA: Diagnosis not present

## 2017-09-21 ENCOUNTER — Other Ambulatory Visit: Payer: Medicare Other

## 2017-09-21 DIAGNOSIS — Z79899 Other long term (current) drug therapy: Secondary | ICD-10-CM

## 2017-09-21 DIAGNOSIS — Z113 Encounter for screening for infections with a predominantly sexual mode of transmission: Secondary | ICD-10-CM

## 2017-09-21 DIAGNOSIS — B20 Human immunodeficiency virus [HIV] disease: Secondary | ICD-10-CM

## 2017-09-22 LAB — T-HELPER CELL (CD4) - (RCID CLINIC ONLY)
CD4 % Helper T Cell: 48 % (ref 33–55)
CD4 T CELL ABS: 1140 /uL (ref 400–2700)

## 2017-09-23 LAB — CBC WITH DIFFERENTIAL/PLATELET
BASOS PCT: 0.6 %
Basophils Absolute: 49 cells/uL (ref 0–200)
EOS PCT: 4.3 %
Eosinophils Absolute: 353 cells/uL (ref 15–500)
HCT: 36 % (ref 35.0–45.0)
Hemoglobin: 12.2 g/dL (ref 11.7–15.5)
LYMPHS ABS: 2403 {cells}/uL (ref 850–3900)
MCH: 32.3 pg (ref 27.0–33.0)
MCHC: 33.9 g/dL (ref 32.0–36.0)
MCV: 95.2 fL (ref 80.0–100.0)
MPV: 9.9 fL (ref 7.5–12.5)
Monocytes Relative: 6 %
NEUTROS PCT: 59.8 %
Neutro Abs: 4904 cells/uL (ref 1500–7800)
PLATELETS: 384 10*3/uL (ref 140–400)
RBC: 3.78 10*6/uL — AB (ref 3.80–5.10)
RDW: 12.9 % (ref 11.0–15.0)
TOTAL LYMPHOCYTE: 29.3 %
WBC: 8.2 10*3/uL (ref 3.8–10.8)
WBCMIX: 492 {cells}/uL (ref 200–950)

## 2017-09-23 LAB — COMPLETE METABOLIC PANEL WITH GFR
AG RATIO: 1.5 (calc) (ref 1.0–2.5)
ALT: 12 U/L (ref 6–29)
AST: 15 U/L (ref 10–30)
Albumin: 4.1 g/dL (ref 3.6–5.1)
Alkaline phosphatase (APISO): 53 U/L (ref 33–115)
BUN: 7 mg/dL (ref 7–25)
CALCIUM: 9.6 mg/dL (ref 8.6–10.2)
CO2: 25 mmol/L (ref 20–32)
CREATININE: 1.04 mg/dL (ref 0.50–1.10)
Chloride: 107 mmol/L (ref 98–110)
GFR, EST AFRICAN AMERICAN: 78 mL/min/{1.73_m2} (ref >=60)
GFR, EST NON AFRICAN AMERICAN: 68 mL/min/{1.73_m2} (ref >=60)
Globulin: 2.7 g/dL (calc) (ref 1.9–3.7)
Glucose, Bld: 119 mg/dL — ABNORMAL HIGH (ref 65–99)
Potassium: 4.3 mmol/L (ref 3.5–5.3)
Sodium: 139 mmol/L (ref 135–146)
TOTAL PROTEIN: 6.8 g/dL (ref 6.1–8.1)
Total Bilirubin: 0.3 mg/dL (ref 0.2–1.2)

## 2017-09-23 LAB — LIPID PANEL
Cholesterol: 210 mg/dL — ABNORMAL HIGH (ref <200)
HDL: 34 mg/dL — ABNORMAL LOW (ref >50)
NON-HDL CHOLESTEROL (CALC): 176 mg/dL — AB (ref <130)
Total CHOL/HDL Ratio: 6.2 (calc) — ABNORMAL HIGH (ref <5.0)
Triglycerides: 413 mg/dL — ABNORMAL HIGH (ref <150)

## 2017-09-23 LAB — HIV-1 RNA QUANT-NO REFLEX-BLD
HIV 1 RNA QUANT: NOT DETECTED {copies}/mL
HIV-1 RNA Quant, Log: <1.3 Log copies/mL

## 2017-09-23 LAB — RPR: RPR: NONREACTIVE

## 2017-09-29 ENCOUNTER — Encounter (HOSPITAL_COMMUNITY): Payer: Self-pay

## 2017-09-29 ENCOUNTER — Other Ambulatory Visit: Payer: Self-pay

## 2017-09-29 ENCOUNTER — Ambulatory Visit (HOSPITAL_COMMUNITY)
Admission: EM | Admit: 2017-09-29 | Discharge: 2017-09-29 | Disposition: A | Discharge status: Home / Self Care | Payer: Medicare Other | Attending: Family Medicine | Admitting: Family Medicine

## 2017-09-29 DIAGNOSIS — K047 Periapical abscess without sinus: Secondary | ICD-10-CM

## 2017-09-29 MED ORDER — AMOXICILLIN 875 MG PO TABS: 875.0000 mg | ORAL_TABLET | Freq: Two times a day (BID) | ORAL | 1 refills | Status: DC

## 2017-09-29 MED ORDER — HYDROCODONE-ACETAMINOPHEN 5-325 MG PO TABS: 1.0000 | ORAL_TABLET | Freq: Four times a day (QID) | ORAL | 0 refills | Status: DC | PRN

## 2017-09-29 NOTE — Discharge Instructions (Signed)
Take the antibiotic as directed.  Repeat if needed Take pain medication for severe pain.  Caution drowsiness. Follow-up with your oral surgeon as scheduled

## 2017-09-29 NOTE — ED Triage Notes (Signed)
Pt has an abscess 2 days , dental pain .Yolanda Jones
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
## 2017-10-01 ENCOUNTER — Encounter (HOSPITAL_COMMUNITY): Payer: Self-pay | Admitting: Emergency Medicine

## 2017-10-01 ENCOUNTER — Ambulatory Visit (HOSPITAL_COMMUNITY)
Admission: EM | Admit: 2017-10-01 | Discharge: 2017-10-01 | Disposition: A | Discharge status: Home / Self Care | Payer: Medicare Other | Attending: Family Medicine | Admitting: Family Medicine

## 2017-10-01 ENCOUNTER — Other Ambulatory Visit: Payer: Self-pay

## 2017-10-01 DIAGNOSIS — M542 Cervicalgia: Secondary | ICD-10-CM | POA: Diagnosis not present

## 2017-10-01 DIAGNOSIS — R030 Elevated blood-pressure reading, without diagnosis of hypertension: Secondary | ICD-10-CM | POA: Diagnosis not present

## 2017-10-01 DIAGNOSIS — M5412 Radiculopathy, cervical region: Secondary | ICD-10-CM | POA: Diagnosis not present

## 2017-10-01 DIAGNOSIS — K0889 Other specified disorders of teeth and supporting structures: Secondary | ICD-10-CM

## 2017-10-01 DIAGNOSIS — Z6828 Body mass index (BMI) 28.0-28.9, adult: Secondary | ICD-10-CM | POA: Diagnosis not present

## 2017-10-01 DIAGNOSIS — G501 Atypical facial pain: Secondary | ICD-10-CM | POA: Diagnosis not present

## 2017-10-01 DIAGNOSIS — K047 Periapical abscess without sinus: Secondary | ICD-10-CM | POA: Diagnosis not present

## 2017-10-01 DIAGNOSIS — Z6833 Body mass index (BMI) 33.0-33.9, adult: Secondary | ICD-10-CM | POA: Diagnosis not present

## 2017-10-01 MED ORDER — AMOXICILLIN-POT CLAVULANATE 875-125 MG PO TABS: 1.0000 | ORAL_TABLET | Freq: Two times a day (BID) | ORAL | 0 refills | Status: AC
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
## 2017-10-01 NOTE — Discharge Instructions (Signed)
Continue with naproxen twice a day, take with something on stomach.  Will switch antibiotic to a little more potent, may start with tonights dose so may discontinue previously prescribed.  Please call and get follow up appointment with your oral surgeon for definitive treatment of your tooth.

## 2017-10-01 NOTE — ED Triage Notes (Signed)
Bottom, right dental pain.  Patient has been seen recently for the same.

## 2017-10-05 ENCOUNTER — Encounter: Payer: Self-pay | Admitting: Internal Medicine

## 2017-10-05 ENCOUNTER — Ambulatory Visit (INDEPENDENT_AMBULATORY_CARE_PROVIDER_SITE_OTHER): Payer: Medicare Other | Admitting: Internal Medicine

## 2017-10-05 VITALS — BP 100/67 | HR 82 | Temp 98.0°F | Ht 61.0 in | Wt 171.0 lb

## 2017-10-05 DIAGNOSIS — Z79899 Other long term (current) drug therapy: Secondary | ICD-10-CM | POA: Diagnosis not present

## 2017-10-05 DIAGNOSIS — F900 Attention-deficit hyperactivity disorder, predominantly inattentive type: Secondary | ICD-10-CM | POA: Diagnosis not present

## 2017-10-05 DIAGNOSIS — Z7185 Encounter for immunization safety counseling: Secondary | ICD-10-CM | POA: Insufficient documentation

## 2017-10-05 DIAGNOSIS — F3175 Bipolar disorder, in partial remission, most recent episode depressed: Secondary | ICD-10-CM | POA: Diagnosis not present

## 2017-10-05 DIAGNOSIS — B20 Human immunodeficiency virus [HIV] disease: Secondary | ICD-10-CM | POA: Diagnosis not present

## 2017-10-05 DIAGNOSIS — Z7189 Other specified counseling: Secondary | ICD-10-CM | POA: Insufficient documentation

## 2017-10-05 NOTE — Assessment & Plan Note (Signed)
Doing well.  No issues and rtc 6 months.

## 2017-10-05 NOTE — Progress Notes (Signed)
  Subjective:    Patient ID: [name], female    DOB: Oct 17, 1980, 37 y.o.   MRN: [id number]  HPI She comes in for followup of her HIV.  She continues on Genvoya.  She does see psychiatry and has a primary physician.  No missed doses.  Uses condoms always.   Currently having significant neck pain and has an oral abscess on Augmentin.   CD4 1140, viral load < 20.     Review of Systems  Constitutional: Negative for fatigue.  HENT: Negative for sore throat and trouble swallowing.        Dry mouth  Gastrointestinal: Negative for diarrhea.  Skin: Negative for rash.  Neurological: Negative for dizziness and headaches.  Psychiatric/Behavioral: Negative for dysphoric mood. The patient is not hyperactive.        Objective:   Physical Exam  Constitutional: She appears well-developed and well-nourished.  HENT:  Mouth/Throat: No oropharyngeal exudate.  Eyes: Right eye exhibits no discharge. Left eye exhibits no discharge. No scleral icterus.  Cardiovascular: Normal rate, regular rhythm and normal heart sounds.  No murmur heard. Pulmonary/Chest: Effort normal and breath sounds normal. No respiratory distress.  Lymphadenopathy:    She has no cervical adenopathy.  Skin: No rash noted.   SH: no tobacco     Assessment & Plan:

## 2017-10-05 NOTE — Assessment & Plan Note (Signed)
Due for Prevnar and will discuss HPV vaccine next visit.  She wants to defer now due to current discomfort.

## 2017-10-05 NOTE — Assessment & Plan Note (Signed)
Some elevation in cholesterol.  Will defer management and further checks to her PCP

## 2017-10-07 ENCOUNTER — Other Ambulatory Visit: Payer: Self-pay

## 2017-10-07 ENCOUNTER — Encounter (HOSPITAL_COMMUNITY): Payer: Self-pay

## 2017-10-07 ENCOUNTER — Ambulatory Visit (HOSPITAL_COMMUNITY)
Admission: EM | Admit: 2017-10-07 | Discharge: 2017-10-07 | Disposition: A | Discharge status: Home / Self Care | Payer: Medicare Other | Attending: Family Medicine | Admitting: Family Medicine

## 2017-10-07 DIAGNOSIS — M545 Low back pain, unspecified: Secondary | ICD-10-CM

## 2017-10-07 DIAGNOSIS — F900 Attention-deficit hyperactivity disorder, predominantly inattentive type: Secondary | ICD-10-CM | POA: Diagnosis not present

## 2017-10-07 DIAGNOSIS — M25511 Pain in right shoulder: Secondary | ICD-10-CM | POA: Diagnosis not present

## 2017-10-07 DIAGNOSIS — J22 Unspecified acute lower respiratory infection: Secondary | ICD-10-CM | POA: Diagnosis not present

## 2017-10-07 DIAGNOSIS — S46811A Strain of other muscles, fascia and tendons at shoulder and upper arm level, right arm, initial encounter: Secondary | ICD-10-CM | POA: Diagnosis not present

## 2017-10-07 DIAGNOSIS — N911 Secondary amenorrhea: Secondary | ICD-10-CM | POA: Diagnosis not present

## 2017-10-07 DIAGNOSIS — F310 Bipolar disorder, current episode hypomanic: Secondary | ICD-10-CM | POA: Diagnosis not present

## 2017-10-07 MED ORDER — NAPROXEN 500 MG PO TABS: 500.0000 mg | ORAL_TABLET | Freq: Two times a day (BID) | ORAL | 0 refills | Status: DC

## 2017-10-07 MED ORDER — KETOROLAC TROMETHAMINE 60 MG/2ML IM SOLN
INTRAMUSCULAR | Status: AC
  Filled 2017-10-07: qty 2

## 2017-10-07 MED ORDER — KETOROLAC TROMETHAMINE 60 MG/2ML IM SOLN
60.0000 mg | Freq: Once | INTRAMUSCULAR | Status: AC
  Administered 2017-10-07: 60 mg via INTRAMUSCULAR
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
## 2017-10-07 NOTE — ED Notes (Signed)
Pt discharged by provider.

## 2017-10-07 NOTE — Discharge Instructions (Signed)

## 2017-10-07 NOTE — ED Triage Notes (Signed)
Pt saw the PCP this morning for her back pain. Pt state the PCP gave her a shot and did an x ray and she was also gave her flexeril. Pt states the meds are not working.

## 2017-10-08 DIAGNOSIS — M5412 Radiculopathy, cervical region: Secondary | ICD-10-CM | POA: Diagnosis not present

## 2017-10-08 DIAGNOSIS — S46811D Strain of other muscles, fascia and tendons at shoulder and upper arm level, right arm, subsequent encounter: Secondary | ICD-10-CM | POA: Diagnosis not present

## 2017-10-08 DIAGNOSIS — M549 Dorsalgia, unspecified: Secondary | ICD-10-CM | POA: Diagnosis not present

## 2017-10-14 ENCOUNTER — Telehealth: Payer: Self-pay | Admitting: *Deleted

## 2017-10-14 NOTE — Telephone Encounter (Signed)
Received a patient-signed release of information in triage from [facility]. This is asking for most recent labs only for continuity of care. However, there is no authorization selection for information related to HIV. RN left message at Center asking for this to be updated with a selection one way or another. Original request placed in triage. Landis Gandy, RN
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
## 2017-10-20 DIAGNOSIS — E538 Deficiency of other specified B group vitamins: Secondary | ICD-10-CM | POA: Diagnosis not present

## 2017-10-20 DIAGNOSIS — Z79899 Other long term (current) drug therapy: Secondary | ICD-10-CM | POA: Diagnosis not present

## 2017-10-20 DIAGNOSIS — Z0000 Encounter for general adult medical examination without abnormal findings: Secondary | ICD-10-CM | POA: Diagnosis not present

## 2017-10-20 DIAGNOSIS — E559 Vitamin D deficiency, unspecified: Secondary | ICD-10-CM | POA: Diagnosis not present

## 2017-10-20 DIAGNOSIS — E221 Hyperprolactinemia: Secondary | ICD-10-CM | POA: Diagnosis not present

## 2017-10-20 DIAGNOSIS — N911 Secondary amenorrhea: Secondary | ICD-10-CM | POA: Diagnosis not present

## 2017-10-20 DIAGNOSIS — E7800 Pure hypercholesterolemia, unspecified: Secondary | ICD-10-CM | POA: Diagnosis not present

## 2017-10-20 DIAGNOSIS — R5383 Other fatigue: Secondary | ICD-10-CM | POA: Diagnosis not present

## 2017-10-20 DIAGNOSIS — M129 Arthropathy, unspecified: Secondary | ICD-10-CM | POA: Diagnosis not present

## 2017-10-20 DIAGNOSIS — R0602 Shortness of breath: Secondary | ICD-10-CM | POA: Diagnosis not present

## 2017-10-21 DIAGNOSIS — F900 Attention-deficit hyperactivity disorder, predominantly inattentive type: Secondary | ICD-10-CM | POA: Diagnosis not present

## 2017-10-21 DIAGNOSIS — F310 Bipolar disorder, current episode hypomanic: Secondary | ICD-10-CM | POA: Diagnosis not present

## 2017-10-26 ENCOUNTER — Telehealth: Payer: Self-pay

## 2017-10-26 NOTE — Telephone Encounter (Signed)
Called patient to remind her to make her f/u visit with Dr. Linus Salmons. Left patient generic message to call the office to schedule her 6 month f/u (around Feb 26,2020) Yolanda Jones S. LPN

## 2017-10-28 DIAGNOSIS — F900 Attention-deficit hyperactivity disorder, predominantly inattentive type: Secondary | ICD-10-CM | POA: Diagnosis not present

## 2017-10-28 DIAGNOSIS — F310 Bipolar disorder, current episode hypomanic: Secondary | ICD-10-CM | POA: Diagnosis not present

## 2017-11-02 ENCOUNTER — Other Ambulatory Visit: Payer: Self-pay | Admitting: Internal Medicine

## 2017-11-03 DIAGNOSIS — E559 Vitamin D deficiency, unspecified: Secondary | ICD-10-CM | POA: Diagnosis not present

## 2017-11-03 DIAGNOSIS — E7800 Pure hypercholesterolemia, unspecified: Secondary | ICD-10-CM | POA: Diagnosis not present

## 2017-11-03 DIAGNOSIS — D352 Benign neoplasm of pituitary gland: Secondary | ICD-10-CM | POA: Diagnosis not present

## 2017-11-03 DIAGNOSIS — M542 Cervicalgia: Secondary | ICD-10-CM | POA: Diagnosis not present

## 2017-11-04 DIAGNOSIS — F310 Bipolar disorder, current episode hypomanic: Secondary | ICD-10-CM | POA: Diagnosis not present

## 2017-11-04 DIAGNOSIS — F900 Attention-deficit hyperactivity disorder, predominantly inattentive type: Secondary | ICD-10-CM | POA: Diagnosis not present

## 2017-11-11 DIAGNOSIS — F310 Bipolar disorder, current episode hypomanic: Secondary | ICD-10-CM | POA: Diagnosis not present

## 2017-11-11 DIAGNOSIS — F900 Attention-deficit hyperactivity disorder, predominantly inattentive type: Secondary | ICD-10-CM | POA: Diagnosis not present

## 2017-11-16 ENCOUNTER — Other Ambulatory Visit: Payer: Self-pay

## 2017-11-16 ENCOUNTER — Encounter (HOSPITAL_COMMUNITY): Payer: Self-pay

## 2017-11-16 ENCOUNTER — Emergency Department (HOSPITAL_COMMUNITY)
Admission: EM | Admit: 2017-11-16 | Discharge: 2017-11-16 | Disposition: A | Discharge status: Home / Self Care | Payer: Medicare Other | Attending: Emergency Medicine | Admitting: Emergency Medicine

## 2017-11-16 DIAGNOSIS — Z79899 Other long term (current) drug therapy: Secondary | ICD-10-CM | POA: Insufficient documentation

## 2017-11-16 DIAGNOSIS — M545 Low back pain, unspecified: Secondary | ICD-10-CM

## 2017-11-16 MED ORDER — PREDNISONE 10 MG (21) PO TBPK: ORAL_TABLET | Freq: Every day | ORAL | 0 refills | Status: DC

## 2017-11-16 MED ORDER — HYDROCODONE-ACETAMINOPHEN 5-325 MG PO TABS: 1.0000 | ORAL_TABLET | Freq: Four times a day (QID) | ORAL | 0 refills | Status: DC | PRN

## 2017-11-16 NOTE — Discharge Instructions (Addendum)
Your evaluated in the emergency department for low back pain in the setting of no injury.  This is likely musculoskeletal or even disc related.  Your neurologic exam was normal at this time.  You should continue the ibuprofen and muscle relaxant.  We are adding a steroid taper along with some pain medicine to help you.  Please follow-up with your doctor for further evaluation.  Return if any concerns.
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
## 2017-11-16 NOTE — ED Triage Notes (Signed)
Pt endorses lower back pain that is ongoing for a few days. Hx of same and has had hx of similar episodes. No neuro sx or incontinence. Has been taking flexiril and otc pain medication at home.

## 2017-11-24 DIAGNOSIS — M5440 Lumbago with sciatica, unspecified side: Secondary | ICD-10-CM | POA: Diagnosis not present

## 2017-11-24 DIAGNOSIS — E7800 Pure hypercholesterolemia, unspecified: Secondary | ICD-10-CM | POA: Diagnosis not present

## 2017-11-24 DIAGNOSIS — Z09 Encounter for follow-up examination after completed treatment for conditions other than malignant neoplasm: Secondary | ICD-10-CM | POA: Diagnosis not present

## 2017-11-25 DIAGNOSIS — F900 Attention-deficit hyperactivity disorder, predominantly inattentive type: Secondary | ICD-10-CM | POA: Diagnosis not present

## 2017-11-25 DIAGNOSIS — F310 Bipolar disorder, current episode hypomanic: Secondary | ICD-10-CM | POA: Diagnosis not present

## 2017-11-27 ENCOUNTER — Other Ambulatory Visit: Payer: Self-pay

## 2017-11-27 ENCOUNTER — Encounter (HOSPITAL_COMMUNITY): Payer: Self-pay | Admitting: Emergency Medicine

## 2017-11-27 ENCOUNTER — Ambulatory Visit (HOSPITAL_COMMUNITY)
Admission: EM | Admit: 2017-11-27 | Discharge: 2017-11-27 | Disposition: A | Discharge status: Home / Self Care | Payer: Medicare Other

## 2017-11-27 DIAGNOSIS — Z6828 Body mass index (BMI) 28.0-28.9, adult: Secondary | ICD-10-CM | POA: Diagnosis not present

## 2017-11-27 DIAGNOSIS — M545 Low back pain, unspecified: Secondary | ICD-10-CM

## 2017-11-27 DIAGNOSIS — G8929 Other chronic pain: Secondary | ICD-10-CM | POA: Diagnosis not present

## 2017-11-27 DIAGNOSIS — R030 Elevated blood-pressure reading, without diagnosis of hypertension: Secondary | ICD-10-CM | POA: Diagnosis not present

## 2017-11-27 MED ORDER — KETOROLAC TROMETHAMINE 60 MG/2ML IM SOLN
INTRAMUSCULAR | Status: AC
  Filled 2017-11-27: qty 2

## 2017-11-27 MED ORDER — KETOROLAC TROMETHAMINE 60 MG/2ML IM SOLN
60.0000 mg | Freq: Once | INTRAMUSCULAR | Status: AC
  Administered 2017-11-27: 60 mg via INTRAMUSCULAR

## 2017-11-27 NOTE — ED Triage Notes (Signed)
Patient states that her back has been hurting since the beginning of this month. She states that will be getting injections in her back next month and they did an MRI and found that she has a chipped disc.  Triaged by Margart Sickles, SRN

## 2017-11-27 NOTE — Discharge Instructions (Addendum)
Toradol injection given in clinic Based on the amount of medication that you are already taking and your allergy list will not be able to prescribe any medications today Continue taking her daily meds and follow-up as scheduled for your injection
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
## 2017-12-02 ENCOUNTER — Other Ambulatory Visit: Payer: Self-pay | Admitting: Internal Medicine

## 2017-12-02 DIAGNOSIS — F310 Bipolar disorder, current episode hypomanic: Secondary | ICD-10-CM | POA: Diagnosis not present

## 2017-12-02 DIAGNOSIS — F900 Attention-deficit hyperactivity disorder, predominantly inattentive type: Secondary | ICD-10-CM | POA: Diagnosis not present

## 2017-12-09 DIAGNOSIS — F310 Bipolar disorder, current episode hypomanic: Secondary | ICD-10-CM | POA: Diagnosis not present

## 2017-12-09 DIAGNOSIS — F900 Attention-deficit hyperactivity disorder, predominantly inattentive type: Secondary | ICD-10-CM | POA: Diagnosis not present

## 2017-12-16 DIAGNOSIS — F900 Attention-deficit hyperactivity disorder, predominantly inattentive type: Secondary | ICD-10-CM | POA: Diagnosis not present

## 2017-12-16 DIAGNOSIS — F310 Bipolar disorder, current episode hypomanic: Secondary | ICD-10-CM | POA: Diagnosis not present

## 2017-12-20 ENCOUNTER — Encounter (HOSPITAL_COMMUNITY): Payer: Self-pay | Admitting: Oncology

## 2017-12-20 ENCOUNTER — Emergency Department (HOSPITAL_COMMUNITY)
Admission: EM | Admit: 2017-12-20 | Discharge: 2017-12-20 | Disposition: A | Discharge status: Home / Self Care | Payer: Medicare Other | Attending: Emergency Medicine | Admitting: Emergency Medicine

## 2017-12-20 ENCOUNTER — Other Ambulatory Visit: Payer: Self-pay

## 2017-12-20 DIAGNOSIS — Z79899 Other long term (current) drug therapy: Secondary | ICD-10-CM | POA: Insufficient documentation

## 2017-12-20 DIAGNOSIS — S46811D Strain of other muscles, fascia and tendons at shoulder and upper arm level, right arm, subsequent encounter: Secondary | ICD-10-CM | POA: Diagnosis not present

## 2017-12-20 DIAGNOSIS — E559 Vitamin D deficiency, unspecified: Secondary | ICD-10-CM | POA: Diagnosis not present

## 2017-12-20 DIAGNOSIS — M5412 Radiculopathy, cervical region: Secondary | ICD-10-CM | POA: Diagnosis not present

## 2017-12-20 DIAGNOSIS — M542 Cervicalgia: Secondary | ICD-10-CM | POA: Diagnosis present

## 2017-12-20 DIAGNOSIS — Z23 Encounter for immunization: Secondary | ICD-10-CM | POA: Diagnosis not present

## 2017-12-20 DIAGNOSIS — M25511 Pain in right shoulder: Secondary | ICD-10-CM | POA: Diagnosis not present

## 2017-12-20 DIAGNOSIS — E7800 Pure hypercholesterolemia, unspecified: Secondary | ICD-10-CM | POA: Diagnosis not present

## 2017-12-20 MED ORDER — OXYCODONE-ACETAMINOPHEN 5-325 MG PO TABS
1.0000 | ORAL_TABLET | Freq: Once | ORAL | Status: AC
  Administered 2017-12-20: 1 via ORAL
  Filled 2017-12-20: qty 1

## 2017-12-20 MED ORDER — PREDNISONE 20 MG PO TABS: ORAL_TABLET | ORAL | 0 refills | Status: DC

## 2017-12-20 NOTE — ED Triage Notes (Signed)
Pt presents for chronic neck pain x one year.  Pt reports that she is here to get something for the pain.  States home medications are not effective.
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
## 2017-12-20 NOTE — ED Notes (Signed)
Pt ambulatory, stable, and verbalizes understanding of d/c instructions. 

## 2017-12-23 DIAGNOSIS — F310 Bipolar disorder, current episode hypomanic: Secondary | ICD-10-CM | POA: Diagnosis not present

## 2017-12-23 DIAGNOSIS — F900 Attention-deficit hyperactivity disorder, predominantly inattentive type: Secondary | ICD-10-CM | POA: Diagnosis not present

## 2017-12-25 DIAGNOSIS — Z130 Encounter for screening for diseases of the blood and blood-forming organs and certain disorders involving the immune mechanism: Secondary | ICD-10-CM | POA: Diagnosis not present

## 2017-12-25 DIAGNOSIS — Z6833 Body mass index (BMI) 33.0-33.9, adult: Secondary | ICD-10-CM | POA: Diagnosis not present

## 2017-12-25 DIAGNOSIS — Z01411 Encounter for gynecological examination (general) (routine) with abnormal findings: Secondary | ICD-10-CM | POA: Diagnosis not present

## 2017-12-25 DIAGNOSIS — N854 Malposition of uterus: Secondary | ICD-10-CM | POA: Diagnosis not present

## 2017-12-25 DIAGNOSIS — D352 Benign neoplasm of pituitary gland: Secondary | ICD-10-CM | POA: Diagnosis not present

## 2017-12-25 DIAGNOSIS — R87610 Atypical squamous cells of undetermined significance on cytologic smear of cervix (ASC-US): Secondary | ICD-10-CM | POA: Diagnosis not present

## 2017-12-25 DIAGNOSIS — Z30431 Encounter for routine checking of intrauterine contraceptive device: Secondary | ICD-10-CM | POA: Diagnosis not present

## 2017-12-25 DIAGNOSIS — Z13228 Encounter for screening for other metabolic disorders: Secondary | ICD-10-CM | POA: Diagnosis not present

## 2017-12-25 DIAGNOSIS — Z113 Encounter for screening for infections with a predominantly sexual mode of transmission: Secondary | ICD-10-CM | POA: Diagnosis not present

## 2017-12-31 DIAGNOSIS — J4520 Mild intermittent asthma, uncomplicated: Secondary | ICD-10-CM | POA: Diagnosis not present

## 2017-12-31 DIAGNOSIS — J40 Bronchitis, not specified as acute or chronic: Secondary | ICD-10-CM | POA: Diagnosis not present

## 2017-12-31 DIAGNOSIS — R509 Fever, unspecified: Secondary | ICD-10-CM | POA: Diagnosis not present

## 2018-01-06 DIAGNOSIS — F310 Bipolar disorder, current episode hypomanic: Secondary | ICD-10-CM | POA: Diagnosis not present

## 2018-01-06 DIAGNOSIS — F900 Attention-deficit hyperactivity disorder, predominantly inattentive type: Secondary | ICD-10-CM | POA: Diagnosis not present

## 2018-01-13 DIAGNOSIS — F310 Bipolar disorder, current episode hypomanic: Secondary | ICD-10-CM | POA: Diagnosis not present

## 2018-01-13 DIAGNOSIS — F900 Attention-deficit hyperactivity disorder, predominantly inattentive type: Secondary | ICD-10-CM | POA: Diagnosis not present

## 2018-01-20 DIAGNOSIS — F310 Bipolar disorder, current episode hypomanic: Secondary | ICD-10-CM | POA: Diagnosis not present

## 2018-01-20 DIAGNOSIS — F900 Attention-deficit hyperactivity disorder, predominantly inattentive type: Secondary | ICD-10-CM | POA: Diagnosis not present

## 2018-01-27 DIAGNOSIS — B20 Human immunodeficiency virus [HIV] disease: Secondary | ICD-10-CM | POA: Diagnosis not present

## 2018-01-27 DIAGNOSIS — F310 Bipolar disorder, current episode hypomanic: Secondary | ICD-10-CM | POA: Diagnosis not present

## 2018-01-27 DIAGNOSIS — Z6832 Body mass index (BMI) 32.0-32.9, adult: Secondary | ICD-10-CM | POA: Diagnosis not present

## 2018-01-27 DIAGNOSIS — D352 Benign neoplasm of pituitary gland: Secondary | ICD-10-CM | POA: Diagnosis not present

## 2018-01-27 DIAGNOSIS — F900 Attention-deficit hyperactivity disorder, predominantly inattentive type: Secondary | ICD-10-CM | POA: Diagnosis not present

## 2018-02-05 ENCOUNTER — Other Ambulatory Visit: Payer: Self-pay | Admitting: Internal Medicine

## 2018-02-05 DIAGNOSIS — D352 Benign neoplasm of pituitary gland: Secondary | ICD-10-CM

## 2018-02-11 DIAGNOSIS — F310 Bipolar disorder, current episode hypomanic: Secondary | ICD-10-CM | POA: Diagnosis not present

## 2018-02-11 DIAGNOSIS — F900 Attention-deficit hyperactivity disorder, predominantly inattentive type: Secondary | ICD-10-CM | POA: Diagnosis not present

## 2018-02-12 ENCOUNTER — Other Ambulatory Visit: Payer: Self-pay | Admitting: Internal Medicine

## 2018-02-16 DIAGNOSIS — F3175 Bipolar disorder, in partial remission, most recent episode depressed: Secondary | ICD-10-CM | POA: Diagnosis not present

## 2018-02-16 DIAGNOSIS — F900 Attention-deficit hyperactivity disorder, predominantly inattentive type: Secondary | ICD-10-CM | POA: Diagnosis not present

## 2018-02-19 ENCOUNTER — Ambulatory Visit
Admission: RE | Admit: 2018-02-19 | Discharge: 2018-02-19 | Disposition: A | Discharge status: Home / Self Care | Payer: Medicare Other | Source: Ambulatory Visit | Attending: Internal Medicine | Admitting: Internal Medicine

## 2018-02-19 DIAGNOSIS — D352 Benign neoplasm of pituitary gland: Secondary | ICD-10-CM

## 2018-02-19 DIAGNOSIS — E221 Hyperprolactinemia: Secondary | ICD-10-CM | POA: Diagnosis not present

## 2018-02-19 MED ORDER — GADOBENATE DIMEGLUMINE 529 MG/ML IV SOLN
8.0000 mL | Freq: Once | INTRAVENOUS | Status: AC | PRN
  Administered 2018-02-19: 8 mL via INTRAVENOUS

## 2018-02-21 ENCOUNTER — Other Ambulatory Visit: Payer: Medicare Other

## 2018-03-17 ENCOUNTER — Other Ambulatory Visit: Payer: Medicare Other

## 2018-03-17 ENCOUNTER — Other Ambulatory Visit: Payer: Self-pay | Admitting: *Deleted

## 2018-03-17 ENCOUNTER — Other Ambulatory Visit (HOSPITAL_COMMUNITY)
Admission: RE | Admit: 2018-03-17 | Discharge: 2018-03-17 | Disposition: A | Discharge status: Home / Self Care | Payer: Medicare Other | Source: Ambulatory Visit | Attending: Internal Medicine | Admitting: Internal Medicine

## 2018-03-17 DIAGNOSIS — B20 Human immunodeficiency virus [HIV] disease: Secondary | ICD-10-CM

## 2018-03-17 DIAGNOSIS — Z113 Encounter for screening for infections with a predominantly sexual mode of transmission: Secondary | ICD-10-CM | POA: Insufficient documentation

## 2018-03-18 LAB — URINE CYTOLOGY ANCILLARY ONLY
Chlamydia: NEGATIVE
Neisseria Gonorrhea: NEGATIVE

## 2018-03-18 LAB — T-HELPER CELL (CD4) - (RCID CLINIC ONLY)
CD4 % Helper T Cell: 45 % (ref 33–55)
CD4 T CELL ABS: 730 /uL (ref 400–2700)

## 2018-03-19 DIAGNOSIS — K219 Gastro-esophageal reflux disease without esophagitis: Secondary | ICD-10-CM | POA: Diagnosis not present

## 2018-03-19 LAB — CBC WITH DIFFERENTIAL/PLATELET
Absolute Monocytes: 314 cells/uL (ref 200–950)
BASOS ABS: 51 {cells}/uL (ref 0–200)
BASOS PCT: 0.9 %
EOS ABS: 359 {cells}/uL (ref 15–500)
Eosinophils Relative: 6.3 %
HCT: 37.6 % (ref 35.0–45.0)
Hemoglobin: 12.9 g/dL (ref 11.7–15.5)
LYMPHS ABS: 1596 {cells}/uL (ref 850–3900)
MCH: 33 pg (ref 27.0–33.0)
MCHC: 34.3 g/dL (ref 32.0–36.0)
MCV: 96.2 fL (ref 80.0–100.0)
MONOS PCT: 5.5 %
MPV: 10.1 fL (ref 7.5–12.5)
Neutro Abs: 3380 cells/uL (ref 1500–7800)
Neutrophils Relative %: 59.3 %
PLATELETS: 345 10*3/uL (ref 140–400)
RBC: 3.91 10*6/uL (ref 3.80–5.10)
RDW: 12.4 % (ref 11.0–15.0)
TOTAL LYMPHOCYTE: 28 %
WBC: 5.7 10*3/uL (ref 3.8–10.8)

## 2018-03-19 LAB — COMPLETE METABOLIC PANEL WITH GFR
AG Ratio: 1.4 (calc) (ref 1.0–2.5)
ALBUMIN MSPROF: 3.7 g/dL (ref 3.6–5.1)
ALT: 12 U/L (ref 6–29)
AST: 20 U/L (ref 10–30)
Alkaline phosphatase (APISO): 56 U/L (ref 31–125)
BUN / CREAT RATIO: 13 (calc) (ref 6–22)
BUN: 17 mg/dL (ref 7–25)
CHLORIDE: 107 mmol/L (ref 98–110)
CO2: 24 mmol/L (ref 20–32)
Calcium: 9.2 mg/dL (ref 8.6–10.2)
Creat: 1.33 mg/dL — ABNORMAL HIGH (ref 0.50–1.10)
GFR, EST AFRICAN AMERICAN: 58 mL/min/{1.73_m2} — AB (ref >=60)
GFR, EST NON AFRICAN AMERICAN: 50 mL/min/{1.73_m2} — AB (ref >=60)
GLUCOSE: 83 mg/dL (ref 65–99)
Globulin: 2.7 g/dL (calc) (ref 1.9–3.7)
POTASSIUM: 4.4 mmol/L (ref 3.5–5.3)
SODIUM: 139 mmol/L (ref 135–146)
TOTAL PROTEIN: 6.4 g/dL (ref 6.1–8.1)
Total Bilirubin: 0.3 mg/dL (ref 0.2–1.2)

## 2018-03-19 LAB — HIV-1 RNA QUANT-NO REFLEX-BLD
HIV 1 RNA QUANT: NOT DETECTED {copies}/mL
HIV-1 RNA Quant, Log: <1.3 Log copies/mL

## 2018-04-01 ENCOUNTER — Ambulatory Visit: Payer: Medicare Other | Admitting: Internal Medicine

## 2018-04-05 ENCOUNTER — Ambulatory Visit: Payer: Medicare Other | Admitting: Internal Medicine

## 2018-04-15 DIAGNOSIS — D352 Benign neoplasm of pituitary gland: Secondary | ICD-10-CM | POA: Diagnosis not present

## 2018-04-15 DIAGNOSIS — B20 Human immunodeficiency virus [HIV] disease: Secondary | ICD-10-CM | POA: Diagnosis not present

## 2018-04-15 DIAGNOSIS — Z6832 Body mass index (BMI) 32.0-32.9, adult: Secondary | ICD-10-CM | POA: Diagnosis not present

## 2018-04-19 DIAGNOSIS — Z79899 Other long term (current) drug therapy: Secondary | ICD-10-CM | POA: Diagnosis not present

## 2018-04-19 DIAGNOSIS — R1013 Epigastric pain: Secondary | ICD-10-CM | POA: Diagnosis not present

## 2018-04-19 DIAGNOSIS — K219 Gastro-esophageal reflux disease without esophagitis: Secondary | ICD-10-CM | POA: Diagnosis not present

## 2018-04-19 DIAGNOSIS — K760 Fatty (change of) liver, not elsewhere classified: Secondary | ICD-10-CM | POA: Diagnosis not present

## 2018-04-19 DIAGNOSIS — R110 Nausea: Secondary | ICD-10-CM | POA: Diagnosis not present

## 2018-04-19 DIAGNOSIS — E559 Vitamin D deficiency, unspecified: Secondary | ICD-10-CM | POA: Diagnosis not present

## 2018-04-26 ENCOUNTER — Ambulatory Visit (INDEPENDENT_AMBULATORY_CARE_PROVIDER_SITE_OTHER): Payer: Medicare Other | Admitting: Internal Medicine

## 2018-04-26 ENCOUNTER — Other Ambulatory Visit: Payer: Self-pay

## 2018-04-26 ENCOUNTER — Encounter: Payer: Self-pay | Admitting: Internal Medicine

## 2018-04-26 VITALS — BP 97/67 | HR 103 | Temp 98.2°F | Wt 166.0 lb

## 2018-04-26 DIAGNOSIS — B009 Herpesviral infection, unspecified: Secondary | ICD-10-CM

## 2018-04-26 DIAGNOSIS — B20 Human immunodeficiency virus [HIV] disease: Secondary | ICD-10-CM

## 2018-04-26 DIAGNOSIS — Z23 Encounter for immunization: Secondary | ICD-10-CM

## 2018-04-26 DIAGNOSIS — N182 Chronic kidney disease, stage 2 (mild): Secondary | ICD-10-CM | POA: Diagnosis not present

## 2018-04-26 MED ORDER — VALACYCLOVIR HCL 500 MG PO TABS: 500.0000 mg | ORAL_TABLET | Freq: Every day | ORAL | 11 refills | Status: DC

## 2018-04-26 NOTE — Assessment & Plan Note (Signed)
Remains mild

## 2018-04-26 NOTE — Assessment & Plan Note (Signed)
She will continue with suppressive Valtrex

## 2018-04-26 NOTE — Assessment & Plan Note (Signed)
Doing great, no changes.  rtc 6 months

## 2018-04-26 NOTE — Progress Notes (Signed)
  Subjective:    Patient ID: Yolanda Jones, female    DOB: [birth year], 38 y.o.   MRN: [id number]  HPI She comes in for followup of her HIV.  She continues on Genvoya.  She does see psychiatry and has a primary physician.  No missed doses.  Uses condoms always.   CD4 730, viral load < 20. Creat up mildly to 1.33. Some complaint of bloating occasionally, particularly with increased stressors.   Some increased swelling equally of hands bilaterally.    Review of Systems  Constitutional: Negative for fatigue.  HENT: Negative for sore throat and trouble swallowing.        Dry mouth  Gastrointestinal: Negative for diarrhea.  Skin: Negative for rash.  Neurological: Negative for dizziness and headaches.  Psychiatric/Behavioral: Negative for dysphoric mood. The patient is not hyperactive.        Objective:   Physical Exam  Constitutional: She appears well-developed and well-nourished.  Eyes: Right eye exhibits no discharge. Left eye exhibits no discharge. No scleral icterus.  Abdominal: She exhibits no distension.  Lymphadenopathy:    She has no cervical adenopathy.  Skin: No rash noted.  Psychiatric: Her behavior is normal. Thought content normal.  Some mild pressured speech but sensible, redirectable   SH: no tobacco     Assessment & Plan:

## 2018-04-27 NOTE — Addendum Note (Signed)
Addended by: Lenore Cordia on: 04/27/2018 08:56 AM   Modules accepted: Orders

## 2018-04-29 ENCOUNTER — Emergency Department (HOSPITAL_COMMUNITY)
Admission: EM | Admit: 2018-04-29 | Discharge: 2018-04-30 | Disposition: A | Discharge status: Home / Self Care | Payer: Medicare Other | Attending: Emergency Medicine | Admitting: Emergency Medicine

## 2018-04-29 ENCOUNTER — Encounter (HOSPITAL_COMMUNITY): Payer: Self-pay | Admitting: Emergency Medicine

## 2018-04-29 ENCOUNTER — Emergency Department (HOSPITAL_COMMUNITY): Payer: Medicare Other

## 2018-04-29 ENCOUNTER — Other Ambulatory Visit: Payer: Self-pay

## 2018-04-29 DIAGNOSIS — L089 Local infection of the skin and subcutaneous tissue, unspecified: Secondary | ICD-10-CM | POA: Diagnosis not present

## 2018-04-29 DIAGNOSIS — S199XXA Unspecified injury of neck, initial encounter: Secondary | ICD-10-CM | POA: Diagnosis not present

## 2018-04-29 DIAGNOSIS — S3991XA Unspecified injury of abdomen, initial encounter: Secondary | ICD-10-CM | POA: Diagnosis not present

## 2018-04-29 DIAGNOSIS — Y9389 Activity, other specified: Secondary | ICD-10-CM | POA: Diagnosis not present

## 2018-04-29 DIAGNOSIS — Y9289 Other specified places as the place of occurrence of the external cause: Secondary | ICD-10-CM | POA: Insufficient documentation

## 2018-04-29 DIAGNOSIS — S299XXA Unspecified injury of thorax, initial encounter: Secondary | ICD-10-CM | POA: Diagnosis not present

## 2018-04-29 DIAGNOSIS — W14XXXA Fall from tree, initial encounter: Secondary | ICD-10-CM | POA: Diagnosis not present

## 2018-04-29 DIAGNOSIS — S50811A Abrasion of right forearm, initial encounter: Secondary | ICD-10-CM | POA: Insufficient documentation

## 2018-04-29 DIAGNOSIS — Y998 Other external cause status: Secondary | ICD-10-CM | POA: Insufficient documentation

## 2018-04-29 DIAGNOSIS — R471 Dysarthria and anarthria: Secondary | ICD-10-CM | POA: Insufficient documentation

## 2018-04-29 DIAGNOSIS — R918 Other nonspecific abnormal finding of lung field: Secondary | ICD-10-CM | POA: Diagnosis not present

## 2018-04-29 DIAGNOSIS — S060X0A Concussion without loss of consciousness, initial encounter: Secondary | ICD-10-CM | POA: Diagnosis not present

## 2018-04-29 DIAGNOSIS — S0990XA Unspecified injury of head, initial encounter: Secondary | ICD-10-CM | POA: Diagnosis not present

## 2018-04-29 LAB — SAMPLE TO BLOOD BANK

## 2018-04-29 LAB — URINALYSIS, ROUTINE W REFLEX MICROSCOPIC
Bilirubin Urine: NEGATIVE
GLUCOSE, UA: NEGATIVE mg/dL
Hgb urine dipstick: NEGATIVE
Ketones, ur: NEGATIVE mg/dL
Leukocytes,Ua: NEGATIVE
Nitrite: NEGATIVE
Protein, ur: NEGATIVE mg/dL
Specific Gravity, Urine: 1.006 (ref 1.005–1.030)
pH: 8 (ref 5.0–8.0)

## 2018-04-29 LAB — CBC
HCT: 36.3 % (ref 36.0–46.0)
Hemoglobin: 12.4 g/dL (ref 12.0–15.0)
MCH: 32.2 pg (ref 26.0–34.0)
MCHC: 34.2 g/dL (ref 30.0–36.0)
MCV: 94.3 fL (ref 80.0–100.0)
Platelets: 274 10*3/uL (ref 150–400)
RBC: 3.85 MIL/uL — ABNORMAL LOW (ref 3.87–5.11)
RDW: 11.3 % — ABNORMAL LOW (ref 11.5–15.5)
WBC: 6.9 10*3/uL (ref 4.0–10.5)
nRBC: 0 % (ref 0.0–0.2)

## 2018-04-29 LAB — COMPREHENSIVE METABOLIC PANEL
ALT: 13 U/L (ref 0–44)
AST: 21 U/L (ref 15–41)
Albumin: 3.6 g/dL (ref 3.5–5.0)
Alkaline Phosphatase: 55 U/L (ref 38–126)
Anion gap: 7 (ref 5–15)
BILIRUBIN TOTAL: 0.6 mg/dL (ref 0.3–1.2)
BUN: 12 mg/dL (ref 6–20)
CO2: 24 mmol/L (ref 22–32)
Calcium: 9.7 mg/dL (ref 8.9–10.3)
Chloride: 104 mmol/L (ref 98–111)
Creatinine, Ser: 1.23 mg/dL — ABNORMAL HIGH (ref 0.44–1.00)
GFR calc Af Amer: >60 mL/min (ref >60)
GFR calc non Af Amer: 55 mL/min — ABNORMAL LOW (ref >60)
Glucose, Bld: 84 mg/dL (ref 70–99)
POTASSIUM: 3.7 mmol/L (ref 3.5–5.1)
Sodium: 135 mmol/L (ref 135–145)
TOTAL PROTEIN: 6.4 g/dL — AB (ref 6.5–8.1)

## 2018-04-29 LAB — PROTIME-INR
INR: 1 (ref 0.8–1.2)
Prothrombin Time: 12.9 seconds (ref 11.4–15.2)

## 2018-04-29 LAB — RAPID URINE DRUG SCREEN, HOSP PERFORMED
Amphetamines: NOT DETECTED
Barbiturates: NOT DETECTED
Benzodiazepines: POSITIVE — AB
Cocaine: NOT DETECTED
Opiates: NOT DETECTED
Tetrahydrocannabinol: NOT DETECTED

## 2018-04-29 LAB — ETHANOL: Alcohol, Ethyl (B): <10 mg/dL (ref <10)

## 2018-04-29 LAB — I-STAT BETA HCG BLOOD, ED (MC, WL, AP ONLY): I-stat hCG, quantitative: <5 m[IU]/mL (ref <5)

## 2018-04-29 LAB — LACTIC ACID, PLASMA: Lactic Acid, Venous: 1 mmol/L (ref 0.5–1.9)

## 2018-04-29 MED ORDER — SODIUM CHLORIDE 0.9 % IV SOLN
INTRAVENOUS | Status: AC | PRN
  Administered 2018-04-29: 100 mL/h via INTRAVENOUS

## 2018-04-29 MED ORDER — ONDANSETRON HCL 4 MG/2ML IJ SOLN
4.0000 mg | Freq: Once | INTRAMUSCULAR | Status: AC
  Administered 2018-04-29: 4 mg via INTRAVENOUS
  Filled 2018-04-29: qty 2

## 2018-04-29 MED ORDER — FENTANYL CITRATE (PF) 100 MCG/2ML IJ SOLN
25.0000 ug | Freq: Once | INTRAMUSCULAR | Status: AC
  Administered 2018-04-29: 25 ug via INTRAVENOUS
  Filled 2018-04-29: qty 2

## 2018-04-29 MED ORDER — IOHEXOL 300 MG/ML  SOLN
100.0000 mL | Freq: Once | INTRAMUSCULAR | Status: AC | PRN
  Administered 2018-04-29: 100 mL via INTRAVENOUS

## 2018-04-29 NOTE — ED Notes (Signed)
1 attempt on blood draw. unsuccessful
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
## 2018-04-29 NOTE — ED Provider Notes (Signed)
[facility] EMERGENCY DEPARTMENT Provider Note   CSN: [id number] Arrival date & time: 04/29/18  2019    History   Chief Complaint Chief Complaint  Patient presents with   Fall from a tree    Level2    HPI Yolanda Jones is a 38 y.o. female.     Patient is a 38 year old female who presents the emergency department as a level 2 trauma after falling from a tree.  Patient was reportedly hanging on a low lying tree limb approximately 5 to 6 feet off the ground when she lost her grip and fell backwards.  Point of contact was reported as the back of the head.  Upon EMS arrival to the scene she was hemodynamically stable however extremely confused with some dysarthria.  Patient had no obvious traumatic injuries with the exception of abrasion over the right upper extremity.  She was brought to this emergency department for evaluation and treatment of potential traumatic injuries.   Illness  Severity:  Severe Onset quality:  Sudden Duration:  1 hour Timing:  Constant Progression:  Unchanged Chronicity:  New   History reviewed. No pertinent past medical history.  There are no active problems to display for this patient.   History reviewed. No pertinent surgical history.   OB History   No obstetric history on file.      Home Medications    Prior to Admission medications   Not on File    Family History No family history on file.  Social History Social History   Tobacco Use   Smoking status: Unknown If Ever Smoked  Substance Use Topics   Alcohol use: Not on file   Drug use: Not on file     Allergies   Patient has no known allergies.   Review of Systems Review of Systems  Unable to perform ROS: Mental status change     Physical Exam Updated Vital Signs BP 103/87    Pulse 91    Temp (!) 97.4 F (36.3 C) (Temporal)    Resp 16    Ht 5\' 6"  (1.676 m)    Wt 72.6 kg    SpO2 99%    BMI 25.82 kg/m   Physical Exam Vitals signs and  nursing note reviewed.  Constitutional:      General: She is not in acute distress.    Appearance: She is well-developed.  HENT:     Head:     Comments: Head is atraumatic.  No palpable deformities or crepitus over the scalp or skull.  Negative battle sign.  Negative raccoon eyes. Eyes:     Conjunctiva/sclera: Conjunctivae normal.  Neck:     Comments: Patient in Aspen collar.  Mild tenderness palpation over cervical vertebrae. Cardiovascular:     Rate and Rhythm: Normal rate and regular rhythm.     Heart sounds: No murmur.  Pulmonary:     Effort: Pulmonary effort is normal. No respiratory distress.     Breath sounds: Normal breath sounds.  Abdominal:     Palpations: Abdomen is soft.     Tenderness: There is no abdominal tenderness.  Musculoskeletal:     Comments: No tenderness palpation over thoracic or lumbar spine.  There are no bony deformities or step-offs present. Abrasion overlying the right forearm. No bony deformities appreciated over the extremities.  Equal pulses throughout.  Compartments are soft.  All extremities are warm and well-perfused.  Skin:    General: Skin is warm and dry.  Neurological:  Mental Status: She is alert.     Comments: Patient is alert.  Orientation somewhat difficult to fully evaluate secondary to the patient's dysarthria.  She is able to verbalize the first portion of hospital when asked where she currently is.  She has repetitive intentional blinking as well as smacking of her mouth.  Moving all 4 extremities equally.      ED Treatments / Results  Labs (all labs ordered are listed, but only abnormal results are displayed) Labs Reviewed  COMPREHENSIVE METABOLIC PANEL - Abnormal; Notable for the following components:      Result Value   Creatinine, Ser 1.23 (*)    Total Protein 6.4 (*)    GFR calc non Af Amer 55 (*)    All other components within normal limits  CBC - Abnormal; Notable for the following components:   RBC 3.85 (*)    RDW  11.3 (*)    All other components within normal limits  URINALYSIS, ROUTINE W REFLEX MICROSCOPIC - Abnormal; Notable for the following components:   Color, Urine STRAW (*)    All other components within normal limits  RAPID URINE DRUG SCREEN, HOSP PERFORMED - Abnormal; Notable for the following components:   Benzodiazepines POSITIVE (*)    All other components within normal limits  ETHANOL  LACTIC ACID, PLASMA  PROTIME-INR  CDS SEROLOGY  I-STAT BETA HCG BLOOD, ED ([facility], [facility], [facility] ONLY)  SAMPLE TO BLOOD BANK    EKG None  Radiology Ct Head Wo Contrast  Result Date: 04/29/2018 CLINICAL DATA:  Fall from tree. Fall of approximately 6 feet. EXAM: CT HEAD WITHOUT CONTRAST CT CERVICAL SPINE WITHOUT CONTRAST TECHNIQUE: Multidetector CT imaging of the head and cervical spine was performed following the standard protocol without intravenous contrast. Multiplanar CT image reconstructions of the cervical spine were also generated. COMPARISON:  None. FINDINGS: CT HEAD FINDINGS Brain: There is no mass, hemorrhage or extra-axial collection. The size and configuration of the ventricles and extra-axial CSF spaces are normal. The brain parenchyma is normal, without evidence of acute or chronic infarction. Vascular: No abnormal hyperdensity of the major intracranial arteries or dural venous sinuses. No intracranial atherosclerosis. Skull: The visualized skull base, calvarium and extracranial soft tissues are normal. Sinuses/Orbits: No fluid levels or advanced mucosal thickening of the visualized paranasal sinuses. No mastoid or middle ear effusion. The orbits are normal. CT CERVICAL SPINE FINDINGS Alignment: No static subluxation. Facets are aligned. Occipital condyles are normally positioned. Skull base and vertebrae: No acute fracture. Soft tissues and spinal canal: No prevertebral fluid or swelling. No visible canal hematoma. Disc levels: No advanced spinal canal or neural foraminal stenosis. Upper chest: No  pneumothorax, pulmonary nodule or pleural effusion. Other: Normal visualized paraspinal cervical soft tissues. IMPRESSION: 1. Normal head CT. 2. No acute fracture or static subluxation of the cervical spine. Electronically Signed   By: Ulyses Jarred M.D.   On: 04/29/2018 21:59   Ct Chest W Contrast  Result Date: 04/29/2018 CLINICAL DATA:  Initial evaluation for acute trauma, fall. EXAM: CT CHEST, ABDOMEN, AND PELVIS WITH CONTRAST TECHNIQUE: Multidetector CT imaging of the chest, abdomen and pelvis was performed following the standard protocol during bolus administration of intravenous contrast. CONTRAST:  189mL OMNIPAQUE IOHEXOL 300 MG/ML  SOLN COMPARISON:  Prior radiograph from earlier same day. FINDINGS: CT CHEST FINDINGS Cardiovascular: Intrathoracic aorta of normal caliber and appearance without evidence for acute traumatic injury or aneurysm. Visualized great vessels within normal limits. Heart size normal. No pericardial effusion. Limited assessment of  the pulmonary arterial tree unremarkable. Mediastinum/Nodes: Subcentimeter hypodense left thyroid nodule noted, of doubtful significance. Visualized thyroid otherwise unremarkable. No enlarged mediastinal, hilar, or axillary lymph nodes. No mediastinal hematoma. Esophagus within normal limits. Lungs/Pleura: Tracheobronchial tree intact and patent. Lungs well inflated bilaterally. Scattered subsegmental atelectatic changes present within the dependent aspects of both lower lobes bilaterally. No focal consolidation to suggest pneumonia or pulmonary contusion. No pulmonary edema or pleural effusion. No pneumothorax. No worrisome pulmonary nodule or mass. Musculoskeletal: External soft tissues demonstrate no acute finding. No acute osseous abnormality. No discrete lytic or blastic osseous lesions. Remotely healed fracture of the right posterior fourth rib noted. CT ABDOMEN PELVIS FINDINGS Hepatobiliary: Liver intact and demonstrates a normal contrast enhanced  appearance. Gallbladder surgically absent. No biliary dilatation. Pancreas: Pancreas within normal limits. Spleen: Spleen intact without acute finding. 5 mm subcapsular cyst noted posteriorly. Adrenals/Urinary Tract: Adrenal glands are normal. Kidneys equal size with symmetric enhancement. Kidneys somewhat lobulated bilaterally with multifocal cortical scarring. No nephrolithiasis, hydronephrosis, or focal enhancing renal mass. Subcentimeter hypodensity at the lower pole the right kidney too small the characterize, but statistically likely reflects small cyst. No hydroureter. Bladder moderately distended without acute finding. Stomach/Bowel: Stomach within normal limits. No evidence for bowel obstruction or acute bowel injury. No acute inflammatory changes seen about the bowels. Vascular/Lymphatic: Normal intravascular enhancement seen throughout the intra-abdominal aorta and its branch vessels. No adenopathy. Reproductive: IUD in place within the uterus. Small bilateral adnexal cyst noted, of doubtful significance given size and patient age. Other: No free air or fluid. No mesenteric or retroperitoneal hematoma. Musculoskeletal: Mild hazy stranding within the subcutaneous fat of the lower mid back, which could reflect mild subcutaneous edema or possibly mild contusion (series 3, image 92). No frank hematoma. External soft tissues otherwise unremarkable. No acute osseous abnormality. No discrete lytic or blastic osseous lesions. IMPRESSION: 1. Mild hazy stranding within the subcutaneous fat along the mid lower back, which could reflect mild subcutaneous edema or contusion. Correlation with physical exam recommended. 2. No other acute traumatic injury within the chest, abdomen, and pelvis. 3. No other acute finding identified. Electronically Signed   By: Jeannine Boga M.D.   On: 04/29/2018 22:27   Ct Cervical Spine Wo Contrast  Result Date: 04/29/2018 CLINICAL DATA:  Fall from tree. Fall of approximately 6  feet. EXAM: CT HEAD WITHOUT CONTRAST CT CERVICAL SPINE WITHOUT CONTRAST TECHNIQUE: Multidetector CT imaging of the head and cervical spine was performed following the standard protocol without intravenous contrast. Multiplanar CT image reconstructions of the cervical spine were also generated. COMPARISON:  None. FINDINGS: CT HEAD FINDINGS Brain: There is no mass, hemorrhage or extra-axial collection. The size and configuration of the ventricles and extra-axial CSF spaces are normal. The brain parenchyma is normal, without evidence of acute or chronic infarction. Vascular: No abnormal hyperdensity of the major intracranial arteries or dural venous sinuses. No intracranial atherosclerosis. Skull: The visualized skull base, calvarium and extracranial soft tissues are normal. Sinuses/Orbits: No fluid levels or advanced mucosal thickening of the visualized paranasal sinuses. No mastoid or middle ear effusion. The orbits are normal. CT CERVICAL SPINE FINDINGS Alignment: No static subluxation. Facets are aligned. Occipital condyles are normally positioned. Skull base and vertebrae: No acute fracture. Soft tissues and spinal canal: No prevertebral fluid or swelling. No visible canal hematoma. Disc levels: No advanced spinal canal or neural foraminal stenosis. Upper chest: No pneumothorax, pulmonary nodule or pleural effusion. Other: Normal visualized paraspinal cervical soft tissues. IMPRESSION: 1. Normal head CT. 2.  No acute fracture or static subluxation of the cervical spine. Electronically Signed   By: Ulyses Jarred M.D.   On: 04/29/2018 21:59   Ct Abdomen Pelvis W Contrast  Result Date: 04/29/2018 CLINICAL DATA:  Initial evaluation for acute trauma, fall. EXAM: CT CHEST, ABDOMEN, AND PELVIS WITH CONTRAST TECHNIQUE: Multidetector CT imaging of the chest, abdomen and pelvis was performed following the standard protocol during bolus administration of intravenous contrast. CONTRAST:  120mL OMNIPAQUE IOHEXOL 300 MG/ML   SOLN COMPARISON:  Prior radiograph from earlier same day. FINDINGS: CT CHEST FINDINGS Cardiovascular: Intrathoracic aorta of normal caliber and appearance without evidence for acute traumatic injury or aneurysm. Visualized great vessels within normal limits. Heart size normal. No pericardial effusion. Limited assessment of the pulmonary arterial tree unremarkable. Mediastinum/Nodes: Subcentimeter hypodense left thyroid nodule noted, of doubtful significance. Visualized thyroid otherwise unremarkable. No enlarged mediastinal, hilar, or axillary lymph nodes. No mediastinal hematoma. Esophagus within normal limits. Lungs/Pleura: Tracheobronchial tree intact and patent. Lungs well inflated bilaterally. Scattered subsegmental atelectatic changes present within the dependent aspects of both lower lobes bilaterally. No focal consolidation to suggest pneumonia or pulmonary contusion. No pulmonary edema or pleural effusion. No pneumothorax. No worrisome pulmonary nodule or mass. Musculoskeletal: External soft tissues demonstrate no acute finding. No acute osseous abnormality. No discrete lytic or blastic osseous lesions. Remotely healed fracture of the right posterior fourth rib noted. CT ABDOMEN PELVIS FINDINGS Hepatobiliary: Liver intact and demonstrates a normal contrast enhanced appearance. Gallbladder surgically absent. No biliary dilatation. Pancreas: Pancreas within normal limits. Spleen: Spleen intact without acute finding. 5 mm subcapsular cyst noted posteriorly. Adrenals/Urinary Tract: Adrenal glands are normal. Kidneys equal size with symmetric enhancement. Kidneys somewhat lobulated bilaterally with multifocal cortical scarring. No nephrolithiasis, hydronephrosis, or focal enhancing renal mass. Subcentimeter hypodensity at the lower pole the right kidney too small the characterize, but statistically likely reflects small cyst. No hydroureter. Bladder moderately distended without acute finding. Stomach/Bowel:  Stomach within normal limits. No evidence for bowel obstruction or acute bowel injury. No acute inflammatory changes seen about the bowels. Vascular/Lymphatic: Normal intravascular enhancement seen throughout the intra-abdominal aorta and its branch vessels. No adenopathy. Reproductive: IUD in place within the uterus. Small bilateral adnexal cyst noted, of doubtful significance given size and patient age. Other: No free air or fluid. No mesenteric or retroperitoneal hematoma. Musculoskeletal: Mild hazy stranding within the subcutaneous fat of the lower mid back, which could reflect mild subcutaneous edema or possibly mild contusion (series 3, image 92). No frank hematoma. External soft tissues otherwise unremarkable. No acute osseous abnormality. No discrete lytic or blastic osseous lesions. IMPRESSION: 1. Mild hazy stranding within the subcutaneous fat along the mid lower back, which could reflect mild subcutaneous edema or contusion. Correlation with physical exam recommended. 2. No other acute traumatic injury within the chest, abdomen, and pelvis. 3. No other acute finding identified. Electronically Signed   By: Jeannine Boga M.D.   On: 04/29/2018 22:27   Dg Chest Port 1 View  Result Date: 04/29/2018 CLINICAL DATA:  Pain following fall EXAM: PORTABLE CHEST 1 VIEW COMPARISON:  None. FINDINGS: There is no edema or consolidation. Heart is upper normal in size with pulmonary vascularity normal. No adenopathy. No evident pneumothorax. No evident fracture. There are surgical clips in the right upper quadrant region. IMPRESSION: No edema or consolidation.  No evident pneumothorax. Electronically Signed   By: Lowella Grip III M.D.   On: 04/29/2018 20:48    Procedures Procedures (including critical care time)  Medications Ordered in  ED Medications  0.9 %  sodium chloride infusion ( Intravenous Stopped 04/30/18 0010)  fentaNYL (SUBLIMAZE) injection 25 mcg (25 mcg Intravenous Given 04/29/18 2130)    ondansetron (ZOFRAN) injection 4 mg (4 mg Intravenous Given 04/29/18 2130)  iohexol (OMNIPAQUE) 300 MG/ML solution 100 mL (100 mLs Intravenous Contrast Given 04/29/18 2142)     Initial Impression / Assessment and Plan / ED Course  I have reviewed the triage vital signs and the nursing notes.  Pertinent labs & imaging results that were available during my care of the patient were reviewed by me and considered in my medical decision making (see chart for details).        Patient is a 39 year old female who presented to the emergency department as a level 2 trauma for fall with potential head trauma and altered mental status.  Upon initial evaluation of the patient she was hemodynamically stable and nontoxic-appearing.  ABCs were quickly assessed.  Patient's airway was intact and she had bilateral breath sounds.  2 large-bore IVs were obtained and patient's manual blood pressure was within normal limits.  Patient had no obvious traumatic injuries over the scalp or skull however she appeared to be somewhat confused.  She was alert however orientation is difficult to assess as the patient has some dysarthric speech.  She is able to initiate the word hospital when asked where she is however displays intentional blinking and lip smacking.  Once primary survey was completed chest x-ray was obtained which showed no obvious hemo-/pneumothorax.  Secondary examination complete as per physical exam above.  Patient had no other traumatic injuries that were easily identifiable.  She was moving all 4 extremities equally and had no tenderness palpation over thoracic or lumbar spine.  Labs were drawn which were largely unremarkable.  CBC with no leukocytosis and hemoglobin within normal limits.  CMP with slight creatinine bump at 1.23 however no other significant metabolic or electrolyte derangements are present.  Coagulation panel within normal limits.  Patient's beta hCG negative.  EtOH negative. UDS positive  for benzodiazepine however patient has prescription for Xanax.  Given difficult history secondary to altered mental status, trauma pan scans were obtained.  CT head, CT C-spine with no acute traumatic injuries noted.  CT chest abdomen pelvis showing mild hazy stranding within the subcutaneous fat along the mid lower back which is favor to reflect mild subcutaneous edema versus contusion.  Patient has mild tenderness palpation in the paraspinal muscles of this region however no tenderness directly over thoracic or lumbar spine.  Patient was able to ambulate while in the emergency department without ataxia or gait disturbances.  She returned to her neurologic baseline.  Upon further questioning the patient states that she recalls all the events leading up to and following her fall from the tree.  She describes her mental status as an "out of body experience" where she was able to perceive all visual and auditory stimuli however was unable to verbally respond to questioning. She denies any intent to harm herself during fall, and states it was purely accidental that she lost her grip on the tree. As no significant traumatic injuries are noted on patient's imaging and she has returned to her neurologic/functional baseline, feel that she is safe for discharge at this time. She may have suffered from mild concussion. I discussed concerning signs and symptoms that would necessitate return to the emergency department.  Patient voiced understanding of these instructions and had no further questions at this time.  Final Clinical  Impressions(s) / ED Diagnoses   Final diagnoses:  Concussion without loss of consciousness, initial encounter  Forearm abrasion, infected, right, initial encounter    ED Discharge Orders    None       Tommie Raymond, MD 04/30/18 0022    Rex Kras, Wenda Overland, MD 04/30/18 2236

## 2018-04-29 NOTE — ED Notes (Signed)
Patient transported to CT scan . 

## 2018-04-29 NOTE — ED Notes (Addendum)
C- collar intact / repositioned for comfort , respirations unlabored , IV sites intact .

## 2018-04-30 ENCOUNTER — Encounter: Payer: Self-pay | Admitting: Internal Medicine

## 2018-04-30 LAB — CDS SEROLOGY

## 2018-04-30 NOTE — ED Notes (Signed)
Patient verbalized understanding on discharge plan .

## 2018-04-30 NOTE — ED Notes (Signed)
Patient ambulated the hallway with standby assist , denies dizziness ,  mild generalized body aches , EDP notified.

## 2018-05-20 DIAGNOSIS — F900 Attention-deficit hyperactivity disorder, predominantly inattentive type: Secondary | ICD-10-CM | POA: Diagnosis not present

## 2018-05-20 DIAGNOSIS — F3175 Bipolar disorder, in partial remission, most recent episode depressed: Secondary | ICD-10-CM | POA: Diagnosis not present

## 2018-05-26 ENCOUNTER — Ambulatory Visit (HOSPITAL_COMMUNITY)
Admission: EM | Admit: 2018-05-26 | Discharge: 2018-05-26 | Disposition: A | Discharge status: Home / Self Care | Payer: Medicare Other | Attending: Family Medicine | Admitting: Family Medicine

## 2018-05-26 ENCOUNTER — Encounter (HOSPITAL_COMMUNITY): Payer: Self-pay

## 2018-05-26 ENCOUNTER — Other Ambulatory Visit: Payer: Self-pay

## 2018-05-26 DIAGNOSIS — M5010 Cervical disc disorder with radiculopathy, unspecified cervical region: Secondary | ICD-10-CM

## 2018-05-26 DIAGNOSIS — F3175 Bipolar disorder, in partial remission, most recent episode depressed: Secondary | ICD-10-CM | POA: Diagnosis not present

## 2018-05-26 DIAGNOSIS — F900 Attention-deficit hyperactivity disorder, predominantly inattentive type: Secondary | ICD-10-CM | POA: Diagnosis not present

## 2018-05-26 MED ORDER — HYDROCODONE-ACETAMINOPHEN 5-325 MG PO TABS: 1.0000 | ORAL_TABLET | Freq: Four times a day (QID) | ORAL | 0 refills | Status: DC | PRN

## 2018-05-26 NOTE — Discharge Instructions (Addendum)
Be aware, pain medications may cause drowsiness. Please do not drive, operate heavy machinery or make important decisions while on this medication, it can cloud your judgement.  

## 2018-05-26 NOTE — ED Triage Notes (Signed)
Pt presents with chronic arm and shoulder pain that has been unrelieved with OTC medication.

## 2018-06-02 DIAGNOSIS — F900 Attention-deficit hyperactivity disorder, predominantly inattentive type: Secondary | ICD-10-CM | POA: Diagnosis not present

## 2018-06-02 DIAGNOSIS — F3175 Bipolar disorder, in partial remission, most recent episode depressed: Secondary | ICD-10-CM | POA: Diagnosis not present

## 2018-06-09 DIAGNOSIS — F900 Attention-deficit hyperactivity disorder, predominantly inattentive type: Secondary | ICD-10-CM | POA: Diagnosis not present

## 2018-06-09 DIAGNOSIS — F3175 Bipolar disorder, in partial remission, most recent episode depressed: Secondary | ICD-10-CM | POA: Diagnosis not present

## 2018-06-16 DIAGNOSIS — F900 Attention-deficit hyperactivity disorder, predominantly inattentive type: Secondary | ICD-10-CM | POA: Diagnosis not present

## 2018-06-16 DIAGNOSIS — F3175 Bipolar disorder, in partial remission, most recent episode depressed: Secondary | ICD-10-CM | POA: Diagnosis not present
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
## 2018-06-23 DIAGNOSIS — F900 Attention-deficit hyperactivity disorder, predominantly inattentive type: Secondary | ICD-10-CM | POA: Diagnosis not present

## 2018-06-23 DIAGNOSIS — F3175 Bipolar disorder, in partial remission, most recent episode depressed: Secondary | ICD-10-CM | POA: Diagnosis not present

## 2018-06-30 DIAGNOSIS — F3175 Bipolar disorder, in partial remission, most recent episode depressed: Secondary | ICD-10-CM | POA: Diagnosis not present

## 2018-06-30 DIAGNOSIS — F900 Attention-deficit hyperactivity disorder, predominantly inattentive type: Secondary | ICD-10-CM | POA: Diagnosis not present

## 2018-07-04 ENCOUNTER — Encounter (HOSPITAL_COMMUNITY): Payer: Self-pay | Admitting: Emergency Medicine

## 2018-07-04 ENCOUNTER — Ambulatory Visit (HOSPITAL_COMMUNITY)
Admission: EM | Admit: 2018-07-04 | Discharge: 2018-07-04 | Disposition: A | Discharge status: Home / Self Care | Payer: Medicare Other | Attending: Internal Medicine | Admitting: Internal Medicine

## 2018-07-04 DIAGNOSIS — M79631 Pain in right forearm: Secondary | ICD-10-CM

## 2018-07-04 MED ORDER — CYCLOBENZAPRINE HCL 10 MG PO TABS: 10.0000 mg | ORAL_TABLET | Freq: Three times a day (TID) | ORAL | 0 refills | Status: DC | PRN
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
## 2018-07-06 DIAGNOSIS — M542 Cervicalgia: Secondary | ICD-10-CM | POA: Diagnosis not present

## 2018-07-06 DIAGNOSIS — M5412 Radiculopathy, cervical region: Secondary | ICD-10-CM | POA: Diagnosis not present

## 2018-07-07 DIAGNOSIS — F900 Attention-deficit hyperactivity disorder, predominantly inattentive type: Secondary | ICD-10-CM | POA: Diagnosis not present

## 2018-07-07 DIAGNOSIS — F3175 Bipolar disorder, in partial remission, most recent episode depressed: Secondary | ICD-10-CM | POA: Diagnosis not present

## 2018-07-14 DIAGNOSIS — F3175 Bipolar disorder, in partial remission, most recent episode depressed: Secondary | ICD-10-CM | POA: Diagnosis not present

## 2018-07-14 DIAGNOSIS — F900 Attention-deficit hyperactivity disorder, predominantly inattentive type: Secondary | ICD-10-CM | POA: Diagnosis not present

## 2018-07-21 DIAGNOSIS — F900 Attention-deficit hyperactivity disorder, predominantly inattentive type: Secondary | ICD-10-CM | POA: Diagnosis not present

## 2018-07-21 DIAGNOSIS — F3175 Bipolar disorder, in partial remission, most recent episode depressed: Secondary | ICD-10-CM | POA: Diagnosis not present

## 2018-07-28 DIAGNOSIS — F3175 Bipolar disorder, in partial remission, most recent episode depressed: Secondary | ICD-10-CM | POA: Diagnosis not present

## 2018-07-28 DIAGNOSIS — F900 Attention-deficit hyperactivity disorder, predominantly inattentive type: Secondary | ICD-10-CM | POA: Diagnosis not present

## 2018-07-29 DIAGNOSIS — F3175 Bipolar disorder, in partial remission, most recent episode depressed: Secondary | ICD-10-CM | POA: Diagnosis not present

## 2018-07-29 DIAGNOSIS — F900 Attention-deficit hyperactivity disorder, predominantly inattentive type: Secondary | ICD-10-CM | POA: Diagnosis not present

## 2018-08-04 DIAGNOSIS — F3175 Bipolar disorder, in partial remission, most recent episode depressed: Secondary | ICD-10-CM | POA: Diagnosis not present

## 2018-08-04 DIAGNOSIS — F900 Attention-deficit hyperactivity disorder, predominantly inattentive type: Secondary | ICD-10-CM | POA: Diagnosis not present

## 2018-08-05 DIAGNOSIS — M5412 Radiculopathy, cervical region: Secondary | ICD-10-CM | POA: Diagnosis not present

## 2018-08-11 DIAGNOSIS — F900 Attention-deficit hyperactivity disorder, predominantly inattentive type: Secondary | ICD-10-CM | POA: Diagnosis not present

## 2018-08-11 DIAGNOSIS — F3175 Bipolar disorder, in partial remission, most recent episode depressed: Secondary | ICD-10-CM | POA: Diagnosis not present

## 2018-08-18 DIAGNOSIS — F900 Attention-deficit hyperactivity disorder, predominantly inattentive type: Secondary | ICD-10-CM | POA: Diagnosis not present

## 2018-08-18 DIAGNOSIS — F3175 Bipolar disorder, in partial remission, most recent episode depressed: Secondary | ICD-10-CM | POA: Diagnosis not present

## 2018-08-25 DIAGNOSIS — F3175 Bipolar disorder, in partial remission, most recent episode depressed: Secondary | ICD-10-CM | POA: Diagnosis not present

## 2018-08-25 DIAGNOSIS — F900 Attention-deficit hyperactivity disorder, predominantly inattentive type: Secondary | ICD-10-CM | POA: Diagnosis not present

## 2018-09-01 DIAGNOSIS — F3175 Bipolar disorder, in partial remission, most recent episode depressed: Secondary | ICD-10-CM | POA: Diagnosis not present

## 2018-09-01 DIAGNOSIS — F900 Attention-deficit hyperactivity disorder, predominantly inattentive type: Secondary | ICD-10-CM | POA: Diagnosis not present

## 2018-09-07 DIAGNOSIS — M5412 Radiculopathy, cervical region: Secondary | ICD-10-CM | POA: Diagnosis not present

## 2018-09-07 DIAGNOSIS — M542 Cervicalgia: Secondary | ICD-10-CM | POA: Diagnosis not present

## 2018-09-09 ENCOUNTER — Ambulatory Visit (INDEPENDENT_AMBULATORY_CARE_PROVIDER_SITE_OTHER): Payer: Medicare Other | Admitting: Orthopaedic Surgery

## 2018-09-09 ENCOUNTER — Encounter (HOSPITAL_COMMUNITY): Payer: Self-pay | Admitting: Emergency Medicine

## 2018-09-09 ENCOUNTER — Telehealth: Payer: Self-pay | Admitting: Physician Assistant

## 2018-09-09 ENCOUNTER — Ambulatory Visit (HOSPITAL_COMMUNITY)
Admission: EM | Admit: 2018-09-09 | Discharge: 2018-09-09 | Disposition: A | Discharge status: Home / Self Care | Payer: Medicare Other | Attending: Family Medicine | Admitting: Family Medicine

## 2018-09-09 ENCOUNTER — Other Ambulatory Visit: Payer: Self-pay

## 2018-09-09 DIAGNOSIS — Z888 Allergy status to other drugs, medicaments and biological substances status: Secondary | ICD-10-CM | POA: Diagnosis not present

## 2018-09-09 DIAGNOSIS — B20 Human immunodeficiency virus [HIV] disease: Secondary | ICD-10-CM | POA: Diagnosis not present

## 2018-09-09 DIAGNOSIS — N182 Chronic kidney disease, stage 2 (mild): Secondary | ICD-10-CM | POA: Insufficient documentation

## 2018-09-09 DIAGNOSIS — Z886 Allergy status to analgesic agent status: Secondary | ICD-10-CM | POA: Diagnosis not present

## 2018-09-09 DIAGNOSIS — Z793 Long term (current) use of hormonal contraceptives: Secondary | ICD-10-CM | POA: Diagnosis not present

## 2018-09-09 DIAGNOSIS — Z791 Long term (current) use of non-steroidal anti-inflammatories (NSAID): Secondary | ICD-10-CM | POA: Insufficient documentation

## 2018-09-09 DIAGNOSIS — Z20828 Contact with and (suspected) exposure to other viral communicable diseases: Secondary | ICD-10-CM | POA: Diagnosis not present

## 2018-09-09 DIAGNOSIS — Z885 Allergy status to narcotic agent status: Secondary | ICD-10-CM | POA: Diagnosis not present

## 2018-09-09 DIAGNOSIS — F4310 Post-traumatic stress disorder, unspecified: Secondary | ICD-10-CM | POA: Insufficient documentation

## 2018-09-09 DIAGNOSIS — B370 Candidal stomatitis: Secondary | ICD-10-CM

## 2018-09-09 DIAGNOSIS — Z881 Allergy status to other antibiotic agents status: Secondary | ICD-10-CM | POA: Diagnosis not present

## 2018-09-09 DIAGNOSIS — E0590 Thyrotoxicosis, unspecified without thyrotoxic crisis or storm: Secondary | ICD-10-CM | POA: Diagnosis not present

## 2018-09-09 DIAGNOSIS — M5412 Radiculopathy, cervical region: Secondary | ICD-10-CM

## 2018-09-09 DIAGNOSIS — R439 Unspecified disturbances of smell and taste: Secondary | ICD-10-CM | POA: Diagnosis present

## 2018-09-09 DIAGNOSIS — Z79899 Other long term (current) drug therapy: Secondary | ICD-10-CM | POA: Diagnosis not present

## 2018-09-09 DIAGNOSIS — Z91410 Personal history of adult physical and sexual abuse: Secondary | ICD-10-CM | POA: Insufficient documentation

## 2018-09-09 DIAGNOSIS — F319 Bipolar disorder, unspecified: Secondary | ICD-10-CM | POA: Insufficient documentation

## 2018-09-09 MED ORDER — MAGIC MOUTHWASH W/LIDOCAINE: 5.0000 mL | Freq: Three times a day (TID) | ORAL | 0 refills | Status: DC | PRN

## 2018-09-09 NOTE — Discharge Instructions (Signed)
I believe this is thrush, not COVID We did a COVID test to be safe.  Use the mouthwash as prescribed Follow up as needed for continued or worsening symptoms
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
## 2018-09-09 NOTE — Progress Notes (Signed)
Patient rescheduled for covid testing.

## 2018-09-09 NOTE — Telephone Encounter (Signed)
Patient came in no temp but advised during questionnaire she had lost of taste and bumps in her mouth. Yolanda Jones and patient was advised to go to [facility] for [employer].  Left without being seen. Advised patient to call after results to r/s

## 2018-09-09 NOTE — ED Triage Notes (Signed)
Pt here with loss of taste and also sts some "bumps on back of tongue"

## 2018-09-11 LAB — NOVEL CORONAVIRUS, NAA (HOSP ORDER, SEND-OUT TO REF LAB; TAT 18-24 HRS): SARS-CoV-2, NAA: NOT DETECTED

## 2018-09-13 ENCOUNTER — Encounter (HOSPITAL_COMMUNITY): Payer: Self-pay

## 2018-09-15 DIAGNOSIS — F900 Attention-deficit hyperactivity disorder, predominantly inattentive type: Secondary | ICD-10-CM | POA: Diagnosis not present

## 2018-09-15 DIAGNOSIS — F3175 Bipolar disorder, in partial remission, most recent episode depressed: Secondary | ICD-10-CM | POA: Diagnosis not present

## 2018-09-21 ENCOUNTER — Ambulatory Visit (INDEPENDENT_AMBULATORY_CARE_PROVIDER_SITE_OTHER): Payer: Medicare Other | Admitting: Orthopaedic Surgery

## 2018-09-21 ENCOUNTER — Ambulatory Visit (INDEPENDENT_AMBULATORY_CARE_PROVIDER_SITE_OTHER): Payer: Medicare Other

## 2018-09-21 ENCOUNTER — Encounter: Payer: Self-pay | Admitting: Orthopaedic Surgery

## 2018-09-21 DIAGNOSIS — M25511 Pain in right shoulder: Secondary | ICD-10-CM | POA: Diagnosis not present

## 2018-09-21 MED ORDER — METHOCARBAMOL 500 MG PO TABS: 500.0000 mg | ORAL_TABLET | Freq: Four times a day (QID) | ORAL | 2 refills | Status: DC | PRN
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
## 2018-09-22 DIAGNOSIS — F900 Attention-deficit hyperactivity disorder, predominantly inattentive type: Secondary | ICD-10-CM | POA: Diagnosis not present

## 2018-09-22 DIAGNOSIS — F3175 Bipolar disorder, in partial remission, most recent episode depressed: Secondary | ICD-10-CM | POA: Diagnosis not present

## 2018-09-22 LAB — URIC ACID: Uric Acid, Serum: 3.9 mg/dL (ref 2.5–7.0)

## 2018-09-22 LAB — RHEUMATOID FACTOR: Rhuematoid fact SerPl-aCnc: <14 IU/mL (ref <14)

## 2018-09-22 LAB — C-REACTIVE PROTEIN: CRP: 2.5 mg/L (ref <8.0)

## 2018-09-22 LAB — SEDIMENTATION RATE: Sed Rate: 28 mm/h — ABNORMAL HIGH (ref 0–20)

## 2018-09-23 LAB — ANA: ANA Titer 1: NEGATIVE

## 2018-09-28 DIAGNOSIS — F3175 Bipolar disorder, in partial remission, most recent episode depressed: Secondary | ICD-10-CM | POA: Diagnosis not present

## 2018-09-28 DIAGNOSIS — F900 Attention-deficit hyperactivity disorder, predominantly inattentive type: Secondary | ICD-10-CM | POA: Diagnosis not present

## 2018-09-29 DIAGNOSIS — F900 Attention-deficit hyperactivity disorder, predominantly inattentive type: Secondary | ICD-10-CM | POA: Diagnosis not present

## 2018-09-29 DIAGNOSIS — F3175 Bipolar disorder, in partial remission, most recent episode depressed: Secondary | ICD-10-CM | POA: Diagnosis not present

## 2018-10-01 ENCOUNTER — Ambulatory Visit (INDEPENDENT_AMBULATORY_CARE_PROVIDER_SITE_OTHER): Payer: Medicare Other | Admitting: Physical Medicine and Rehabilitation

## 2018-10-01 ENCOUNTER — Ambulatory Visit: Payer: Self-pay

## 2018-10-01 ENCOUNTER — Encounter: Payer: Self-pay | Admitting: Physical Medicine and Rehabilitation

## 2018-10-01 DIAGNOSIS — R202 Paresthesia of skin: Secondary | ICD-10-CM | POA: Diagnosis not present

## 2018-10-01 NOTE — Progress Notes (Signed)
 .  Numeric Pain Rating Scale and Functional Assessment Average Pain 7   In the last MONTH (on 0-10 scale) has pain interfered with the following?  1. General activity like being  able to carry out your everyday physical activities such as walking, climbing stairs, carrying groceries, or moving a chair?  Rating(9)

## 2018-10-04 NOTE — Procedures (Signed)
EMG & NCV Findings: Evaluation of the right median (across palm) sensory nerve showed no response (Palm).  All remaining nerves (as indicated in the following tables) were within normal limits.    All examined muscles (as indicated in the following table) showed no evidence of electrical instability.    Impression: Essentially NORMAL electrodiagnostic study of the right upper limb.  There is no significant electrodiagnostic evidence of nerve entrapment, brachial plexopathy or cervical radiculopathy.    As you know, purely sensory or demyelinating radiculopathies and chemical radiculitis may not be detected with this particular electrodiagnostic study.  Recommendations: 1.  Follow-up with referring physician. 2.  Continue current management of symptoms.  ___________________________ Laurence Spates FAAPMR Board Certified, American Board of Physical Medicine and Rehabilitation    Nerve Conduction Studies Anti Sensory Summary Table   Stim Site NR Peak (ms) Norm Peak (ms) P-T Amp (V) Norm P-T Amp Site1 Site2 Delta-P (ms) Dist (cm) Vel (m/s) Norm Vel (m/s)  Right Median Acr Palm Anti Sensory (2nd Digit)  33.1C  Wrist    3.4 <3.6 22.9 >10 Wrist Palm  0.0    Palm *NR  <2.0          Right Radial Anti Sensory (Base 1st Digit)  32.8C  Wrist    2.0 <3.1 40.2  Wrist Base 1st Digit 2.0 0.0    Right Ulnar Anti Sensory (5th Digit)  33.3C  Wrist    2.9 <3.7 29.7 >15.0 Wrist 5th Digit 2.9 14.0 48 >38   Motor Summary Table   Stim Site NR Onset (ms) Norm Onset (ms) O-P Amp (mV) Norm O-P Amp Site1 Site2 Delta-0 (ms) Dist (cm) Vel (m/s) Norm Vel (m/s)  Right Median Motor (Abd Poll Brev)  32.9C  Wrist    3.0 <4.2 8.2 >5 Elbow Wrist 3.4 17.5 51 >50  Elbow    6.4  8.0         Right Ulnar Motor (Abd Dig Min)  32.8C  Wrist    2.8 <4.2 7.4 >3 B Elbow Wrist 2.4 17.0 71 >53  B Elbow    5.2  7.5  A Elbow B Elbow 1.1 8.0 73 >53  A Elbow    6.3  7.6          EMG   Side Muscle Nerve Root Ins Act Fibs  Psw Amp Dur Poly Recrt Int Fraser Din Comment  Right 1stDorInt Ulnar C8-T1 Nml Nml Nml Nml Nml 0 Nml Nml   Right Abd Poll Brev Median C8-T1 Nml Nml Nml Nml Nml 0 Nml Nml   Right ExtDigCom   Nml Nml Nml Nml Nml 0 Nml Nml   Right Triceps Radial C6-7-8 Nml Nml Nml Nml Nml 0 Nml Nml   Right Deltoid Axillary C5-6 Nml Nml Nml Nml Nml 0 Nml Nml     Nerve Conduction Studies Anti Sensory Left/Right Comparison   Stim Site L Lat (ms) R Lat (ms) L-R Lat (ms) L Amp (V) R Amp (V) L-R Amp (%) Site1 Site2 L Vel (m/s) R Vel (m/s) L-R Vel (m/s)  Median Acr Palm Anti Sensory (2nd Digit)  33.1C  Wrist  3.4   22.9  Wrist Palm     Palm             Radial Anti Sensory (Base 1st Digit)  32.8C  Wrist  2.0   40.2  Wrist Base 1st Digit     Ulnar Anti Sensory (5th Digit)  33.3C  Wrist  2.9   29.7  Wrist  5th Digit  48    Motor Left/Right Comparison   Stim Site L Lat (ms) R Lat (ms) L-R Lat (ms) L Amp (mV) R Amp (mV) L-R Amp (%) Site1 Site2 L Vel (m/s) R Vel (m/s) L-R Vel (m/s)  Median Motor (Abd Poll Brev)  32.9C  Wrist  3.0   8.2  Elbow Wrist  51   Elbow  6.4   8.0        Ulnar Motor (Abd Dig Min)  32.8C  Wrist  2.8   7.4  B Elbow Wrist  71   B Elbow  5.2   7.5  A Elbow B Elbow  73   A Elbow  6.3   7.6           Waveforms:
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
## 2018-10-05 ENCOUNTER — Ambulatory Visit: Payer: Medicare Other | Admitting: Orthopaedic Surgery

## 2018-10-06 DIAGNOSIS — F3175 Bipolar disorder, in partial remission, most recent episode depressed: Secondary | ICD-10-CM | POA: Diagnosis not present

## 2018-10-13 ENCOUNTER — Ambulatory Visit (INDEPENDENT_AMBULATORY_CARE_PROVIDER_SITE_OTHER): Payer: Medicare Other | Admitting: Orthopaedic Surgery

## 2018-10-13 ENCOUNTER — Encounter: Payer: Self-pay | Admitting: Orthopaedic Surgery

## 2018-10-13 DIAGNOSIS — M79601 Pain in right arm: Secondary | ICD-10-CM | POA: Diagnosis not present
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
## 2018-10-27 ENCOUNTER — Other Ambulatory Visit: Payer: Medicare Other

## 2018-10-27 DIAGNOSIS — F3175 Bipolar disorder, in partial remission, most recent episode depressed: Secondary | ICD-10-CM | POA: Diagnosis not present

## 2018-11-03 DIAGNOSIS — F3175 Bipolar disorder, in partial remission, most recent episode depressed: Secondary | ICD-10-CM | POA: Diagnosis not present

## 2018-11-10 ENCOUNTER — Encounter: Payer: Medicare Other | Admitting: Internal Medicine

## 2018-11-10 DIAGNOSIS — F3175 Bipolar disorder, in partial remission, most recent episode depressed: Secondary | ICD-10-CM | POA: Diagnosis not present

## 2018-11-16 ENCOUNTER — Other Ambulatory Visit: Payer: Medicare Other

## 2018-11-18 DIAGNOSIS — F3175 Bipolar disorder, in partial remission, most recent episode depressed: Secondary | ICD-10-CM | POA: Diagnosis not present

## 2018-11-24 ENCOUNTER — Telehealth: Payer: Self-pay | Admitting: Diagnostic Neuroimaging

## 2018-11-24 NOTE — Telephone Encounter (Signed)
lvm to discuss r/s 10/19 appt due to MD being out

## 2018-11-29 ENCOUNTER — Ambulatory Visit: Payer: Medicare Other | Admitting: Diagnostic Neuroimaging

## 2018-12-01 ENCOUNTER — Encounter: Payer: Medicare Other | Admitting: Internal Medicine

## 2018-12-10 ENCOUNTER — Other Ambulatory Visit: Payer: Self-pay

## 2018-12-10 DIAGNOSIS — Z113 Encounter for screening for infections with a predominantly sexual mode of transmission: Secondary | ICD-10-CM

## 2018-12-10 DIAGNOSIS — Z79899 Other long term (current) drug therapy: Secondary | ICD-10-CM

## 2018-12-10 DIAGNOSIS — B20 Human immunodeficiency virus [HIV] disease: Secondary | ICD-10-CM

## 2018-12-13 ENCOUNTER — Other Ambulatory Visit: Payer: Self-pay

## 2018-12-13 ENCOUNTER — Other Ambulatory Visit: Payer: Medicare Other

## 2018-12-13 DIAGNOSIS — Z79899 Other long term (current) drug therapy: Secondary | ICD-10-CM | POA: Diagnosis not present

## 2018-12-13 DIAGNOSIS — B20 Human immunodeficiency virus [HIV] disease: Secondary | ICD-10-CM | POA: Diagnosis not present

## 2018-12-13 DIAGNOSIS — Z113 Encounter for screening for infections with a predominantly sexual mode of transmission: Secondary | ICD-10-CM | POA: Diagnosis not present

## 2018-12-13 DIAGNOSIS — Z23 Encounter for immunization: Secondary | ICD-10-CM | POA: Diagnosis not present

## 2018-12-14 LAB — T-HELPER CELL (CD4) - (RCID CLINIC ONLY)
CD4 % Helper T Cell: 53 % (ref 33–65)
CD4 T Cell Abs: 911 /uL (ref 400–1790)

## 2018-12-17 LAB — CBC WITH DIFFERENTIAL/PLATELET
Absolute Monocytes: 448 cells/uL (ref 200–950)
Basophils Absolute: 38 cells/uL (ref 0–200)
Basophils Relative: 0.7 %
Eosinophils Absolute: 189 cells/uL (ref 15–500)
Eosinophils Relative: 3.5 %
HCT: 41 % (ref 35.0–45.0)
Hemoglobin: 13.8 g/dL (ref 11.7–15.5)
Lymphs Abs: 1706 cells/uL (ref 850–3900)
MCH: 32.3 pg (ref 27.0–33.0)
MCHC: 33.7 g/dL (ref 32.0–36.0)
MCV: 96 fL (ref 80.0–100.0)
MPV: 10.7 fL (ref 7.5–12.5)
Monocytes Relative: 8.3 %
Neutro Abs: 3019 cells/uL (ref 1500–7800)
Neutrophils Relative %: 55.9 %
Platelets: 296 10*3/uL (ref 140–400)
RBC: 4.27 10*6/uL (ref 3.80–5.10)
RDW: 13 % (ref 11.0–15.0)
Total Lymphocyte: 31.6 %
WBC: 5.4 10*3/uL (ref 3.8–10.8)

## 2018-12-17 LAB — COMPREHENSIVE METABOLIC PANEL
AG Ratio: 1.4 (calc) (ref 1.0–2.5)
ALT: 10 U/L (ref 6–29)
AST: 14 U/L (ref 10–30)
Albumin: 3.7 g/dL (ref 3.6–5.1)
Alkaline phosphatase (APISO): 60 U/L (ref 31–125)
BUN/Creatinine Ratio: 13 (calc) (ref 6–22)
BUN: 16 mg/dL (ref 7–25)
CO2: 25 mmol/L (ref 20–32)
Calcium: 9.4 mg/dL (ref 8.6–10.2)
Chloride: 110 mmol/L (ref 98–110)
Creat: 1.26 mg/dL — ABNORMAL HIGH (ref 0.50–1.10)
Globulin: 2.7 g/dL (calc) (ref 1.9–3.7)
Glucose, Bld: 96 mg/dL (ref 65–99)
Potassium: 4.4 mmol/L (ref 3.5–5.3)
Sodium: 139 mmol/L (ref 135–146)
Total Bilirubin: 0.2 mg/dL (ref 0.2–1.2)
Total Protein: 6.4 g/dL (ref 6.1–8.1)

## 2018-12-17 LAB — HIV-1 RNA QUANT-NO REFLEX-BLD
HIV 1 RNA Quant: <20 copies/mL
HIV-1 RNA Quant, Log: <1.3 Log copies/mL

## 2018-12-17 LAB — LIPID PANEL
Cholesterol: 214 mg/dL — ABNORMAL HIGH (ref <200)
HDL: 43 mg/dL — ABNORMAL LOW (ref >=50)
LDL Cholesterol (Calc): 139 mg/dL (calc) — ABNORMAL HIGH
Non-HDL Cholesterol (Calc): 171 mg/dL (calc) — ABNORMAL HIGH (ref <130)
Total CHOL/HDL Ratio: 5 (calc) — ABNORMAL HIGH (ref <5.0)
Triglycerides: 187 mg/dL — ABNORMAL HIGH (ref <150)

## 2018-12-17 LAB — RPR: RPR Ser Ql: NONREACTIVE

## 2018-12-27 ENCOUNTER — Other Ambulatory Visit: Payer: Self-pay

## 2018-12-27 ENCOUNTER — Ambulatory Visit (INDEPENDENT_AMBULATORY_CARE_PROVIDER_SITE_OTHER): Payer: Medicare Other | Admitting: Internal Medicine

## 2018-12-27 ENCOUNTER — Encounter: Payer: Self-pay | Admitting: Internal Medicine

## 2018-12-27 VITALS — Ht 60.0 in | Wt 159.0 lb

## 2018-12-27 DIAGNOSIS — Z7189 Other specified counseling: Secondary | ICD-10-CM | POA: Diagnosis not present

## 2018-12-27 DIAGNOSIS — B20 Human immunodeficiency virus [HIV] disease: Secondary | ICD-10-CM | POA: Diagnosis not present

## 2018-12-27 DIAGNOSIS — N182 Chronic kidney disease, stage 2 (mild): Secondary | ICD-10-CM | POA: Diagnosis not present

## 2018-12-27 DIAGNOSIS — Z5181 Encounter for therapeutic drug level monitoring: Secondary | ICD-10-CM | POA: Diagnosis not present

## 2018-12-27 DIAGNOSIS — Z7185 Encounter for immunization safety counseling: Secondary | ICD-10-CM

## 2018-12-28 ENCOUNTER — Encounter: Payer: Self-pay | Admitting: Internal Medicine

## 2018-12-28 DIAGNOSIS — F3175 Bipolar disorder, in partial remission, most recent episode depressed: Secondary | ICD-10-CM | POA: Diagnosis not present

## 2018-12-28 NOTE — Assessment & Plan Note (Signed)
No significant lab abnormalities

## 2018-12-28 NOTE — Progress Notes (Signed)
  Subjective:    Patient ID: Yolanda Jones, female    DOB: 30-May-1980, 38 y.o.   MRN: [id number]  HPI  She comes in for follow up of HIV She continues on [place] which was changed from [place] and no issues.  No missed doses.  She did have a short relapse of drug use but no longer using.  CD4 911, viral load < 20.  Creat remains mildly elevated at 1.26 but similar to previous.  No associated n/v/d.    Review of Systems  Constitutional: Negative for fatigue.  Gastrointestinal: Negative for diarrhea.  Skin: Negative for rash.  Neurological: Negative for dizziness.       Objective:   Physical Exam  Constitutional: She appears well-developed and well-nourished.  Eyes: Right eye exhibits no discharge. Left eye exhibits no discharge. No scleral icterus.  Cardiovascular: Normal rate and regular rhythm.  No murmur heard. Pulmonary/Chest: Effort normal.  Skin: No rash noted.  Psychiatric: Her behavior is normal. Thought content normal.   SH: no current drug use after relapse     Assessment & Plan:

## 2018-12-28 NOTE — Assessment & Plan Note (Signed)
Due for pneumovax but will get next visit.  Had flu shot already

## 2018-12-28 NOTE — Assessment & Plan Note (Signed)
Doing well with Biktarvy, good immunoreconstitution and no concerns.  rtc 6 months

## 2018-12-28 NOTE — Assessment & Plan Note (Signed)
Stable at this time, no change and no indication for medication change.

## 2019-01-03 DIAGNOSIS — D649 Anemia, unspecified: Secondary | ICD-10-CM | POA: Diagnosis not present

## 2019-01-03 DIAGNOSIS — Z01411 Encounter for gynecological examination (general) (routine) with abnormal findings: Secondary | ICD-10-CM | POA: Diagnosis not present

## 2019-01-03 DIAGNOSIS — Z1231 Encounter for screening mammogram for malignant neoplasm of breast: Secondary | ICD-10-CM | POA: Diagnosis not present

## 2019-01-03 DIAGNOSIS — R87610 Atypical squamous cells of undetermined significance on cytologic smear of cervix (ASC-US): Secondary | ICD-10-CM | POA: Diagnosis not present

## 2019-01-03 DIAGNOSIS — E221 Hyperprolactinemia: Secondary | ICD-10-CM | POA: Diagnosis not present

## 2019-01-03 DIAGNOSIS — Z30431 Encounter for routine checking of intrauterine contraceptive device: Secondary | ICD-10-CM | POA: Diagnosis not present

## 2019-01-03 DIAGNOSIS — E559 Vitamin D deficiency, unspecified: Secondary | ICD-10-CM | POA: Diagnosis not present

## 2019-01-12 ENCOUNTER — Encounter: Payer: Self-pay | Admitting: Diagnostic Neuroimaging

## 2019-01-12 ENCOUNTER — Ambulatory Visit (INDEPENDENT_AMBULATORY_CARE_PROVIDER_SITE_OTHER): Payer: Medicare Other | Admitting: Diagnostic Neuroimaging

## 2019-01-12 VITALS — BP 113/79 | HR 87 | Temp 97.0°F | Ht 60.0 in | Wt 162.8 lb

## 2019-01-12 DIAGNOSIS — M79601 Pain in right arm: Secondary | ICD-10-CM

## 2019-01-12 DIAGNOSIS — M5412 Radiculopathy, cervical region: Secondary | ICD-10-CM
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
## 2019-01-14 ENCOUNTER — Other Ambulatory Visit: Payer: Self-pay | Admitting: Internal Medicine

## 2019-01-20 DIAGNOSIS — F3175 Bipolar disorder, in partial remission, most recent episode depressed: Secondary | ICD-10-CM | POA: Diagnosis not present

## 2019-03-02 DIAGNOSIS — F3175 Bipolar disorder, in partial remission, most recent episode depressed: Secondary | ICD-10-CM | POA: Diagnosis not present

## 2019-03-08 DIAGNOSIS — F3175 Bipolar disorder, in partial remission, most recent episode depressed: Secondary | ICD-10-CM | POA: Diagnosis not present

## 2019-03-09 DIAGNOSIS — Z79891 Long term (current) use of opiate analgesic: Secondary | ICD-10-CM | POA: Diagnosis not present

## 2019-03-10 DIAGNOSIS — E559 Vitamin D deficiency, unspecified: Secondary | ICD-10-CM | POA: Diagnosis not present

## 2019-03-16 DIAGNOSIS — F3175 Bipolar disorder, in partial remission, most recent episode depressed: Secondary | ICD-10-CM | POA: Diagnosis not present

## 2019-03-30 DIAGNOSIS — F3175 Bipolar disorder, in partial remission, most recent episode depressed: Secondary | ICD-10-CM | POA: Diagnosis not present

## 2019-04-05 ENCOUNTER — Other Ambulatory Visit: Payer: Self-pay | Admitting: Internal Medicine

## 2019-04-06 DIAGNOSIS — F3175 Bipolar disorder, in partial remission, most recent episode depressed: Secondary | ICD-10-CM | POA: Diagnosis not present

## 2019-04-20 DIAGNOSIS — F3175 Bipolar disorder, in partial remission, most recent episode depressed: Secondary | ICD-10-CM | POA: Diagnosis not present

## 2019-05-21 DIAGNOSIS — Z23 Encounter for immunization: Secondary | ICD-10-CM | POA: Diagnosis not present

## 2019-06-08 ENCOUNTER — Inpatient Hospital Stay (HOSPITAL_COMMUNITY)
Admission: EM | Admit: 2019-06-08 | Discharge: 2019-06-13 | DRG: 193 | Disposition: A | Discharge status: Home / Self Care | Payer: Medicare Other | Attending: Family Medicine | Admitting: Family Medicine

## 2019-06-08 ENCOUNTER — Other Ambulatory Visit: Payer: Self-pay

## 2019-06-08 ENCOUNTER — Emergency Department (HOSPITAL_COMMUNITY): Payer: Medicare Other

## 2019-06-08 ENCOUNTER — Encounter (HOSPITAL_COMMUNITY): Payer: Self-pay

## 2019-06-08 DIAGNOSIS — R739 Hyperglycemia, unspecified: Secondary | ICD-10-CM | POA: Diagnosis present

## 2019-06-08 DIAGNOSIS — B20 Human immunodeficiency virus [HIV] disease: Secondary | ICD-10-CM | POA: Diagnosis not present

## 2019-06-08 DIAGNOSIS — R0602 Shortness of breath: Secondary | ICD-10-CM | POA: Diagnosis not present

## 2019-06-08 DIAGNOSIS — F1910 Other psychoactive substance abuse, uncomplicated: Secondary | ICD-10-CM | POA: Diagnosis present

## 2019-06-08 DIAGNOSIS — B009 Herpesviral infection, unspecified: Secondary | ICD-10-CM | POA: Diagnosis present

## 2019-06-08 DIAGNOSIS — Z888 Allergy status to other drugs, medicaments and biological substances status: Secondary | ICD-10-CM

## 2019-06-08 DIAGNOSIS — F3132 Bipolar disorder, current episode depressed, moderate: Secondary | ICD-10-CM | POA: Diagnosis present

## 2019-06-08 DIAGNOSIS — Z20822 Contact with and (suspected) exposure to covid-19: Secondary | ICD-10-CM | POA: Diagnosis present

## 2019-06-08 DIAGNOSIS — S199XXA Unspecified injury of neck, initial encounter: Secondary | ICD-10-CM | POA: Diagnosis not present

## 2019-06-08 DIAGNOSIS — T40601A Poisoning by unspecified narcotics, accidental (unintentional), initial encounter: Secondary | ICD-10-CM | POA: Diagnosis present

## 2019-06-08 DIAGNOSIS — R404 Transient alteration of awareness: Secondary | ICD-10-CM | POA: Diagnosis not present

## 2019-06-08 DIAGNOSIS — R4020 Unspecified coma: Secondary | ICD-10-CM | POA: Diagnosis not present

## 2019-06-08 DIAGNOSIS — R0902 Hypoxemia: Secondary | ICD-10-CM | POA: Diagnosis not present

## 2019-06-08 DIAGNOSIS — J189 Pneumonia, unspecified organism: Secondary | ICD-10-CM | POA: Diagnosis not present

## 2019-06-08 DIAGNOSIS — M5412 Radiculopathy, cervical region: Secondary | ICD-10-CM | POA: Diagnosis present

## 2019-06-08 DIAGNOSIS — Z79899 Other long term (current) drug therapy: Secondary | ICD-10-CM

## 2019-06-08 DIAGNOSIS — E876 Hypokalemia: Secondary | ICD-10-CM | POA: Diagnosis present

## 2019-06-08 DIAGNOSIS — Z21 Asymptomatic human immunodeficiency virus [HIV] infection status: Secondary | ICD-10-CM | POA: Diagnosis present

## 2019-06-08 DIAGNOSIS — R569 Unspecified convulsions: Secondary | ICD-10-CM | POA: Diagnosis not present

## 2019-06-08 DIAGNOSIS — G9341 Metabolic encephalopathy: Secondary | ICD-10-CM | POA: Diagnosis present

## 2019-06-08 DIAGNOSIS — R4182 Altered mental status, unspecified: Secondary | ICD-10-CM | POA: Diagnosis not present

## 2019-06-08 DIAGNOSIS — S3992XA Unspecified injury of lower back, initial encounter: Secondary | ICD-10-CM | POA: Diagnosis not present

## 2019-06-08 DIAGNOSIS — F1410 Cocaine abuse, uncomplicated: Secondary | ICD-10-CM | POA: Diagnosis present

## 2019-06-08 DIAGNOSIS — J9601 Acute respiratory failure with hypoxia: Secondary | ICD-10-CM | POA: Diagnosis not present

## 2019-06-08 DIAGNOSIS — F4310 Post-traumatic stress disorder, unspecified: Secondary | ICD-10-CM | POA: Diagnosis present

## 2019-06-08 DIAGNOSIS — G259 Extrapyramidal and movement disorder, unspecified: Secondary | ICD-10-CM | POA: Diagnosis present

## 2019-06-08 DIAGNOSIS — F609 Personality disorder, unspecified: Secondary | ICD-10-CM | POA: Diagnosis present

## 2019-06-08 DIAGNOSIS — E1165 Type 2 diabetes mellitus with hyperglycemia: Secondary | ICD-10-CM | POA: Diagnosis not present

## 2019-06-08 LAB — COMPREHENSIVE METABOLIC PANEL
ALT: 46 U/L — ABNORMAL HIGH (ref 0–44)
AST: 79 U/L — ABNORMAL HIGH (ref 15–41)
Albumin: 3.5 g/dL (ref 3.5–5.0)
Alkaline Phosphatase: 59 U/L (ref 38–126)
Anion gap: 7 (ref 5–15)
BUN: 20 mg/dL (ref 6–20)
CO2: 21 mmol/L — ABNORMAL LOW (ref 22–32)
Calcium: 8.2 mg/dL — ABNORMAL LOW (ref 8.9–10.3)
Chloride: 108 mmol/L (ref 98–111)
Creatinine, Ser: 1.39 mg/dL — ABNORMAL HIGH (ref 0.44–1.00)
GFR calc Af Amer: 55 mL/min — ABNORMAL LOW (ref >60)
GFR calc non Af Amer: 47 mL/min — ABNORMAL LOW (ref >60)
Glucose, Bld: 271 mg/dL — ABNORMAL HIGH (ref 70–99)
Potassium: 3.3 mmol/L — ABNORMAL LOW (ref 3.5–5.1)
Sodium: 136 mmol/L (ref 135–145)
Total Bilirubin: 0.9 mg/dL (ref 0.3–1.2)
Total Protein: 6.5 g/dL (ref 6.5–8.1)

## 2019-06-08 LAB — CBC WITH DIFFERENTIAL/PLATELET
Abs Immature Granulocytes: 0.05 10*3/uL (ref 0.00–0.07)
Basophils Absolute: 0 10*3/uL (ref 0.0–0.1)
Basophils Relative: 0 %
Eosinophils Absolute: 0 10*3/uL (ref 0.0–0.5)
Eosinophils Relative: 0 %
HCT: 40.4 % (ref 36.0–46.0)
Hemoglobin: 13 g/dL (ref 12.0–15.0)
Immature Granulocytes: 0 %
Lymphocytes Relative: 8 %
Lymphs Abs: 0.9 10*3/uL (ref 0.7–4.0)
MCH: 32.7 pg (ref 26.0–34.0)
MCHC: 32.2 g/dL (ref 30.0–36.0)
MCV: 101.5 fL — ABNORMAL HIGH (ref 80.0–100.0)
Monocytes Absolute: 0.2 10*3/uL (ref 0.1–1.0)
Monocytes Relative: 2 %
Neutro Abs: 10.8 10*3/uL — ABNORMAL HIGH (ref 1.7–7.7)
Neutrophils Relative %: 90 %
Platelets: 358 10*3/uL (ref 150–400)
RBC: 3.98 MIL/uL (ref 3.87–5.11)
RDW: 12.6 % (ref 11.5–15.5)
WBC: 12 10*3/uL — ABNORMAL HIGH (ref 4.0–10.5)
nRBC: 0 % (ref 0.0–0.2)

## 2019-06-08 LAB — RAPID URINE DRUG SCREEN, HOSP PERFORMED
Amphetamines: NOT DETECTED
Barbiturates: NOT DETECTED
Benzodiazepines: POSITIVE — AB
Cocaine: POSITIVE — AB
Opiates: NOT DETECTED
Tetrahydrocannabinol: NOT DETECTED

## 2019-06-08 LAB — BLOOD GAS, ARTERIAL
Acid-base deficit: 6.4 mmol/L — ABNORMAL HIGH (ref 0.0–2.0)
Bicarbonate: 20.1 mmol/L (ref 20.0–28.0)
FIO2: 100
O2 Saturation: 90.2 %
Patient temperature: 98.7
pCO2 arterial: 46 mmHg (ref 32.0–48.0)
pH, Arterial: 7.264 — ABNORMAL LOW (ref 7.350–7.450)
pO2, Arterial: 70.5 mmHg — ABNORMAL LOW (ref 83.0–108.0)

## 2019-06-08 LAB — RESPIRATORY PANEL BY RT PCR (FLU A&B, COVID)
Influenza A by PCR: NEGATIVE
Influenza B by PCR: NEGATIVE
SARS Coronavirus 2 by RT PCR: NEGATIVE

## 2019-06-08 LAB — TSH: TSH: 1.283 u[IU]/mL (ref 0.350–4.500)

## 2019-06-08 LAB — HCG, QUANTITATIVE, PREGNANCY: hCG, Beta Chain, Quant, S: <1 m[IU]/mL (ref <5)

## 2019-06-08 LAB — D-DIMER, QUANTITATIVE: D-Dimer, Quant: 1.89 ug/mL-FEU — ABNORMAL HIGH (ref 0.00–0.50)

## 2019-06-08 LAB — URINALYSIS, ROUTINE W REFLEX MICROSCOPIC
Bilirubin Urine: NEGATIVE
Glucose, UA: >=500 mg/dL — AB
Hgb urine dipstick: NEGATIVE
Ketones, ur: NEGATIVE mg/dL
Leukocytes,Ua: NEGATIVE
Nitrite: NEGATIVE
Protein, ur: NEGATIVE mg/dL
Specific Gravity, Urine: 1.008 (ref 1.005–1.030)
pH: 6 (ref 5.0–8.0)

## 2019-06-08 LAB — CBG MONITORING, ED: Glucose-Capillary: 314 mg/dL — ABNORMAL HIGH (ref 70–99)

## 2019-06-08 LAB — BRAIN NATRIURETIC PEPTIDE: B Natriuretic Peptide: 62.4 pg/mL (ref 0.0–100.0)

## 2019-06-08 LAB — ETHANOL: Alcohol, Ethyl (B): <10 mg/dL (ref <10)

## 2019-06-08 LAB — MAGNESIUM: Magnesium: 2.1 mg/dL (ref 1.7–2.4)

## 2019-06-08 LAB — BETA-HYDROXYBUTYRIC ACID: Beta-Hydroxybutyric Acid: 0.13 mmol/L (ref 0.05–0.27)

## 2019-06-08 LAB — SALICYLATE LEVEL: Salicylate Lvl: <7 mg/dL — ABNORMAL LOW (ref 7.0–30.0)

## 2019-06-08 LAB — ACETAMINOPHEN LEVEL: Acetaminophen (Tylenol), Serum: <10 ug/mL — ABNORMAL LOW (ref 10–30)

## 2019-06-08 LAB — TROPONIN I (HIGH SENSITIVITY)
Troponin I (High Sensitivity): 75 ng/L — ABNORMAL HIGH (ref <18)
Troponin I (High Sensitivity): 372 ng/L (ref <18)

## 2019-06-08 LAB — PREGNANCY, URINE: Preg Test, Ur: NEGATIVE

## 2019-06-08 MED ORDER — POTASSIUM CHLORIDE CRYS ER 20 MEQ PO TBCR
40.0000 meq | EXTENDED_RELEASE_TABLET | Freq: Once | ORAL | Status: AC
  Administered 2019-06-08: 40 meq via ORAL
  Filled 2019-06-08: qty 2

## 2019-06-08 MED ORDER — NAPROXEN 250 MG PO TABS
500.0000 mg | ORAL_TABLET | Freq: Two times a day (BID) | ORAL | Status: DC
  Administered 2019-06-09 – 2019-06-13 (×9): 500 mg via ORAL
  Filled 2019-06-08 (×11): qty 2

## 2019-06-08 MED ORDER — NALOXONE HCL 0.4 MG/ML IJ SOLN
0.4000 mg | Freq: Once | INTRAMUSCULAR | Status: AC
  Administered 2019-06-08: 12:00:00 0.4 mg via INTRAVENOUS
  Filled 2019-06-08: qty 1

## 2019-06-08 MED ORDER — VALACYCLOVIR HCL 500 MG PO TABS
500.0000 mg | ORAL_TABLET | Freq: Every day | ORAL | Status: DC
  Administered 2019-06-09 – 2019-06-13 (×6): 500 mg via ORAL
  Filled 2019-06-08 (×6): qty 1

## 2019-06-08 MED ORDER — SODIUM CHLORIDE (PF) 0.9 % IJ SOLN
INTRAMUSCULAR | Status: AC
  Filled 2019-06-08: qty 50

## 2019-06-08 MED ORDER — PANTOPRAZOLE SODIUM 40 MG PO TBEC
40.0000 mg | DELAYED_RELEASE_TABLET | Freq: Every day | ORAL | Status: DC
  Administered 2019-06-09 – 2019-06-13 (×6): 40 mg via ORAL
  Filled 2019-06-08 (×6): qty 1

## 2019-06-08 MED ORDER — ONDANSETRON HCL 4 MG PO TABS: 4.0000 mg | ORAL_TABLET | Freq: Four times a day (QID) | ORAL | Status: DC | PRN

## 2019-06-08 MED ORDER — ALBUTEROL SULFATE (2.5 MG/3ML) 0.083% IN NEBU
2.5000 mg | INHALATION_SOLUTION | Freq: Three times a day (TID) | RESPIRATORY_TRACT | Status: DC
  Administered 2019-06-09 – 2019-06-10 (×3): 2.5 mg via RESPIRATORY_TRACT
  Filled 2019-06-08 (×3): qty 3

## 2019-06-08 MED ORDER — SERTRALINE HCL 50 MG PO TABS
150.0000 mg | ORAL_TABLET | Freq: Every day | ORAL | Status: DC
  Administered 2019-06-09 – 2019-06-13 (×6): 150 mg via ORAL
  Filled 2019-06-08 (×6): qty 1

## 2019-06-08 MED ORDER — ENOXAPARIN SODIUM 40 MG/0.4ML ~~LOC~~ SOLN
40.0000 mg | SUBCUTANEOUS | Status: DC
  Administered 2019-06-08 – 2019-06-12 (×5): 40 mg via SUBCUTANEOUS
  Filled 2019-06-08 (×5): qty 0.4

## 2019-06-08 MED ORDER — ALBUTEROL SULFATE (2.5 MG/3ML) 0.083% IN NEBU: 2.5000 mg | INHALATION_SOLUTION | RESPIRATORY_TRACT | Status: DC | PRN

## 2019-06-08 MED ORDER — PIPERACILLIN-TAZOBACTAM 3.375 G IVPB
3.3750 g | Freq: Three times a day (TID) | INTRAVENOUS | Status: DC
  Administered 2019-06-09 – 2019-06-11 (×7): 3.375 g via INTRAVENOUS
  Filled 2019-06-08 (×10): qty 50

## 2019-06-08 MED ORDER — BENZTROPINE MESYLATE 1 MG PO TABS
2.0000 mg | ORAL_TABLET | Freq: Every day | ORAL | Status: DC
  Administered 2019-06-09 – 2019-06-13 (×6): 2 mg via ORAL
  Filled 2019-06-08 (×6): qty 2

## 2019-06-08 MED ORDER — SODIUM CHLORIDE 0.9 % IV BOLUS
1000.0000 mL | Freq: Once | INTRAVENOUS | Status: AC
  Administered 2019-06-08: 13:00:00 1000 mL via INTRAVENOUS

## 2019-06-08 MED ORDER — IOHEXOL 350 MG/ML SOLN: 100.0000 mL | Freq: Once | INTRAVENOUS | Status: DC | PRN

## 2019-06-08 MED ORDER — SODIUM CHLORIDE 0.9 % IV SOLN: INTRAVENOUS | Status: DC

## 2019-06-08 MED ORDER — BICTEGRAVIR-EMTRICITAB-TENOFOV 50-200-25 MG PO TABS
1.0000 | ORAL_TABLET | Freq: Every day | ORAL | Status: DC
  Administered 2019-06-08 – 2019-06-13 (×6): 1 via ORAL
  Filled 2019-06-08 (×6): qty 1

## 2019-06-08 MED ORDER — IOHEXOL 350 MG/ML SOLN
100.0000 mL | Freq: Once | INTRAVENOUS | Status: AC | PRN
  Administered 2019-06-08: 14:00:00 100 mL via INTRAVENOUS

## 2019-06-08 MED ORDER — GUAIFENESIN ER 600 MG PO TB12
600.0000 mg | ORAL_TABLET | Freq: Two times a day (BID) | ORAL | Status: DC
  Administered 2019-06-08 – 2019-06-13 (×10): 600 mg via ORAL
  Filled 2019-06-08 (×10): qty 1

## 2019-06-08 MED ORDER — PALIPERIDONE ER 3 MG PO TB24
3.0000 mg | ORAL_TABLET | Freq: Every day | ORAL | Status: DC
  Filled 2019-06-08 (×2): qty 1

## 2019-06-08 MED ORDER — AMPHETAMINE-DEXTROAMPHETAMINE 20 MG PO TABS
20.0000 mg | ORAL_TABLET | Freq: Three times a day (TID) | ORAL | Status: DC
  Filled 2019-06-08: qty 1

## 2019-06-08 MED ORDER — ONDANSETRON HCL 4 MG/2ML IJ SOLN: 4.0000 mg | Freq: Four times a day (QID) | INTRAMUSCULAR | Status: DC | PRN

## 2019-06-08 MED ORDER — ALPRAZOLAM 1 MG PO TABS
1.0000 mg | ORAL_TABLET | Freq: Two times a day (BID) | ORAL | Status: DC | PRN
  Administered 2019-06-08 – 2019-06-13 (×7): 1 mg via ORAL
  Filled 2019-06-08 (×7): qty 1

## 2019-06-08 MED ORDER — ALBUTEROL SULFATE (2.5 MG/3ML) 0.083% IN NEBU
2.5000 mg | INHALATION_SOLUTION | Freq: Four times a day (QID) | RESPIRATORY_TRACT | Status: DC
  Administered 2019-06-08: 23:00:00 2.5 mg via RESPIRATORY_TRACT
  Filled 2019-06-08: qty 3

## 2019-06-08 MED ORDER — BISACODYL 10 MG RE SUPP: 10.0000 mg | Freq: Every day | RECTAL | Status: DC | PRN

## 2019-06-08 MED ORDER — POLYETHYLENE GLYCOL 3350 17 G PO PACK: 17.0000 g | PACK | Freq: Every day | ORAL | Status: DC | PRN

## 2019-06-08 MED ORDER — GABAPENTIN 300 MG PO CAPS
300.0000 mg | ORAL_CAPSULE | Freq: Three times a day (TID) | ORAL | Status: DC
  Administered 2019-06-08 – 2019-06-13 (×15): 300 mg via ORAL
  Filled 2019-06-08 (×15): qty 1

## 2019-06-08 MED ORDER — SODIUM CHLORIDE 0.9 % IV SOLN
1.0000 g | Freq: Once | INTRAVENOUS | Status: AC
  Administered 2019-06-08: 16:00:00 1 g via INTRAVENOUS
  Filled 2019-06-08: qty 10

## 2019-06-08 MED ORDER — SODIUM CHLORIDE 0.9 % IV SOLN
500.0000 mg | Freq: Once | INTRAVENOUS | Status: AC
  Administered 2019-06-08: 16:00:00 500 mg via INTRAVENOUS
  Filled 2019-06-08: qty 500
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
## 2019-06-08 NOTE — ED Triage Notes (Signed)
EMS reports coming from hotel room, called out for seizure, there with friends stated she had a seizure with tremors. Upon EMS arrival Pt lethargic and not responding, EMS administered 05mg  IM and 0.5mg  IV Narcan with response after Pt became alert and agitated.  BP 90/68 HR 120 RR 20 Sp02 80 non rebreather at 10ltrs CBG 482  20 LAC  516ml NS enroute

## 2019-06-08 NOTE — ED Notes (Signed)
Provider notified of lab result troponin 372

## 2019-06-08 NOTE — Progress Notes (Signed)
Notified Lab ABG being sent for analysis. 

## 2019-06-08 NOTE — Progress Notes (Signed)
Pharmacy Antibiotic Note  Yolanda Jones is a 39 y.o. female admitted on 06/08/2019 with  polysubstance abuse, bipolar disorder, anxiety, history of HIV, presented to hospital after sustaining a seizure-like episode witnessed by the patient's friends.  Pharmacy has been consulted for zosyn dosing for aspiration pna.  Plan: Zosyn 3.375g IV Q8H infused over 4hrs. Follow renal function and clinical course  Height: 5' (152.4 cm) Weight: 68 kg (150 lb) IBW/kg (Calculated) : 45.5  Temp (24hrs), Avg:98.4 F (36.9 C), Min:98.4 F (36.9 C), Max:98.4 F (36.9 C)  Recent Labs  Lab 06/08/19 1222  WBC 12.0*  CREATININE 1.39*    Estimated Creatinine Clearance: 46.3 mL/min (A) (by C-G formula based on SCr of 1.39 mg/dL (H)).    Allergies  Allergen Reactions  . Gabapentin     Pt denies, but doesn't like the way she feels- drunk, sluggish  . Naproxen   . Baclofen     Mumbled speech, AMD  . Clindamycin/Lincomycin Other (See Comments)    vomiting  . Meloxicam Other (See Comments)    Swelling in face   . Oxaprozin Other (See Comments)    Swelling in face   . Prednisone Other (See Comments)    homicidal  . Strattera [Atomoxetine Hcl] Swelling  . Tramadol Other (See Comments)    Seizure     Antimicrobials this admission: 4/28 azith x 1 4/28 CTX x1 4/29 zosyn >>  Thank you for allowing pharmacy to be a part of this patient's care.  Dolly Rias RPh 06/08/2019, 9:05 PM
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
## 2019-06-09 ENCOUNTER — Observation Stay (HOSPITAL_COMMUNITY): Payer: Medicare Other

## 2019-06-09 DIAGNOSIS — F1410 Cocaine abuse, uncomplicated: Secondary | ICD-10-CM | POA: Diagnosis present

## 2019-06-09 DIAGNOSIS — R7881 Bacteremia: Secondary | ICD-10-CM | POA: Diagnosis not present

## 2019-06-09 DIAGNOSIS — G9341 Metabolic encephalopathy: Secondary | ICD-10-CM | POA: Diagnosis present

## 2019-06-09 DIAGNOSIS — J9601 Acute respiratory failure with hypoxia: Secondary | ICD-10-CM

## 2019-06-09 DIAGNOSIS — E876 Hypokalemia: Secondary | ICD-10-CM | POA: Diagnosis present

## 2019-06-09 DIAGNOSIS — Z21 Asymptomatic human immunodeficiency virus [HIV] infection status: Secondary | ICD-10-CM | POA: Diagnosis present

## 2019-06-09 DIAGNOSIS — Z20822 Contact with and (suspected) exposure to covid-19: Secondary | ICD-10-CM | POA: Diagnosis present

## 2019-06-09 DIAGNOSIS — B009 Herpesviral infection, unspecified: Secondary | ICD-10-CM | POA: Diagnosis present

## 2019-06-09 DIAGNOSIS — Z888 Allergy status to other drugs, medicaments and biological substances status: Secondary | ICD-10-CM | POA: Diagnosis not present

## 2019-06-09 DIAGNOSIS — F609 Personality disorder, unspecified: Secondary | ICD-10-CM | POA: Diagnosis present

## 2019-06-09 DIAGNOSIS — R569 Unspecified convulsions: Secondary | ICD-10-CM | POA: Diagnosis present

## 2019-06-09 DIAGNOSIS — B20 Human immunodeficiency virus [HIV] disease: Secondary | ICD-10-CM | POA: Diagnosis not present

## 2019-06-09 DIAGNOSIS — M5412 Radiculopathy, cervical region: Secondary | ICD-10-CM | POA: Diagnosis present

## 2019-06-09 DIAGNOSIS — I38 Endocarditis, valve unspecified: Secondary | ICD-10-CM | POA: Diagnosis not present

## 2019-06-09 DIAGNOSIS — F3132 Bipolar disorder, current episode depressed, moderate: Secondary | ICD-10-CM | POA: Diagnosis present

## 2019-06-09 DIAGNOSIS — F1910 Other psychoactive substance abuse, uncomplicated: Secondary | ICD-10-CM | POA: Diagnosis present

## 2019-06-09 DIAGNOSIS — T40601A Poisoning by unspecified narcotics, accidental (unintentional), initial encounter: Secondary | ICD-10-CM | POA: Diagnosis present

## 2019-06-09 DIAGNOSIS — I371 Nonrheumatic pulmonary valve insufficiency: Secondary | ICD-10-CM | POA: Diagnosis not present

## 2019-06-09 DIAGNOSIS — J189 Pneumonia, unspecified organism: Secondary | ICD-10-CM | POA: Diagnosis present

## 2019-06-09 DIAGNOSIS — F4310 Post-traumatic stress disorder, unspecified: Secondary | ICD-10-CM | POA: Diagnosis present

## 2019-06-09 DIAGNOSIS — R739 Hyperglycemia, unspecified: Secondary | ICD-10-CM | POA: Diagnosis present

## 2019-06-09 DIAGNOSIS — G259 Extrapyramidal and movement disorder, unspecified: Secondary | ICD-10-CM | POA: Diagnosis present

## 2019-06-09 DIAGNOSIS — Z79899 Other long term (current) drug therapy: Secondary | ICD-10-CM | POA: Diagnosis not present

## 2019-06-09 DIAGNOSIS — I081 Rheumatic disorders of both mitral and tricuspid valves: Secondary | ICD-10-CM | POA: Diagnosis not present

## 2019-06-09 LAB — CD4/CD8 (T-HELPER/T-SUPPRESSOR CELL)
CD4 absolute: 148 /uL — ABNORMAL LOW (ref 400–1790)
CD4%: 40 % (ref 33–65)
CD8 T Cell Abs: 152 /uL — ABNORMAL LOW (ref 190–1000)
CD8tox: 41 % — ABNORMAL HIGH (ref 12–40)
Ratio: 0.98 — ABNORMAL LOW (ref 1.0–3.0)
Total lymphocyte count: 370 /uL — ABNORMAL LOW (ref 1000–4000)

## 2019-06-09 LAB — ECHOCARDIOGRAM COMPLETE
Height: 60 in
Weight: 2493.84 oz

## 2019-06-09 LAB — COMPREHENSIVE METABOLIC PANEL
ALT: 33 U/L (ref 0–44)
AST: 37 U/L (ref 15–41)
Albumin: 3 g/dL — ABNORMAL LOW (ref 3.5–5.0)
Alkaline Phosphatase: 40 U/L (ref 38–126)
Anion gap: 8 (ref 5–15)
BUN: 22 mg/dL — ABNORMAL HIGH (ref 6–20)
CO2: 19 mmol/L — ABNORMAL LOW (ref 22–32)
Calcium: 7.9 mg/dL — ABNORMAL LOW (ref 8.9–10.3)
Chloride: 114 mmol/L — ABNORMAL HIGH (ref 98–111)
Creatinine, Ser: 1.05 mg/dL — ABNORMAL HIGH (ref 0.44–1.00)
GFR calc Af Amer: >60 mL/min (ref >60)
GFR calc non Af Amer: >60 mL/min (ref >60)
Glucose, Bld: 109 mg/dL — ABNORMAL HIGH (ref 70–99)
Potassium: 4.3 mmol/L (ref 3.5–5.1)
Sodium: 141 mmol/L (ref 135–145)
Total Bilirubin: 1.2 mg/dL (ref 0.3–1.2)
Total Protein: 5.8 g/dL — ABNORMAL LOW (ref 6.5–8.1)

## 2019-06-09 LAB — TROPONIN I (HIGH SENSITIVITY): Troponin I (High Sensitivity): 161 ng/L (ref <18)

## 2019-06-09 LAB — CBC
HCT: 37.4 % (ref 36.0–46.0)
Hemoglobin: 11.9 g/dL — ABNORMAL LOW (ref 12.0–15.0)
MCH: 32.9 pg (ref 26.0–34.0)
MCHC: 31.8 g/dL (ref 30.0–36.0)
MCV: 103.3 fL — ABNORMAL HIGH (ref 80.0–100.0)
Platelets: 247 10*3/uL (ref 150–400)
RBC: 3.62 MIL/uL — ABNORMAL LOW (ref 3.87–5.11)
RDW: 13 % (ref 11.5–15.5)
WBC: 8.7 10*3/uL (ref 4.0–10.5)
nRBC: 0 % (ref 0.0–0.2)

## 2019-06-09 LAB — MAGNESIUM: Magnesium: 1.9 mg/dL (ref 1.7–2.4)

## 2019-06-09 LAB — HEMOGLOBIN A1C
Hgb A1c MFr Bld: 5.4 % (ref 4.8–5.6)
Mean Plasma Glucose: 108.28 mg/dL

## 2019-06-09 LAB — PROTIME-INR
INR: 1 (ref 0.8–1.2)
Prothrombin Time: 13.1 seconds (ref 11.4–15.2)

## 2019-06-09 MED ORDER — ACETAMINOPHEN 325 MG PO TABS
650.0000 mg | ORAL_TABLET | Freq: Four times a day (QID) | ORAL | Status: DC | PRN
  Administered 2019-06-10 – 2019-06-11 (×3): 650 mg via ORAL
  Filled 2019-06-09 (×4): qty 2

## 2019-06-09 MED ORDER — SULFAMETHOXAZOLE-TRIMETHOPRIM 800-160 MG PO TABS
1.0000 | ORAL_TABLET | Freq: Every day | ORAL | Status: DC
  Administered 2019-06-09 – 2019-06-10 (×2): 1 via ORAL
  Filled 2019-06-09 (×2): qty 1

## 2019-06-09 MED ORDER — SODIUM CHLORIDE 0.9 % IV BOLUS
1000.0000 mL | Freq: Once | INTRAVENOUS | Status: AC
  Administered 2019-06-09: 16:00:00 1000 mL via INTRAVENOUS

## 2019-06-09 MED ORDER — CARIPRAZINE HCL 1.5 MG PO CAPS
4.5000 mg | ORAL_CAPSULE | Freq: Every day | ORAL | Status: DC
  Administered 2019-06-09 – 2019-06-13 (×5): 4.5 mg via ORAL
  Filled 2019-06-09 (×5): qty 3

## 2019-06-09 NOTE — TOC Initial Note (Addendum)
Transition of Care [facility]) - Initial/Assessment Note    Patient Details  Name: Yolanda Jones MRN: [id number] Date of Birth: [birth year]  Transition of [facility] CM/SW Contact:    Trish Mage, LCSW Phone Number: 06/09/2019, 11:37 AM  Clinical Narrative:  Met with patient in response to MD consult for SA.  Yolanda Jones was open to talking to me about her needs and goals, presented as anxious and fidgety with constant moving of her extremities throughout the interview. She admits to a recent relapse on cocaine and mental health diagnoses of Bipolar D/O, PTSD, Anxiety and ADHD, for which she states she is currently being treated but prefers not to tell me who her psychiatrist nor therapist is and nor let them know of her current situation.  She states she is disabled based on her mental health diagnosis.  Yolanda Jones is planning on staying with "sober friends" upon release from the hospital before reuniting with "partner."  Although she is living in [place], she is adamant that [facility] in [place] has a telehealth outpatient mental health program for which she would qualify, and wants me to check into that for her, which I agreed to do.  She will likely be referrred to other resources by me after that door closes. TOC will continue to follow during the course of hospitalization.  Addendum:  I stand corrected.  [facility] will see patient despite [place] address. Info given to patient.  No further needs identified.     Expected Discharge Plan: Home/Self Care Barriers to Discharge: No Barriers Identified   Patient Goals and CMS Choice Patient states their goals for this hospitalization and ongoing recovery are:: "I relapsed and need help getting into a telehealth SA IOP program."      Expected Discharge Plan and Services Expected Discharge Plan: Home/Self Care In-house Referral: Clinical Social Work     Living arrangements for the past 2 months: Apartment                                       Prior Living Arrangements/Services Living arrangements for the past 2 months: Apartment Lives with:: Significant Other Patient language and need for interpreter reviewed:: Yes        Need for Family Participation in Patient Care: No (Comment) Care giver support system in place?: Yes (comment)   Criminal Activity/Legal Involvement Pertinent to Current Situation/Hospitalization: No - Comment as needed  Activities of Daily Living Home Assistive Devices/Equipment: Other (Comment)(unknown, pt isn't able to answer this) ADL Screening (condition at time of admission) Patient's cognitive ability adequate to safely complete daily activities?: No(pt argumentive with staff currently and pulling oxygen off) Is the patient deaf or have difficulty hearing?: No Does the patient have difficulty seeing, even when wearing glasses/contacts?: No Does the patient have difficulty concentrating, remembering, or making decisions?: Yes(currently) Patient able to express need for assistance with ADLs?: Yes Weakness of Legs: None Weakness of Arms/Hands: None  Permission Sought/Granted                  Emotional Assessment Appearance:: Appears stated age Attitude/Demeanor/Rapport: Engaged Affect (typically observed): Anxious Orientation: : Oriented to Self, Oriented to Place, Oriented to  Time, Oriented to Situation Alcohol / Substance Use: Illicit Drugs Psych Involvement: Yes (comment)  Admission diagnosis:  Polysubstance abuse ([place]) [F19.10] Acute respiratory failure with hypoxia ([place]) [J96.01] Opiate overdose, accidental or unintentional, initial encounter ([place]) [T40.601A]  Acute hypoxemic respiratory failure (HCC) [J96.01] Multifocal pneumonia [J18.9] Patient Active Problem List   Diagnosis Date Noted  . Acute hypoxemic respiratory failure ([place]) 06/08/2019  . Hypokalemia 06/08/2019  . Polysubstance abuse ([place]) 06/08/2019  . Vaccine counseling 10/05/2017  . Radiculopathy of  cervical spine 06/23/2017  . Sprain of anterior talofibular ligament of right ankle 05/29/2017  . Drug-seeking behavior 05/26/2017  . Medication monitoring encounter 09/22/2016  . Brachial plexus dysfunction 10/08/2015  . Chronic renal insufficiency, stage II (mild) 07/04/2015  . Encounter for long-term (current) use of medications 08/17/2014  . 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 Weakness of left arm 06/07/2013  . Abnormal Pap smear of cervix 12/14/2012  . Weight gain due to medication 07/15/2012  . H/O hyperprolactinemia   . Human immunodeficiency virus (HIV) disease ([place]) 12/13/2010  . 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 PTSD (post-traumatic stress disorder) 12/13/2010  . Bipolar 1 disorder, depressed, moderate ([place]) 12/13/2010  . Personality disorder ([place]) 12/13/2010  . 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 HYPERTHYROIDISM 10/08/2006   PCP:  Patient, No Pcp Per Pharmacy:   [facility] [place], [address] [place] [address] Phone: [phone] Fax: [phone]     Social Determinants of Health (SDOH) Interventions    Readmission Risk Interventions No flowsheet data found.

## 2019-06-09 NOTE — Progress Notes (Signed)
  Echocardiogram 2D Echocardiogram has been performed.  Jannett Celestine 06/09/2019, 11:03 AM

## 2019-06-09 NOTE — Plan of Care (Signed)
°  Problem: Coping: °Goal: Level of anxiety will decrease °Outcome: Progressing °  °

## 2019-06-09 NOTE — Progress Notes (Signed)
PROGRESS NOTE    SHADAI REDDISH  [id number] DOB: [birth year] DOA: 06/08/2019 PCP: Patient, No Pcp Per   Brief Narrative: [name] is a 39 y.o. female with polysubstance abuse, bipolar disorder, anxiety, history of HIV. Patient presented secondary to concern for seizures in the setting of substance use and found to have multifocal pneumonia.   Assessment & Plan:   Principal Problem:   Acute hypoxemic respiratory failure (HCC) Active Problems:   Human immunodeficiency virus (HIV) disease (HCC)   PTSD (post-traumatic stress disorder)   Bipolar 1 disorder, depressed, moderate (HCC)   Hypokalemia   Polysubstance abuse ([place])   Acute respiratory failure with hypoxia Secondary to multifocal pneumonia. -Wean to room air -Goal SpO2 of >92%  Multifocal pneumonia Concern for aspiration on admission secondary to seizure history and substance use. Given Ceftriaxone and Azithromycin in the ED and transitioned to Zosyn IV. -Continue Zosyn IV  Possible seizure Witnessed by family. In setting of acute intoxication with possible fentanyl but patient is unsure of specific drug.  Metabolic vs toxic encephalopathy Possible post-ictal vs toxic from drug use. CT head negative. Resolved.  Hyperglycemia Hemoglobin A1C of 5.4%  Elevated creatinine Unknown baseline. Improvement with IV fluids.  Elevated LFTs Resolved.  HIV CD4 count is 148 currently, previous of 911 in November of 2020. Viral load in 12/2018 undetectable. -Continue Biktarvy -Start Bactrim DS daily for prophylaxis  HSV -Continue Valacyclovir  Elevated troponin Likely related to possible seizure. No chest pain. EKG without concerning ACS changes. Trending down.   DVT prophylaxis: Lovenox Code Status:   Code Status: Full Code Family Communication: None at bedside. Patient request for me to not discuss her care with family/friends Disposition Plan: Discharge home likely in 24 hours   Consultants:    None  Procedures:   None  Antimicrobials:  Ceftriaxone  Azithromycin  Zosyn    Subjective: Cough. No other issues overnight.  Objective: Vitals:   06/09/19 0050 06/09/19 0602 06/09/19 0821 06/09/19 0828  BP: (!) 92/54 (!) 95/57  (!) 86/59  Pulse: 89 88  84  Resp: 20 18  18   Temp: 97.6 F (36.4 C) 98 F (36.7 C)  98.5 F (36.9 C)  TempSrc: Oral Oral  Oral  SpO2: 92% 91% 93% 99%  Weight:  70.7 kg    Height:        Intake/Output Summary (Last 24 hours) at 06/09/2019 1208 Last data filed at 06/09/2019 1000 Gross per 24 hour  Intake 1625.28 ml  Output 350 ml  Net 1275.28 ml   Filed Weights   06/08/19 1954 06/08/19 2241 06/09/19 0602  Weight: 68 kg 74.7 kg 70.7 kg    Examination:  General exam: Appears calm and comfortable Respiratory system: Clear to auscultation. Respiratory effort normal. Cardiovascular system: S1 & S2 heard, RRR. No murmurs, rubs, gallops or clicks. Gastrointestinal system: Abdomen is nondistended, soft and nontender. No organomegaly or masses felt. Normal bowel sounds heard. Central nervous system: Alert and oriented. No focal neurological deficits. Extremities: No edema. No calf tenderness Skin: No cyanosis. No rashes Psychiatry: Judgement and insight appear normal. Anxious appearing.    Data Reviewed: I have personally reviewed following labs and imaging studies  CBC: Recent Labs  Lab 06/08/19 1222 06/09/19 0543  WBC 12.0* 8.7  NEUTROABS 10.8*  --   HGB 13.0 11.9*  HCT 40.4 37.4  MCV 101.5* 103.3*  PLT 358 [id number]   Basic Metabolic Panel: Recent Labs  Lab 06/08/19 1222 06/08/19 1237 06/09/19 0543  NA 136  --  141  K 3.3*  --  4.3  CL 108  --  114*  CO2 21*  --  19*  GLUCOSE 271*  --  109*  BUN 20  --  22*  CREATININE 1.39*  --  1.05*  CALCIUM 8.2*  --  7.9*  MG  --  2.1 1.9   GFR: Estimated Creatinine Clearance: 62.5 mL/min (A) (by C-G formula based on SCr of 1.05 mg/dL (H)). Liver Function Tests: Recent Labs   Lab 06/08/19 1222 06/09/19 0543  AST 79* 37  ALT 46* 33  ALKPHOS 59 40  BILITOT 0.9 1.2  PROT 6.5 5.8*  ALBUMIN 3.5 3.0*   No results for input(s): LIPASE, AMYLASE in the last 168 hours. No results for input(s): AMMONIA in the last 168 hours. Coagulation Profile: Recent Labs  Lab 06/09/19 0543  INR 1.0   Cardiac Enzymes: No results for input(s): CKTOTAL, CKMB, CKMBINDEX, TROPONINI in the last 168 hours. BNP (last 3 results) No results for input(s): PROBNP in the last 8760 hours. HbA1C: Recent Labs    06/08/19 2059  HGBA1C 5.4   CBG: Recent Labs  Lab 06/08/19 1208  GLUCAP 314*   Lipid Profile: No results for input(s): CHOL, HDL, LDLCALC, TRIG, CHOLHDL, LDLDIRECT in the last 72 hours. Thyroid Function Tests: Recent Labs    06/08/19 2248  TSH 1.283   Anemia Panel: No results for input(s): VITAMINB12, FOLATE, FERRITIN, TIBC, IRON, RETICCTPCT in the last 72 hours. Sepsis Labs: No results for input(s): PROCALCITON, LATICACIDVEN in the last 168 hours.  Recent Results (from the past 240 hour(s))  Respiratory Panel by RT PCR (Flu A&B, Covid) - Nasopharyngeal Swab     Status: None   Collection Time: 06/08/19  4:01 PM   Specimen: Nasopharyngeal Swab  Result Value Ref Range Status   SARS Coronavirus 2 by RT PCR NEGATIVE NEGATIVE Final    Comment: (NOTE) SARS-CoV-2 target nucleic acids are NOT DETECTED. The SARS-CoV-2 RNA is generally detectable in upper respiratoy specimens during the acute phase of infection. The lowest concentration of SARS-CoV-2 viral copies this assay can detect is 131 copies/mL. A negative result does not preclude SARS-Cov-2 infection and should not be used as the sole basis for treatment or other patient management decisions. A negative result may occur with  improper specimen collection/handling, submission of specimen other than nasopharyngeal swab, presence of viral mutation(s) within the areas targeted by this assay, and inadequate  number of viral copies (<131 copies/mL). A negative result must be combined with clinical observations, patient history, and epidemiological information. The expected result is Negative. Fact Sheet for Patients:  PinkCheek.be Fact Sheet for Healthcare Providers:  GravelBags.it This test is not yet ap proved or cleared by the [place] FDA and  has been authorized for detection and/or diagnosis of SARS-CoV-2 by FDA under an Emergency Use Authorization (EUA). This EUA will remain  in effect (meaning this test can be used) for the duration of the COVID-19 declaration under Section 564(b)(1) of the Act, 21 U.S.C. section 360bbb-3(b)(1), unless the authorization is terminated or revoked sooner.    Influenza A by PCR NEGATIVE NEGATIVE Final   Influenza B by PCR NEGATIVE NEGATIVE Final    Comment: (NOTE) The Xpert Xpress SARS-CoV-2/FLU/RSV assay is intended as an aid in  the diagnosis of influenza from Nasopharyngeal swab specimens and  should not be used as a sole basis for treatment. Nasal washings and  aspirates are unacceptable for Xpert Xpress SARS-CoV-2/FLU/RSV  testing. Fact Sheet for Patients: PinkCheek.be  Fact Sheet for Healthcare Providers: GravelBags.it This test is not yet approved or cleared by the [place] FDA and  has been authorized for detection and/or diagnosis of SARS-CoV-2 by  FDA under an Emergency Use Authorization (EUA). This EUA will remain  in effect (meaning this test can be used) for the duration of the  Covid-19 declaration under Section 564(b)(1) of the Act, 21  U.S.C. section 360bbb-3(b)(1), unless the authorization is  terminated or revoked. Performed at [facility], [place] [address], [place], [place] [address]          Radiology Studies: CT Head Wo Contrast  Result Date: 06/08/2019 CLINICAL DATA:  Possible  seizure, altered mental status EXAM: CT HEAD WITHOUT CONTRAST TECHNIQUE: Contiguous axial images were obtained from the base of the skull through the vertex without intravenous contrast. COMPARISON:  Correlation made with 2020 brain MRI FINDINGS: Brain: There is no acute intracranial hemorrhage, mass effect, or edema. Gray-white differentiation is preserved. There is no extra-axial fluid collection. Ventricles and sulci are within normal limits in size and configuration. Vascular: No hyperdense vessel or unexpected calcification. Skull: Calvarium is unremarkable. Sinuses/Orbits: Nonspecific small left maxillary sinus air-fluid level is partially imaged. Orbits are unremarkable. Other: Mastoid air cells are clear. IMPRESSION: No acute intracranial hemorrhage, mass effect, or evidence of acute infarction. Electronically Signed   By: Macy Mis M.D.   On: 06/08/2019 14:40   CT Angio Chest PE W and/or Wo Contrast  Result Date: 06/08/2019 CLINICAL DATA:  Seizure, hypoxia EXAM: CT ANGIOGRAPHY CHEST WITH CONTRAST TECHNIQUE: Multidetector CT imaging of the chest was performed using the standard protocol during bolus administration of intravenous contrast. Multiplanar CT image reconstructions and MIPs were obtained to evaluate the vascular anatomy. CONTRAST:  139mL OMNIPAQUE IOHEXOL 350 MG/ML SOLN COMPARISON:  04/29/2018 FINDINGS: Cardiovascular: Satisfactory opacification of the pulmonary arteries to the proximal segmental level. No evidence of pulmonary embolism. Normal heart size. No pericardial effusion. Mediastinum/Nodes: No enlarged lymph nodes identified. Visualized thyroid is unremarkable, noting presence of artifact. Lungs/Pleura: Dense consolidation in the lower lobes bilaterally with additional involvement of bilateral upper lobes. No pleural effusion. No pneumothorax. Upper Abdomen: Cholecystectomy clips.  No acute abnormality. Musculoskeletal: No acute osseous abnormality. Apparent fracture of the  anterolateral right fourth rib is favored to reflect motion artifact. Review of the MIP images confirms the above findings. IMPRESSION: No evidence of acute pulmonary embolism. Multifocal pneumonia. Electronically Signed   By: Macy Mis M.D.   On: 06/08/2019 14:36   CT Cervical Spine Wo Contrast  Result Date: 06/08/2019 CLINICAL DATA:  Fall, possible seizure EXAM: CT CERVICAL SPINE WITHOUT CONTRAST TECHNIQUE: Multidetector CT imaging of the cervical spine was performed without intravenous contrast. Multiplanar CT image reconstructions were also generated. COMPARISON:  None. FINDINGS: Alignment: Anteroposterior alignment is maintained. Skull base and vertebrae: There is no acute cervical spine fracture. Soft tissues and spinal canal: No prevertebral fluid or swelling. No visible canal hematoma. Disc levels: Minor degenerative changes are present, greatest at C4-C5 and C5-C6. Upper chest: Refer to concurrent dedicated chest imaging. Other: None. IMPRESSION: No acute cervical spine fracture. Electronically Signed   By: Macy Mis M.D.   On: 06/08/2019 14:42   CT Lumbar Spine Wo Contrast  Result Date: 06/08/2019 CLINICAL DATA:  Seizure, fall, low back pain EXAM: CT LUMBAR SPINE WITHOUT CONTRAST TECHNIQUE: Multidetector CT imaging of the lumbar spine was performed without intravenous contrast administration. Multiplanar CT image reconstructions were also generated. COMPARISON:  None. FINDINGS: Segmentation: 5 lumbar type  vertebrae. Alignment: Anteroposterior alignment is maintained. Vertebrae: There is no acute fracture. No destructive osseous lesion. Paraspinal and other soft tissues: Negative. Disc levels: Probable disc bulge with small right foraminal protrusion, endplate osteophytic ridging, and facet hypertrophy at L4-L5 resulting in left foraminal stenosis. There is vacuum disc phenomenon at L5-S1 with disc bulge, endplate osteophytic ridging, and facet hypertrophy resulting in right greater than  left foraminal stenosis. No apparent canal stenosis at any level. IMPRESSION: No acute lumbar spine fracture. Mild lower lumbar degenerative changes. Electronically Signed   By: Macy Mis M.D.   On: 06/08/2019 14:30   DG Chest Portable 1 View  Result Date: 06/08/2019 CLINICAL DATA:  Shortness of breath, hypoxia. EXAM: PORTABLE CHEST 1 VIEW COMPARISON:  April 29, 2018. FINDINGS: The heart size and mediastinal contours are within normal limits. No pneumothorax or pleural effusion is noted. Bilateral perihilar and upper lobe opacities are noted concerning for edema or possibly infiltrates. The visualized skeletal structures are unremarkable. IMPRESSION: Bilateral perihilar and upper lobe opacities are noted concerning for edema or possibly infiltrates. Electronically Signed   By: Marijo Conception M.D.   On: 06/08/2019 12:37        Scheduled Meds: . albuterol  2.5 mg Nebulization TID  . benztropine  2 mg Oral Daily  . bictegravir-emtricitabine-tenofovir AF  1 tablet Oral Daily  . cariprazine  4.5 mg Oral Daily  . enoxaparin (LOVENOX) injection  40 mg Subcutaneous Q24H  . gabapentin  300 mg Oral TID  . guaiFENesin  600 mg Oral BID  . naproxen  500 mg Oral BID WC  . pantoprazole  40 mg Oral Daily  . sertraline  150 mg Oral Daily  . valACYclovir  500 mg Oral Daily   Continuous Infusions: . sodium chloride 100 mL/hr at 06/09/19 1157  . piperacillin-tazobactam (ZOSYN)  IV 3.375 g (06/09/19 1110)  . sodium chloride       LOS: 0 days     Cordelia Poche, MD [facility] 06/09/2019, 12:08 PM  If 7PM-7AM, please contact night-coverage [email or web address]

## 2019-06-10 DIAGNOSIS — J9601 Acute respiratory failure with hypoxia: Secondary | ICD-10-CM

## 2019-06-10 MED ORDER — ALBUTEROL SULFATE (2.5 MG/3ML) 0.083% IN NEBU
2.5000 mg | INHALATION_SOLUTION | Freq: Two times a day (BID) | RESPIRATORY_TRACT | Status: DC
  Administered 2019-06-10 – 2019-06-11 (×3): 2.5 mg via RESPIRATORY_TRACT
  Filled 2019-06-10 (×3): qty 3

## 2019-06-10 NOTE — Progress Notes (Signed)
PROGRESS NOTE    Yolanda Jones  [id number] DOB: Feb 26, 1980 DOA: 06/08/2019 PCP: Patient, No Pcp Per   Brief Narrative: Yolanda Jones is a 39 y.o. female with polysubstance abuse, bipolar disorder, anxiety, history of HIV. Patient presented secondary to concern for seizures in the setting of substance use and found to have multifocal pneumonia.   Assessment & Plan:   Principal Problem:   Acute hypoxemic respiratory failure (HCC) Active Problems:   Human immunodeficiency virus (HIV) disease (HCC)   PTSD (post-traumatic stress disorder)   Bipolar 1 disorder, depressed, moderate (HCC)   Hypokalemia   Polysubstance abuse ([place])   Acute respiratory failure with hypoxia Secondary to multifocal pneumonia. -Wean to room air -Goal SpO2 of >92%  Multifocal pneumonia Concern for aspiration on admission secondary to seizure history and substance use. Given Ceftriaxone and Azithromycin in the ED and transitioned to Zosyn IV. Transthoracic Echocardiogram concerning for possible TV vegetation -Continue Zosyn IV  Abnormal Transthoracic Echocardiogram Possible TV vegetation. Patient has a remote history of IV drug use but nothing recent. Last use was 2005. Blood cultures obtained and are pending. Patient with multifocal pneumonia as mentioned above. -Transesophageal Echocardiogram -ID consult  Possible seizure Witnessed by family. In setting of acute intoxication with possible fentanyl but patient is unsure of specific drug. No recurrent seizure-like activity since admission.  Metabolic vs toxic encephalopathy Possible post-ictal vs toxic from drug use. CT head negative. Resolved.  Hyperglycemia Hemoglobin A1C of 5.4%  Elevated creatinine Unknown baseline. Improvement with IV fluids.  Elevated LFTs Resolved.  HIV CD4 count is 148 currently, previous of 911 in November of 2020. Viral load in 12/2018 undetectable. Discussed with ID who recommended discontinuing Bactrim DS  as decrease in CD4 count likely related to acute infection rather than uncontrolled HIV. -Continue Biktarvy -HIV viral load  HSV -Continue Valacyclovir  Movement disorder Patient frequently moves legs. Noticed to have some tongue movement. 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 -Continue Cogentin  Bipolar disorder -Continue Vraylar  Elevated troponin Likely related to possible seizure. No chest pain. EKG without concerning ACS changes. Trended down.   DVT prophylaxis: Lovenox Code Status:   Code Status: Full Code Family Communication: None at bedside. Patient request for me to not discuss her care with family/friends Disposition Plan: Discharge pending continued workup of possible TV vegetation, weaning oxygen to room air, transition to oral antibiotics, ID recommendations.   Consultants:   Infectious disease  Procedures:   TRANSTHORACIC ECHOCARDIOGRAM (06/09/2019) IMPRESSIONS    1. The aortic valve leaflets are thickened and cannot rule out tiny  mobile density of the septal leaflet of the tricuspid valve. Would have a  low threshold to consider TEE to better evaluate.  2. Left ventricular ejection fraction, by estimation, is 60 to 65%. The  left ventricle has normal function. The left ventricle has no regional  wall motion abnormalities. Left ventricular diastolic parameters were  normal.  3. Right ventricular systolic function is normal. The right ventricular  size is mildly enlarged. There is normal pulmonary artery systolic  pressure.  4. The mitral valve is normal in structure. Trivial mitral valve  regurgitation. No evidence of mitral stenosis.  5. The aortic valve is tricuspid. Aortic valve regurgitation is trivial.  No aortic stenosis is present.  6. The inferior vena cava is dilated in size with >50% respiratory  variability, suggesting right atrial pressure of 8  mmHg.  Antimicrobials:  Ceftriaxone  Azithromycin  Zosyn    Subjective: Still with a cough especially  with movement. Non-productive.   Objective: Vitals:   06/10/19 0554 06/10/19 0846 06/10/19 0849 06/10/19 0855  BP: (!) 92/52     Pulse: 83     Resp: 20     Temp: 98.4 F (36.9 C)     TempSrc: Oral     SpO2: 99% 98% 98% 96%  Weight: 71.2 kg     Height:        Intake/Output Summary (Last 24 hours) at 06/10/2019 1316 Last data filed at 06/09/2019 1853 Gross per 24 hour  Intake 2401.88 ml  Output 200 ml  Net 2201.88 ml   Filed Weights   06/08/19 2241 06/09/19 0602 06/10/19 0554  Weight: 74.7 kg 70.7 kg 71.2 kg    Examination:  General exam: Appears calm and comfortable Respiratory system: Rales on left side, diminished. Respiratory effort normal. Cardiovascular system: S1 & S2 heard, RRR. No murmurs, rubs, gallops or clicks. Gastrointestinal system: Abdomen is nondistended, soft and nontender. No organomegaly or masses felt. Normal bowel sounds heard. Central nervous system: Alert and oriented. No focal neurological deficits. Frequently rings legs. Infrequent tongue movements noticed Extremities: No edema. No calf tenderness Skin: No cyanosis. No rashes. Psychiatry: Judgement and insight appear normal. Mood & affect appropriate.     Data Reviewed: I have personally reviewed following labs and imaging studies  CBC: Recent Labs  Lab 06/08/19 1222 06/09/19 0543  WBC 12.0* 8.7  NEUTROABS 10.8*  --   HGB 13.0 11.9*  HCT 40.4 37.4  MCV 101.5* 103.3*  PLT 358 [id number]   Basic Metabolic Panel: Recent Labs  Lab 06/08/19 1222 06/08/19 1237 06/09/19 0543  NA 136  --  141  K 3.3*  --  4.3  CL 108  --  114*  CO2 21*  --  19*  GLUCOSE 271*  --  109*  BUN 20  --  22*  CREATININE 1.39*  --  1.05*  CALCIUM 8.2*  --  7.9*  MG  --  2.1 1.9   GFR: Estimated Creatinine Clearance: 62.7 mL/min (A) (by C-G formula based on SCr of 1.05 mg/dL (H)). Liver Function  Tests: Recent Labs  Lab 06/08/19 1222 06/09/19 0543  AST 79* 37  ALT 46* 33  ALKPHOS 59 40  BILITOT 0.9 1.2  PROT 6.5 5.8*  ALBUMIN 3.5 3.0*   No results for input(s): LIPASE, AMYLASE in the last 168 hours. No results for input(s): AMMONIA in the last 168 hours. Coagulation Profile: Recent Labs  Lab 06/09/19 0543  INR 1.0   Cardiac Enzymes: No results for input(s): CKTOTAL, CKMB, CKMBINDEX, TROPONINI in the last 168 hours. BNP (last 3 results) No results for input(s): PROBNP in the last 8760 hours. HbA1C: Recent Labs    06/08/19 2059  HGBA1C 5.4   CBG: Recent Labs  Lab 06/08/19 1208  GLUCAP 314*   Lipid Profile: No results for input(s): CHOL, HDL, LDLCALC, TRIG, CHOLHDL, LDLDIRECT in the last 72 hours. Thyroid Function Tests: Recent Labs    06/08/19 2248  TSH 1.283   Anemia Panel: No results for input(s): VITAMINB12, FOLATE, FERRITIN, TIBC, IRON, RETICCTPCT in the last 72 hours. Sepsis Labs: No results for input(s): PROCALCITON, LATICACIDVEN in the last 168 hours.  Recent Results (from the past 240 hour(s))  Respiratory Panel by RT PCR (Flu A&B, Covid) - Nasopharyngeal Swab     Status: None   Collection Time: 06/08/19  4:01 PM   Specimen: Nasopharyngeal Swab  Result Value Ref Range Status   SARS Coronavirus 2 by RT  PCR NEGATIVE NEGATIVE Final    Comment: (NOTE) SARS-CoV-2 target nucleic acids are NOT DETECTED. The SARS-CoV-2 RNA is generally detectable in upper respiratoy specimens during the acute phase of infection. The lowest concentration of SARS-CoV-2 viral copies this assay can detect is 131 copies/mL. A negative result does not preclude SARS-Cov-2 infection and should not be used as the sole basis for treatment or other patient management decisions. A negative result may occur with  improper specimen collection/handling, submission of specimen other than nasopharyngeal swab, presence of viral mutation(s) within the areas targeted by this assay,  and inadequate number of viral copies (<131 copies/mL). A negative result must be combined with clinical observations, patient history, and epidemiological information. The expected result is Negative. Fact Sheet for Patients:  PinkCheek.be Fact Sheet for Healthcare Providers:  GravelBags.it This test is not yet ap proved or cleared by the [place] FDA and  has been authorized for detection and/or diagnosis of SARS-CoV-2 by FDA under an Emergency Use Authorization (EUA). This EUA will remain  in effect (meaning this test can be used) for the duration of the COVID-19 declaration under Section 564(b)(1) of the Act, 21 U.S.C. section 360bbb-3(b)(1), unless the authorization is terminated or revoked sooner.    Influenza A by PCR NEGATIVE NEGATIVE Final   Influenza B by PCR NEGATIVE NEGATIVE Final    Comment: (NOTE) The Xpert Xpress SARS-CoV-2/FLU/RSV assay is intended as an aid in  the diagnosis of influenza from Nasopharyngeal swab specimens and  should not be used as a sole basis for treatment. Nasal washings and  aspirates are unacceptable for Xpert Xpress SARS-CoV-2/FLU/RSV  testing. Fact Sheet for Patients: PinkCheek.be Fact Sheet for Healthcare Providers: GravelBags.it This test is not yet approved or cleared by the [place] FDA and  has been authorized for detection and/or diagnosis of SARS-CoV-2 by  FDA under an Emergency Use Authorization (EUA). This EUA will remain  in effect (meaning this test can be used) for the duration of the  Covid-19 declaration under Section 564(b)(1) of the Act, 21  U.S.C. section 360bbb-3(b)(1), unless the authorization is  terminated or revoked. Performed at [facility], [place] [address], [place], [place] [address]          Radiology Studies: CT Head Wo Contrast  Result Date: 06/08/2019 CLINICAL  DATA:  Possible seizure, altered mental status EXAM: CT HEAD WITHOUT CONTRAST TECHNIQUE: Contiguous axial images were obtained from the base of the skull through the vertex without intravenous contrast. COMPARISON:  Correlation made with 2020 brain MRI FINDINGS: Brain: There is no acute intracranial hemorrhage, mass effect, or edema. Gray-white differentiation is preserved. There is no extra-axial fluid collection. Ventricles and sulci are within normal limits in size and configuration. Vascular: No hyperdense vessel or unexpected calcification. Skull: Calvarium is unremarkable. Sinuses/Orbits: Nonspecific small left maxillary sinus air-fluid level is partially imaged. Orbits are unremarkable. Other: Mastoid air cells are clear. IMPRESSION: No acute intracranial hemorrhage, mass effect, or evidence of acute infarction. Electronically Signed   By: Macy Mis M.D.   On: 06/08/2019 14:40   CT Angio Chest PE W and/or Wo Contrast  Result Date: 06/08/2019 CLINICAL DATA:  Seizure, hypoxia EXAM: CT ANGIOGRAPHY CHEST WITH CONTRAST TECHNIQUE: Multidetector CT imaging of the chest was performed using the standard protocol during bolus administration of intravenous contrast. Multiplanar CT image reconstructions and MIPs were obtained to evaluate the vascular anatomy. CONTRAST:  118mL OMNIPAQUE IOHEXOL 350 MG/ML SOLN COMPARISON:  04/29/2018 FINDINGS: Cardiovascular: Satisfactory opacification of the pulmonary  arteries to the proximal segmental level. No evidence of pulmonary embolism. Normal heart size. No pericardial effusion. Mediastinum/Nodes: No enlarged lymph nodes identified. Visualized thyroid is unremarkable, noting presence of artifact. Lungs/Pleura: Dense consolidation in the lower lobes bilaterally with additional involvement of bilateral upper lobes. No pleural effusion. No pneumothorax. Upper Abdomen: Cholecystectomy clips.  No acute abnormality. Musculoskeletal: No acute osseous abnormality. Apparent  fracture of the anterolateral right fourth rib is favored to reflect motion artifact. Review of the MIP images confirms the above findings. IMPRESSION: No evidence of acute pulmonary embolism. Multifocal pneumonia. Electronically Signed   By: Macy Mis M.D.   On: 06/08/2019 14:36   CT Cervical Spine Wo Contrast  Result Date: 06/08/2019 CLINICAL DATA:  Fall, possible seizure EXAM: CT CERVICAL SPINE WITHOUT CONTRAST TECHNIQUE: Multidetector CT imaging of the cervical spine was performed without intravenous contrast. Multiplanar CT image reconstructions were also generated. COMPARISON:  None. FINDINGS: Alignment: Anteroposterior alignment is maintained. Skull base and vertebrae: There is no acute cervical spine fracture. Soft tissues and spinal canal: No prevertebral fluid or swelling. No visible canal hematoma. Disc levels: Minor degenerative changes are present, greatest at C4-C5 and C5-C6. Upper chest: Refer to concurrent dedicated chest imaging. Other: None. IMPRESSION: No acute cervical spine fracture. Electronically Signed   By: Macy Mis M.D.   On: 06/08/2019 14:42   CT Lumbar Spine Wo Contrast  Result Date: 06/08/2019 CLINICAL DATA:  Seizure, fall, low back pain EXAM: CT LUMBAR SPINE WITHOUT CONTRAST TECHNIQUE: Multidetector CT imaging of the lumbar spine was performed without intravenous contrast administration. Multiplanar CT image reconstructions were also generated. COMPARISON:  None. FINDINGS: Segmentation: 5 lumbar type vertebrae. Alignment: Anteroposterior alignment is maintained. Vertebrae: There is no acute fracture. No destructive osseous lesion. Paraspinal and other soft tissues: Negative. Disc levels: Probable disc bulge with small right foraminal protrusion, endplate osteophytic ridging, and facet hypertrophy at L4-L5 resulting in left foraminal stenosis. There is vacuum disc phenomenon at L5-S1 with disc bulge, endplate osteophytic ridging, and facet hypertrophy resulting in  right greater than left foraminal stenosis. No apparent canal stenosis at any level. IMPRESSION: No acute lumbar spine fracture. Mild lower lumbar degenerative changes. Electronically Signed   By: Macy Mis M.D.   On: 06/08/2019 14:30   ECHOCARDIOGRAM COMPLETE  Result Date: 06/09/2019    ECHOCARDIOGRAM REPORT   Patient Name:   Yolanda Jones Date of Exam: 06/09/2019 Medical Rec #:  [id number]         Height:       60.0 in Accession #:    [id number]        Weight:       155.9 lb Date of Birth:  May 19, 1980         BSA:          1.679 m Patient Age:    3 years          BP:           86/59 mmHg Patient Gender: F                 HR:           84 bpm. Exam Location:  Inpatient Procedure: 2D Echo Indications:    chest pain 786.50  History:        Patient has no prior history of Echocardiogram examinations.                 Substance abuse. HIV.  Sonographer:    Jannett Celestine RDCS (  AE) Referring Phys: [id number] [place]  1. The aortic valve leaflets are thickened and cannot rule out tiny mobile density of the septal leaflet of the tricuspid valve. Would have a low threshold to consider TEE to better evaluate.  2. Left ventricular ejection fraction, by estimation, is 60 to 65%. The left ventricle has normal function. The left ventricle has no regional wall motion abnormalities. Left ventricular diastolic parameters were normal.  3. Right ventricular systolic function is normal. The right ventricular size is mildly enlarged. There is normal pulmonary artery systolic pressure.  4. The mitral valve is normal in structure. Trivial mitral valve regurgitation. No evidence of mitral stenosis.  5. The aortic valve is tricuspid. Aortic valve regurgitation is trivial. No aortic stenosis is present.  6. The inferior vena cava is dilated in size with >50% respiratory variability, suggesting right atrial pressure of 8 mmHg. FINDINGS  Left Ventricle: Left ventricular ejection fraction, by estimation, is 60 to  65%. The left ventricle has normal function. The left ventricle has no regional wall motion abnormalities. The left ventricular internal cavity size was normal in size. There is  no left ventricular hypertrophy. Left ventricular diastolic parameters were normal. Indeterminate filling pressures. Right Ventricle: The right ventricular size is mildly enlarged. No increase in right ventricular wall thickness. Right ventricular systolic function is normal. There is normal pulmonary artery systolic pressure. The tricuspid regurgitant velocity is 2.58  m/s, and with an assumed right atrial pressure of 8 mmHg, the estimated right ventricular systolic pressure is [id number] mmHg. Left Atrium: Left atrial size was normal in size. Right Atrium: Right atrial size was normal in size. Pericardium: There is no evidence of pericardial effusion. Mitral Valve: The mitral valve is normal in structure. Normal mobility of the mitral valve leaflets. Trivial mitral valve regurgitation. No evidence of mitral valve stenosis. Tricuspid Valve: The tricuspid valve is normal in structure. Tricuspid valve regurgitation is trivial. No evidence of tricuspid stenosis. Aortic Valve: The aortic valve is tricuspid. . There is mild thickening of the aortic valve. Aortic valve regurgitation is trivial. No aortic stenosis is present. There is mild thickening of the aortic valve. Pulmonic Valve: The pulmonic valve was normal in structure. Pulmonic valve regurgitation is not visualized. No evidence of pulmonic stenosis. Aorta: The aortic root is normal in size and structure. Venous: The inferior vena cava is dilated in size with greater than 50% respiratory variability, suggesting right atrial pressure of 8 mmHg. IAS/Shunts: No atrial level shunt detected by color flow Doppler.  LEFT VENTRICLE PLAX 2D LVIDd:         4.40 cm  Diastology LVIDs:         2.90 cm  LV e' lateral:   10.90 cm/s LV PW:         1.30 cm  LV E/e' lateral: 7.8 LV IVS:        0.70 cm  LV e'  medial:    7.62 cm/s LVOT diam:     1.70 cm  LV E/e' medial:  11.2 LV SV:         51 LV SV Index:   30 LVOT Area:     2.27 cm  RIGHT VENTRICLE RV S prime:     8.59 cm/s TAPSE (M-mode): 1.6 cm LEFT ATRIUM             Index       RIGHT ATRIUM           Index LA diam:  3.20 cm 1.91 cm/m  RA Area:     14.40 cm LA Vol (A2C):   22.4 ml 13.34 ml/m RA Volume:   34.10 ml  20.31 ml/m LA Vol (A4C):   26.1 ml 15.55 ml/m LA Biplane Vol: 26.6 ml 15.84 ml/m  AORTIC VALVE LVOT Vmax:   115.00 cm/s LVOT Vmean:  78.100 cm/s LVOT VTI:    0.225 m  AORTA Ao Root diam: 2.80 cm MITRAL VALVE               TRICUSPID VALVE MV Area (PHT): 3.39 cm    TR Peak grad:   26.6 mmHg MV Decel Time: 224 msec    TR Vmax:        258.00 cm/s MV E velocity: 85.30 cm/s MV A velocity: 65.60 cm/s  SHUNTS MV E/A ratio:  1.30        Systemic VTI:  0.22 m                            Systemic Diam: 1.70 cm Skeet Latch MD Electronically signed by Skeet Latch MD Signature Date/Time: 06/09/2019/3:58:03 PM    Final         Scheduled Meds:  albuterol  2.5 mg Nebulization BID   benztropine  2 mg Oral Daily   bictegravir-emtricitabine-tenofovir AF  1 tablet Oral Daily   cariprazine  4.5 mg Oral Daily   enoxaparin (LOVENOX) injection  40 mg Subcutaneous Q24H   gabapentin  300 mg Oral TID   guaiFENesin  600 mg Oral BID   naproxen  500 mg Oral BID WC   pantoprazole  40 mg Oral Daily   sertraline  150 mg Oral Daily   valACYclovir  500 mg Oral Daily   Continuous Infusions:  sodium chloride 100 mL/hr at 06/10/19 0539   piperacillin-tazobactam (ZOSYN)  IV 3.375 g (06/10/19 0010)     LOS: 1 day     Cordelia Poche, MD [facility] 06/10/2019, 1:16 PM  If 7PM-7AM, please contact night-coverage [email or web address]

## 2019-06-10 NOTE — Plan of Care (Signed)

## 2019-06-10 NOTE — Progress Notes (Signed)
   06/08/19 2241  Assess: MEWS Score  Temp (!) 97.5 F (36.4 C)  BP (!) 88/59  Pulse Rate 84  Resp (!) 22  SpO2 96 %  O2 Device Nasal Cannula  Assess: MEWS Score  MEWS Temp 0  MEWS Systolic 1  MEWS Pulse 0  MEWS RR 1  MEWS LOC 0  MEWS Score 2  MEWS Score Color Yellow  Assess: if the MEWS score is Yellow or Red  Were vital signs taken at a resting state? Yes  Focused Assessment Documented focused assessment  Early Detection of Sepsis Score *See Row Information* Low  MEWS guidelines implemented *See Row Information* Yes  Treat  MEWS Interventions Administered scheduled meds/treatments  Take Vital Signs  Increase Vital Sign Frequency  Yellow: Q 2hr X 2 then Q 4hr X 2, if remains yellow, continue Q 4hrs  Escalate  MEWS: Escalate Yellow: discuss with charge nurse/RN and consider discussing with provider and RRT  Notify: Charge Nurse/RN  Name of Charge Nurse/RN Notified Tom  Date Charge Nurse/RN Notified 06/08/19  Document  Patient Outcome Stabilized after interventions (continue monitor )
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
## 2019-06-10 NOTE — Progress Notes (Signed)
    [facility] has been requested to perform a transesophageal echocardiogram on [name] for endocarditis.  After careful review of history and examination, the risks and benefits of transesophageal echocardiogram have been explained including risks of esophageal damage, perforation (1:10,000 risk), bleeding, pharyngeal hematoma as well as other potential complications associated with conscious sedation including aspiration, arrhythmia, respiratory failure and death. Alternatives to treatment were discussed, questions were answered. Patient is willing to proceed.   Planned for Monday 06/13/19. NPO at midnight Sunday night.  Tami Lin [place], [place]  06/10/2019 11:04 AM

## 2019-06-10 NOTE — Progress Notes (Signed)
Pharmacy Antibiotic Note  Yolanda Jones is a 39 y.o. female admitted on 06/08/2019 after a seizure. Pt has PMH significant for bipolar disorder, polysubstance abuse. Pharmacy consulted to dose piperacillin/tazobactam for concern for aspiration PNA.   Today, 06/10/19 -WBC WNL -SCr 1.05, CrCl 62 mL/min -Afebrile  Today is day #3 of IV antibiotics, day #2 of pip/tazo.  Plan:  Continue piperacillin/tazobactam 3.375 g IV q8h EI  Pharmacy to sign off, renal function improved. Please re-consult if needed.    Height: 5' (152.4 cm) Weight: 71.2 kg (156 lb 15.5 oz) IBW/kg (Calculated) : 45.5  Temp (24hrs), Avg:98.4 F (36.9 C), Min:98.1 F (36.7 C), Max:98.5 F (36.9 C)  Recent Labs  Lab 06/08/19 1222 06/09/19 0543  WBC 12.0* 8.7  CREATININE 1.39* 1.05*    Estimated Creatinine Clearance: 62.7 mL/min (A) (by C-G formula based on SCr of 1.05 mg/dL (H)).    Allergies  Allergen Reactions  . Gabapentin     Pt denies, but doesn't like the way she feels- drunk, sluggish  . Naproxen   . Baclofen     Mumbled speech, AMD  . Clindamycin/Lincomycin Other (See Comments)    vomiting  . Meloxicam Other (See Comments)    Swelling in face   . Oxaprozin Other (See Comments)    Swelling in face   . Prednisone Other (See Comments)    homicidal  . Strattera [Atomoxetine Hcl] Swelling  . Tramadol Other (See Comments)    Seizure     Antimicrobials this admission: 4/28 azith x 1 4/28 CTX x1 4/29 piperacillin/tazobactam >>  Thank you for allowing pharmacy to be a part of this patient's care.  Lenis Noon, PharmD 06/10/19 8:07 AM

## 2019-06-10 NOTE — Progress Notes (Signed)
Pt scheduled for 1230 TEE on Monday 5/3.  [facility] to transport with arrival time 1130 to [facility].  Bedside nurse notified.  Vista Lawman, RN

## 2019-06-11 LAB — CBC
HCT: 34 % — ABNORMAL LOW (ref 36.0–46.0)
Hemoglobin: 10.9 g/dL — ABNORMAL LOW (ref 12.0–15.0)
MCH: 33 pg (ref 26.0–34.0)
MCHC: 32.1 g/dL (ref 30.0–36.0)
MCV: 103 fL — ABNORMAL HIGH (ref 80.0–100.0)
Platelets: 253 10*3/uL (ref 150–400)
RBC: 3.3 MIL/uL — ABNORMAL LOW (ref 3.87–5.11)
RDW: 13.2 % (ref 11.5–15.5)
WBC: 6.8 10*3/uL (ref 4.0–10.5)
nRBC: 0 % (ref 0.0–0.2)

## 2019-06-11 LAB — HIV-1 RNA QUANT-NO REFLEX-BLD
HIV 1 RNA Quant: <20 copies/mL
LOG10 HIV-1 RNA: UNDETERMINED log10copy/mL

## 2019-06-11 LAB — BASIC METABOLIC PANEL
Anion gap: 5 (ref 5–15)
BUN: 10 mg/dL (ref 6–20)
CO2: 21 mmol/L — ABNORMAL LOW (ref 22–32)
Calcium: 8.5 mg/dL — ABNORMAL LOW (ref 8.9–10.3)
Chloride: 114 mmol/L — ABNORMAL HIGH (ref 98–111)
Creatinine, Ser: 0.96 mg/dL (ref 0.44–1.00)
GFR calc Af Amer: >60 mL/min (ref >60)
GFR calc non Af Amer: >60 mL/min (ref >60)
Glucose, Bld: 86 mg/dL (ref 70–99)
Potassium: 3.8 mmol/L (ref 3.5–5.1)
Sodium: 140 mmol/L (ref 135–145)

## 2019-06-11 MED ORDER — SODIUM CHLORIDE 0.9 % IV SOLN
3.0000 g | Freq: Four times a day (QID) | INTRAVENOUS | Status: DC
  Administered 2019-06-11 – 2019-06-13 (×9): 3 g via INTRAVENOUS
  Filled 2019-06-11: qty 8
  Filled 2019-06-11 (×4): qty 3
  Filled 2019-06-11: qty 8
  Filled 2019-06-11: qty 3
  Filled 2019-06-11 (×2): qty 8
  Filled 2019-06-11 (×2): qty 3

## 2019-06-11 MED ORDER — BENZONATATE 100 MG PO CAPS
200.0000 mg | ORAL_CAPSULE | Freq: Two times a day (BID) | ORAL | Status: DC | PRN
  Administered 2019-06-11: 200 mg via ORAL
  Filled 2019-06-11 (×2): qty 2

## 2019-06-11 MED ORDER — VANCOMYCIN HCL IN DEXTROSE 1-5 GM/200ML-% IV SOLN
1000.0000 mg | INTRAVENOUS | Status: DC
  Administered 2019-06-12: 1000 mg via INTRAVENOUS
  Filled 2019-06-11: qty 200

## 2019-06-11 MED ORDER — VANCOMYCIN HCL 1500 MG/300ML IV SOLN
1500.0000 mg | Freq: Once | INTRAVENOUS | Status: AC
  Administered 2019-06-11: 1500 mg via INTRAVENOUS
  Filled 2019-06-11: qty 300

## 2019-06-11 NOTE — Progress Notes (Signed)
Pharmacy Antibiotic Note  Yolanda Jones is a 39 y.o. female with hx HIV, seizures and substance abuse, presented to the ED on 06/08/2019 with seizures.  Chest CTA on 4/28 showed multifocal PNA.  She was started on zosyn on admission for PNA.  TTE showed findings with suspicion for TV vegetation.  ID recom. to change abx to vancomycin and unasyn for presumed endocarditis.  Today, 06/11/2019: - Tmax 99, wbc wnl - scr down 0.96 - plan for TEE on 5/3  Plan: - unasyn 3gm IV q6h - vancomycin 1500mg  IV x1, then 1000 mg IV q24h for est AUC 546 - f/u cx, TEE results _________________________________  Height: 5' (152.4 cm) Weight: 72.4 kg (159 lb 9.8 oz) IBW/kg (Calculated) : 45.5  Temp (24hrs), Avg:98.5 F (36.9 C), Min:98.1 F (36.7 C), Max:99 F (37.2 C)  Recent Labs  Lab 06/08/19 1222 06/09/19 0543 06/11/19 0719  WBC 12.0* 8.7 6.8  CREATININE 1.39* 1.05* 0.96    Estimated Creatinine Clearance: 69.2 mL/min (by C-G formula based on SCr of 0.96 mg/dL).    Allergies  Allergen Reactions  . Gabapentin     Pt denies, but doesn't like the way she feels- drunk, sluggish  . Naproxen   . Baclofen     Mumbled speech, AMD  . Clindamycin/Lincomycin Other (See Comments)    vomiting  . Meloxicam Other (See Comments)    Swelling in face   . Oxaprozin Other (See Comments)    Swelling in face   . Prednisone Other (See Comments)    homicidal  . Strattera [Atomoxetine Hcl] Swelling  . Tramadol Other (See Comments)    Seizure      Thank you for allowing pharmacy to be a part of this patient's care.  Lynelle Doctor 06/11/2019 11:28 AM

## 2019-06-11 NOTE — Progress Notes (Signed)
Pt ambulated in hall and maintained an SPO2: 98% on Room Air. Pt tolerated well with no complaints of shortness of breath. Pt is currently resting in room with no oxygen. Will continue to monitor.

## 2019-06-11 NOTE — Progress Notes (Signed)
PROGRESS NOTE    Yolanda Jones  [id number] DOB: [birth year] DOA: 06/08/2019 PCP: Patient, No Pcp Per   Brief Narrative: Yolanda Jones is a 39 y.o. female with polysubstance abuse, bipolar disorder, anxiety, history of HIV. Patient presented secondary to concern for seizures in the setting of substance use and found to have multifocal pneumonia.   Assessment & Plan:   Principal Problem:   Acute hypoxemic respiratory failure (HCC) Active Problems:   Human immunodeficiency virus (HIV) disease (HCC)   PTSD (post-traumatic stress disorder)   Bipolar 1 disorder, depressed, moderate (HCC)   Hypokalemia   Polysubstance abuse ([place])   Acute respiratory failure with hypoxia Secondary to multifocal pneumonia. Weaning down nicely. Down to 1 L O2 via [place] today. -Wean to room air -Goal SpO2 of >92%  Multifocal pneumonia Concern for aspiration on admission secondary to seizure history and substance use. Given Ceftriaxone and Azithromycin in the ED and transitioned to Zosyn IV. Transthoracic Echocardiogram concerning for possible TV vegetation -Continue Zosyn IV; will need to clarify antibiotic regimen with ID  Abnormal Transthoracic Echocardiogram Possible TV vegetation. Patient has a remote history of IV drug use but nothing recent. Last use was 2005. Blood cultures obtained and are pending. Patient with multifocal pneumonia as mentioned above. -Transesophageal Echocardiogram pending -ID Recommendations  Possible seizure Witnessed by family. In setting of acute intoxication with possible fentanyl but patient is unsure of specific drug. No recurrent seizure-like activity since admission.  Metabolic vs toxic encephalopathy Possible post-ictal vs toxic from drug use. CT head negative. Resolved.  Hyperglycemia Hemoglobin A1C of 5.4%  Elevated creatinine Unknown baseline. Improvement with IV fluids.  Elevated LFTs Resolved.  HIV CD4 count is 148 currently, previous of 911  in November of 2020. Viral load in 12/2018 undetectable. Discussed with ID who recommended discontinuing Bactrim DS as decrease in CD4 count likely related to acute infection rather than uncontrolled HIV. -Continue Biktarvy -HIV viral load pending  HSV -Continue Valacyclovir  Movement disorder Patient frequently moves legs. Noticed to have some tongue movement. 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 -Continue Cogentin  Bipolar disorder -Continue Vraylar  Elevated troponin Likely related to possible seizure. No chest pain. EKG without concerning ACS changes. Trended down.   DVT prophylaxis: Lovenox Code Status:   Code Status: Full Code Family Communication: None at bedside. Patient request for me to not discuss her care with family/friends Disposition Plan: Discharge pending continued workup of possible TV vegetation, weaning oxygen to room air, transition to oral antibiotics, ID recommendations.   Consultants:   Infectious disease  Procedures:   TRANSTHORACIC ECHOCARDIOGRAM (06/09/2019) IMPRESSIONS    1. The aortic valve leaflets are thickened and cannot rule out tiny  mobile density of the septal leaflet of the tricuspid valve. Would have a  low threshold to consider TEE to better evaluate.  2. Left ventricular ejection fraction, by estimation, is 60 to 65%. The  left ventricle has normal function. The left ventricle has no regional  wall motion abnormalities. Left ventricular diastolic parameters were  normal.  3. Right ventricular systolic function is normal. The right ventricular  size is mildly enlarged. There is normal pulmonary artery systolic  pressure.  4. The mitral valve is normal in structure. Trivial mitral valve  regurgitation. No evidence of mitral stenosis.  5. The aortic valve is tricuspid. Aortic valve regurgitation is trivial.  No aortic stenosis is present.  6. The inferior vena cava is dilated in size with >50%  respiratory  variability, suggesting right atrial  pressure of 8 mmHg.  Antimicrobials:  Ceftriaxone  Azithromycin  Zosyn    Subjective: Feeling better overall. Still with a non-productive cough.  Objective: Vitals:   06/10/19 2046 06/10/19 2050 06/11/19 0538 06/11/19 0836  BP: 96/62  101/72   Pulse: 75  67   Resp: 20  20   Temp: 99 F (37.2 C)  98.1 F (36.7 C)   TempSrc: Oral  Oral   SpO2: 99% 98% 99% 98%  Weight:   72.4 kg   Height:        Intake/Output Summary (Last 24 hours) at 06/11/2019 1114 Last data filed at 06/10/2019 1647 Gross per 24 hour  Intake 1534 ml  Output --  Net 1534 ml   Filed Weights   06/09/19 0602 06/10/19 0554 06/11/19 0538  Weight: 70.7 kg 71.2 kg 72.4 kg    Examination:  General exam: Appears calm and comfortable Respiratory system: Rales on left side, otherwise clear. Respiratory effort normal. Cardiovascular system: S1 & S2 heard, RRR. No murmurs, rubs, gallops or clicks. Gastrointestinal system: Abdomen is nondistended, soft and nontender. No organomegaly or masses felt. Normal bowel sounds heard. Central nervous system: Alert and oriented. Extremities: No edema. No calf tenderness Skin: No cyanosis. No rashes Psychiatry: Judgement and insight appear normal.   Data Reviewed: I have personally reviewed following labs and imaging studies  CBC: Recent Labs  Lab 06/08/19 1222 06/09/19 0543 06/11/19 0719  WBC 12.0* 8.7 6.8  NEUTROABS 10.8*  --   --   HGB 13.0 11.9* 10.9*  HCT 40.4 37.4 34.0*  MCV 101.5* 103.3* 103.0*  PLT 358 247 [id number]   Basic Metabolic Panel: Recent Labs  Lab 06/08/19 1222 06/08/19 1237 06/09/19 0543 06/11/19 0719  NA 136  --  141 140  K 3.3*  --  4.3 3.8  CL 108  --  114* 114*  CO2 21*  --  19* 21*  GLUCOSE 271*  --  109* 86  BUN 20  --  22* 10  CREATININE 1.39*  --  1.05* 0.96  CALCIUM 8.2*  --  7.9* 8.5*  MG  --  2.1 1.9  --    GFR: Estimated Creatinine Clearance: 69.2 mL/min (by C-G formula  based on SCr of 0.96 mg/dL). Liver Function Tests: Recent Labs  Lab 06/08/19 1222 06/09/19 0543  AST 79* 37  ALT 46* 33  ALKPHOS 59 40  BILITOT 0.9 1.2  PROT 6.5 5.8*  ALBUMIN 3.5 3.0*   No results for input(s): LIPASE, AMYLASE in the last 168 hours. No results for input(s): AMMONIA in the last 168 hours. Coagulation Profile: Recent Labs  Lab 06/09/19 0543  INR 1.0   Cardiac Enzymes: No results for input(s): CKTOTAL, CKMB, CKMBINDEX, TROPONINI in the last 168 hours. BNP (last 3 results) No results for input(s): PROBNP in the last 8760 hours. HbA1C: Recent Labs    06/08/19 2059  HGBA1C 5.4   CBG: Recent Labs  Lab 06/08/19 1208  GLUCAP 314*   Lipid Profile: No results for input(s): CHOL, HDL, LDLCALC, TRIG, CHOLHDL, LDLDIRECT in the last 72 hours. Thyroid Function Tests: Recent Labs    06/08/19 2248  TSH 1.283   Anemia Panel: No results for input(s): VITAMINB12, FOLATE, FERRITIN, TIBC, IRON, RETICCTPCT in the last 72 hours. Sepsis Labs: No results for input(s): PROCALCITON, LATICACIDVEN in the last 168 hours.  Recent Results (from the past 240 hour(s))  Respiratory Panel by RT PCR (Flu A&B, Covid) - Nasopharyngeal Swab     Status: None  Collection Time: 06/08/19  4:01 PM   Specimen: Nasopharyngeal Swab  Result Value Ref Range Status   SARS Coronavirus 2 by RT PCR NEGATIVE NEGATIVE Final    Comment: (NOTE) SARS-CoV-2 target nucleic acids are NOT DETECTED. The SARS-CoV-2 RNA is generally detectable in upper respiratoy specimens during the acute phase of infection. The lowest concentration of SARS-CoV-2 viral copies this assay can detect is 131 copies/mL. A negative result does not preclude SARS-Cov-2 infection and should not be used as the sole basis for treatment or other patient management decisions. A negative result may occur with  improper specimen collection/handling, submission of specimen other than nasopharyngeal swab, presence of viral  mutation(s) within the areas targeted by this assay, and inadequate number of viral copies (<131 copies/mL). A negative result must be combined with clinical observations, patient history, and epidemiological information. The expected result is Negative. Fact Sheet for Patients:  PinkCheek.be Fact Sheet for Healthcare Providers:  GravelBags.it This test is not yet ap proved or cleared by the [place] FDA and  has been authorized for detection and/or diagnosis of SARS-CoV-2 by FDA under an Emergency Use Authorization (EUA). This EUA will remain  in effect (meaning this test can be used) for the duration of the COVID-19 declaration under Section 564(b)(1) of the Act, 21 U.S.C. section 360bbb-3(b)(1), unless the authorization is terminated or revoked sooner.    Influenza A by PCR NEGATIVE NEGATIVE Final   Influenza B by PCR NEGATIVE NEGATIVE Final    Comment: (NOTE) The Xpert Xpress SARS-CoV-2/FLU/RSV assay is intended as an aid in  the diagnosis of influenza from Nasopharyngeal swab specimens and  should not be used as a sole basis for treatment. Nasal washings and  aspirates are unacceptable for Xpert Xpress SARS-CoV-2/FLU/RSV  testing. Fact Sheet for Patients: PinkCheek.be Fact Sheet for Healthcare Providers: GravelBags.it This test is not yet approved or cleared by the [place] FDA and  has been authorized for detection and/or diagnosis of SARS-CoV-2 by  FDA under an Emergency Use Authorization (EUA). This EUA will remain  in effect (meaning this test can be used) for the duration of the  Covid-19 declaration under Section 564(b)(1) of the Act, 21  U.S.C. section 360bbb-3(b)(1), unless the authorization is  terminated or revoked. Performed at [facility], [place] [address], [place], [place] [address]   Culture, blood (routine x 2)      Status: None (Preliminary result)   Collection Time: 06/09/19  4:53 PM   Specimen: BLOOD LEFT HAND  Result Value Ref Range Status   Specimen Description   Final    BLOOD LEFT HAND Performed at [facility] [address], [place], [place] [address]    Special Requests   Final    BOTTLES DRAWN AEROBIC ONLY Blood Culture results may not be optimal due to an inadequate volume of blood received in culture bottles Performed at [facility] [address], [place], [place] [address]    Culture   Final    NO GROWTH 2 DAYS Performed at [facility] [address] [place], [place] [address]    Report Status PENDING  Incomplete  Culture, blood (routine x 2)     Status: None (Preliminary result)   Collection Time: 06/09/19  4:53 PM   Specimen: BLOOD  Result Value Ref Range Status   Specimen Description   Final    BLOOD RIGHT ANTECUBITAL Performed at [facility] [address] [place], [place]  [address]    Special Requests   Final    BOTTLES DRAWN AEROBIC AND ANAEROBIC Blood Culture adequate volume Performed at [facility] [address] [place], [place] [address]    Culture   Final    NO GROWTH 2 DAYS Performed at [facility] [address] [place], [place] [address]    Report Status PENDING  Incomplete         Radiology Studies: No results found.      Scheduled Meds: . albuterol  2.5 mg Nebulization BID  . benztropine  2 mg Oral Daily  . bictegravir-emtricitabine-tenofovir AF  1 tablet Oral Daily  . cariprazine  4.5 mg Oral Daily  . enoxaparin (LOVENOX) injection  40 mg Subcutaneous Q24H  . gabapentin  300 mg Oral TID  . guaiFENesin  600 mg Oral BID  . naproxen  500 mg Oral BID WC  . pantoprazole  40 mg Oral Daily  . sertraline  150 mg Oral Daily  . valACYclovir  500 mg Oral Daily   Continuous Infusions: . sodium chloride 100 mL/hr at 06/11/19 0042  .  piperacillin-tazobactam (ZOSYN)  IV 3.375 g (06/11/19 0535)     LOS: 2 days     Cordelia Poche, MD [facility] 06/11/2019, 11:14 AM  If 7PM-7AM, please contact night-coverage [email or web address]

## 2019-06-11 NOTE — Plan of Care (Signed)
  Problem: Health Behavior/Discharge Planning: Goal: Ability to manage health-related needs will improve Outcome: Progressing   Problem: Clinical Measurements: Goal: Ability to maintain clinical measurements within normal limits will improve Outcome: Progressing   Problem: Education: Goal: Knowledge of General Education information will improve Description: Including pain rating scale, medication(s)/side effects and non-pharmacologic comfort measures Outcome: Progressing   Problem: Clinical Measurements: Goal: Respiratory complications will improve Outcome: Progressing

## 2019-06-12 ENCOUNTER — Other Ambulatory Visit: Payer: Self-pay

## 2019-06-12 LAB — CBC
HCT: 33.1 % — ABNORMAL LOW (ref 36.0–46.0)
Hemoglobin: 10.9 g/dL — ABNORMAL LOW (ref 12.0–15.0)
MCH: 33.1 pg (ref 26.0–34.0)
MCHC: 32.9 g/dL (ref 30.0–36.0)
MCV: 100.6 fL — ABNORMAL HIGH (ref 80.0–100.0)
Platelets: 279 10*3/uL (ref 150–400)
RBC: 3.29 MIL/uL — ABNORMAL LOW (ref 3.87–5.11)
RDW: 12.8 % (ref 11.5–15.5)
WBC: 5.6 10*3/uL (ref 4.0–10.5)
nRBC: 0 % (ref 0.0–0.2)

## 2019-06-12 MED ORDER — SODIUM CHLORIDE 0.9 % IV SOLN: INTRAVENOUS | Status: DC

## 2019-06-12 NOTE — H&P (View-Only) (Signed)
PROGRESS NOTE    Yolanda Jones  [id number] DOB: 09-Oct-1980 DOA: 06/08/2019 PCP: Patient, No Pcp Per   Brief Narrative: Yolanda Jones is a 39 y.o. female with polysubstance abuse, bipolar disorder, anxiety, history of HIV. Patient presented secondary to concern for seizures in the setting of substance use and found to have multifocal pneumonia.   Assessment & Plan:   Principal Problem:   Acute hypoxemic respiratory failure (HCC) Active Problems:   Human immunodeficiency virus (HIV) disease (HCC)   PTSD (post-traumatic stress disorder)   Bipolar 1 disorder, depressed, moderate (HCC)   Hypokalemia   Polysubstance abuse ([place])   Acute respiratory failure with hypoxia Secondary to multifocal pneumonia. Weaned to room air.  Multifocal pneumonia Concern for aspiration on admission secondary to seizure history and substance use. Given Ceftriaxone and Azithromycin in the ED and transitioned to Zosyn IV. Transthoracic Echocardiogram concerning for possible TV vegetation -ID: Vancomycin and Unasyn to cover for possible endocarditis   Abnormal Transthoracic Echocardiogram Possible TV vegetation. Patient has a remote history of IV drug use but nothing recent. Last use was 2005. Blood cultures obtained and are pending. Patient with multifocal pneumonia as mentioned above. -Transesophageal Echocardiogram pending -ID Recommendations  Possible seizure Witnessed by family. In setting of acute intoxication with possible fentanyl but patient is unsure of specific drug. No recurrent seizure-like activity since admission.  Metabolic vs toxic encephalopathy Possible post-ictal vs toxic from drug use. CT head negative. Resolved.  Hyperglycemia Hemoglobin A1C of 5.4%  Elevated creatinine Unknown baseline. Improvement with IV fluids.  Elevated LFTs Resolved.  HIV CD4 count is 148 currently, previous of 911 in November of 2020. Viral load in 12/2018 undetectable. Discussed with ID  who recommended discontinuing Bactrim DS as decrease in CD4 count likely related to acute infection rather than uncontrolled HIV. Viral load (4/30) is undetectable. -Continue Biktarvy  HSV -Continue Valacyclovir  Movement disorder Patient frequently moves legs. Noticed to have some tongue movement. 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 -Continue Cogentin  Bipolar disorder -Continue Vraylar  Elevated troponin Likely related to possible seizure. No chest pain. EKG without concerning ACS changes. Trended down.   DVT prophylaxis: Lovenox Code Status:   Code Status: Full Code Family Communication: None at bedside. Patient request for me to not discuss her care with family/friends Disposition Plan: Discharge pending continued workup of possible TV vegetation, weaning oxygen to room air, transition to oral antibiotics, ID recommendations.   Consultants:   Infectious disease  Procedures:   TRANSTHORACIC ECHOCARDIOGRAM (06/09/2019) IMPRESSIONS    1. The aortic valve leaflets are thickened and cannot rule out tiny  mobile density of the septal leaflet of the tricuspid valve. Would have a  low threshold to consider TEE to better evaluate.  2. Left ventricular ejection fraction, by estimation, is 60 to 65%. The  left ventricle has normal function. The left ventricle has no regional  wall motion abnormalities. Left ventricular diastolic parameters were  normal.  3. Right ventricular systolic function is normal. The right ventricular  size is mildly enlarged. There is normal pulmonary artery systolic  pressure.  4. The mitral valve is normal in structure. Trivial mitral valve  regurgitation. No evidence of mitral stenosis.  5. The aortic valve is tricuspid. Aortic valve regurgitation is trivial.  No aortic stenosis is present.  6. The inferior vena cava is dilated in size with >50% respiratory  variability, suggesting right atrial pressure of 8  mmHg.  Antimicrobials:  Ceftriaxone  Azithromycin  Zosyn  Subjective: Cough is improved. Feeling anxious. No other concerns.  Objective: Vitals:   06/11/19 1119 06/11/19 1300 06/11/19 2041 06/12/19 0700  BP: 103/72 113/76 123/85 (!) 102/59  Pulse: 91 82 65 63  Resp: 20 18 (!) 21 20  Temp: 98.7 F (37.1 C) 98 F (36.7 C) 99.4 F (37.4 C) 98.6 F (37 C)  TempSrc: Oral Oral Oral Oral  SpO2: 100% 99% 98% 98%  Weight:      Height:        Intake/Output Summary (Last 24 hours) at 06/12/2019 1101 Last data filed at 06/12/2019 0200 Gross per 24 hour  Intake 1219.27 ml  Output --  Net 1219.27 ml   Filed Weights   06/09/19 0602 06/10/19 0554 06/11/19 0538  Weight: 70.7 kg 71.2 kg 72.4 kg    Examination:  General exam: Appears calm and comfortable Respiratory system: Clear to auscultation. Respiratory effort normal. Cardiovascular system: S1 & S2 heard, RRR. No murmurs, rubs, gallops or clicks. Gastrointestinal system: Abdomen is nondistended, soft and nontender. No organomegaly or masses felt. Normal bowel sounds heard. Central nervous system: Alert and oriented. No focal neurological deficits. Extremities: No edema. No calf tenderness Skin: No cyanosis. No rashes Psychiatry: Judgement and insight appear normal. Mood & affect appropriate.    Data Reviewed: I have personally reviewed following labs and imaging studies  CBC: Recent Labs  Lab 06/08/19 1222 06/09/19 0543 06/11/19 0719 06/12/19 0527  WBC 12.0* 8.7 6.8 5.6  NEUTROABS 10.8*  --   --   --   HGB 13.0 11.9* 10.9* 10.9*  HCT 40.4 37.4 34.0* 33.1*  MCV 101.5* 103.3* 103.0* 100.6*  PLT 358 247 253 [id number]   Basic Metabolic Panel: Recent Labs  Lab 06/08/19 1222 06/08/19 1237 06/09/19 0543 06/11/19 0719  NA 136  --  141 140  K 3.3*  --  4.3 3.8  CL 108  --  114* 114*  CO2 21*  --  19* 21*  GLUCOSE 271*  --  109* 86  BUN 20  --  22* 10  CREATININE 1.39*  --  1.05* 0.96  CALCIUM 8.2*  --  7.9* 8.5*   MG  --  2.1 1.9  --    GFR: Estimated Creatinine Clearance: 69.2 mL/min (by C-G formula based on SCr of 0.96 mg/dL). Liver Function Tests: Recent Labs  Lab 06/08/19 1222 06/09/19 0543  AST 79* 37  ALT 46* 33  ALKPHOS 59 40  BILITOT 0.9 1.2  PROT 6.5 5.8*  ALBUMIN 3.5 3.0*   No results for input(s): LIPASE, AMYLASE in the last 168 hours. No results for input(s): AMMONIA in the last 168 hours. Coagulation Profile: Recent Labs  Lab 06/09/19 0543  INR 1.0   Cardiac Enzymes: No results for input(s): CKTOTAL, CKMB, CKMBINDEX, TROPONINI in the last 168 hours. BNP (last 3 results) No results for input(s): PROBNP in the last 8760 hours. HbA1C: No results for input(s): HGBA1C in the last 72 hours. CBG: Recent Labs  Lab 06/08/19 1208  GLUCAP 314*   Lipid Profile: No results for input(s): CHOL, HDL, LDLCALC, TRIG, CHOLHDL, LDLDIRECT in the last 72 hours. Thyroid Function Tests: No results for input(s): TSH, T4TOTAL, FREET4, T3FREE, THYROIDAB in the last 72 hours. Anemia Panel: No results for input(s): VITAMINB12, FOLATE, FERRITIN, TIBC, IRON, RETICCTPCT in the last 72 hours. Sepsis Labs: No results for input(s): PROCALCITON, LATICACIDVEN in the last 168 hours.  Recent Results (from the past 240 hour(s))  Respiratory Panel by RT PCR (Flu A&B, Covid) -  Nasopharyngeal Swab     Status: None   Collection Time: 06/08/19  4:01 PM   Specimen: Nasopharyngeal Swab  Result Value Ref Range Status   SARS Coronavirus 2 by RT PCR NEGATIVE NEGATIVE Final    Comment: (NOTE) SARS-CoV-2 target nucleic acids are NOT DETECTED. The SARS-CoV-2 RNA is generally detectable in upper respiratoy specimens during the acute phase of infection. The lowest concentration of SARS-CoV-2 viral copies this assay can detect is 131 copies/mL. A negative result does not preclude SARS-Cov-2 infection and should not be used as the sole basis for treatment or other patient management decisions. A negative  result may occur with  improper specimen collection/handling, submission of specimen other than nasopharyngeal swab, presence of viral mutation(s) within the areas targeted by this assay, and inadequate number of viral copies (<131 copies/mL). A negative result must be combined with clinical observations, patient history, and epidemiological information. The expected result is Negative. Fact Sheet for Patients:  PinkCheek.be Fact Sheet for Healthcare Providers:  GravelBags.it This test is not yet ap proved or cleared by the [place] FDA and  has been authorized for detection and/or diagnosis of SARS-CoV-2 by FDA under an Emergency Use Authorization (EUA). This EUA will remain  in effect (meaning this test can be used) for the duration of the COVID-19 declaration under Section 564(b)(1) of the Act, 21 U.S.C. section 360bbb-3(b)(1), unless the authorization is terminated or revoked sooner.    Influenza A by PCR NEGATIVE NEGATIVE Final   Influenza B by PCR NEGATIVE NEGATIVE Final    Comment: (NOTE) The Xpert Xpress SARS-CoV-2/FLU/RSV assay is intended as an aid in  the diagnosis of influenza from Nasopharyngeal swab specimens and  should not be used as a sole basis for treatment. Nasal washings and  aspirates are unacceptable for Xpert Xpress SARS-CoV-2/FLU/RSV  testing. Fact Sheet for Patients: PinkCheek.be Fact Sheet for Healthcare Providers: GravelBags.it This test is not yet approved or cleared by the [place] FDA and  has been authorized for detection and/or diagnosis of SARS-CoV-2 by  FDA under an Emergency Use Authorization (EUA). This EUA will remain  in effect (meaning this test can be used) for the duration of the  Covid-19 declaration under Section 564(b)(1) of the Act, 21  U.S.C. section 360bbb-3(b)(1), unless the authorization is  terminated or  revoked. Performed at [facility], [place] [address] [place], [place] [address]   Culture, blood (routine x 2)     Status: None (Preliminary result)   Collection Time: 06/09/19  4:53 PM   Specimen: BLOOD LEFT HAND  Result Value Ref Range Status   Specimen Description   Final    BLOOD LEFT HAND Performed at [facility] [address], [place], [place] [address]    Special Requests   Final    BOTTLES DRAWN AEROBIC ONLY Blood Culture results may not be optimal due to an inadequate volume of blood received in culture bottles Performed at [facility] [address], [place], [place] [address]    Culture   Final    NO GROWTH 3 DAYS Performed at [facility], [place] [address], [place], [place] [address]    Report Status PENDING  Incomplete  Culture, blood (routine x 2)     Status: None (Preliminary result)   Collection Time: 06/09/19  4:53 PM   Specimen: BLOOD  Result Value Ref Range Status   Specimen Description   Final    BLOOD RIGHT ANTECUBITAL Performed at  [facility], [place] [address] [place], [place] [address]    Special Requests   Final    BOTTLES DRAWN AEROBIC AND ANAEROBIC Blood Culture adequate volume Performed at [facility] [address] [place], [place] [address]    Culture   Final    NO GROWTH 3 DAYS Performed at [facility], [place] [address] [place], [place] [address]    Report Status PENDING  Incomplete         Radiology Studies: No results found.      Scheduled Meds: . benztropine  2 mg Oral Daily  . bictegravir-emtricitabine-tenofovir AF  1 tablet Oral Daily  . cariprazine  4.5 mg Oral Daily  . enoxaparin (LOVENOX) injection  40 mg Subcutaneous Q24H  . gabapentin  300 mg Oral TID  . guaiFENesin  600 mg Oral BID  . naproxen  500 mg Oral BID WC  . pantoprazole  40 mg Oral Daily  . sertraline  150 mg Oral Daily  . valACYclovir  500 mg  Oral Daily   Continuous Infusions: . sodium chloride 100 mL/hr at 06/11/19 0042  . ampicillin-sulbactam (UNASYN) IV 3 g (06/12/19 0933)  . vancomycin       LOS: 3 days     Cordelia Poche, MD [facility] 06/12/2019, 11:01 AM  If 7PM-7AM, please contact night-coverage [email or web address]

## 2019-06-12 NOTE — Progress Notes (Signed)
PROGRESS NOTE    Yolanda Jones  [id number] DOB: [birth year] DOA: 06/08/2019 PCP: Patient, No Pcp Per   Brief Narrative: Yolanda Jones is a 39 y.o. female with polysubstance abuse, bipolar disorder, anxiety, history of HIV. Patient presented secondary to concern for seizures in the setting of substance use and found to have multifocal pneumonia.   Assessment & Plan:   Principal Problem:   Acute hypoxemic respiratory failure (HCC) Active Problems:   Human immunodeficiency virus (HIV) disease (HCC)   PTSD (post-traumatic stress disorder)   Bipolar 1 disorder, depressed, moderate (HCC)   Hypokalemia   Polysubstance abuse ([place])   Acute respiratory failure with hypoxia Secondary to multifocal pneumonia. Weaned to room air.  Multifocal pneumonia Concern for aspiration on admission secondary to seizure history and substance use. Given Ceftriaxone and Azithromycin in the ED and transitioned to Zosyn IV. Transthoracic Echocardiogram concerning for possible TV vegetation -ID: Vancomycin and Unasyn to cover for possible endocarditis   Abnormal Transthoracic Echocardiogram Possible TV vegetation. Patient has a remote history of IV drug use but nothing recent. Last use was 2005. Blood cultures obtained and are pending. Patient with multifocal pneumonia as mentioned above. -Transesophageal Echocardiogram pending -ID Recommendations  Possible seizure Witnessed by family. In setting of acute intoxication with possible fentanyl but patient is unsure of specific drug. No recurrent seizure-like activity since admission.  Metabolic vs toxic encephalopathy Possible post-ictal vs toxic from drug use. CT head negative. Resolved.  Hyperglycemia Hemoglobin A1C of 5.4%  Elevated creatinine Unknown baseline. Improvement with IV fluids.  Elevated LFTs Resolved.  HIV CD4 count is 148 currently, previous of 911 in November of 2020. Viral load in 12/2018 undetectable. Discussed with ID  who recommended discontinuing Bactrim DS as decrease in CD4 count likely related to acute infection rather than uncontrolled HIV. Viral load (4/30) is undetectable. -Continue Biktarvy  HSV -Continue Valacyclovir  Movement disorder Patient frequently moves legs. Noticed to have some tongue movement. 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 -Continue Cogentin  Bipolar disorder -Continue Vraylar  Elevated troponin Likely related to possible seizure. No chest pain. EKG without concerning ACS changes. Trended down.   DVT prophylaxis: Lovenox Code Status:   Code Status: Full Code Family Communication: None at bedside. Patient request for me to not discuss her care with family/friends Disposition Plan: Discharge pending continued workup of possible TV vegetation, weaning oxygen to room air, transition to oral antibiotics, ID recommendations.   Consultants:   Infectious disease  Procedures:   TRANSTHORACIC ECHOCARDIOGRAM (06/09/2019) IMPRESSIONS    1. The aortic valve leaflets are thickened and cannot rule out tiny  mobile density of the septal leaflet of the tricuspid valve. Would have a  low threshold to consider TEE to better evaluate.  2. Left ventricular ejection fraction, by estimation, is 60 to 65%. The  left ventricle has normal function. The left ventricle has no regional  wall motion abnormalities. Left ventricular diastolic parameters were  normal.  3. Right ventricular systolic function is normal. The right ventricular  size is mildly enlarged. There is normal pulmonary artery systolic  pressure.  4. The mitral valve is normal in structure. Trivial mitral valve  regurgitation. No evidence of mitral stenosis.  5. The aortic valve is tricuspid. Aortic valve regurgitation is trivial.  No aortic stenosis is present.  6. The inferior vena cava is dilated in size with >50% respiratory  variability, suggesting right atrial pressure of 8  mmHg.  Antimicrobials:  Ceftriaxone  Azithromycin  Zosyn  Subjective: Cough is improved. Feeling anxious. No other concerns.  Objective: Vitals:   06/11/19 1119 06/11/19 1300 06/11/19 2041 06/12/19 0700  BP: 103/72 113/76 123/85 (!) 102/59  Pulse: 91 82 65 63  Resp: 20 18 (!) 21 20  Temp: 98.7 F (37.1 C) 98 F (36.7 C) 99.4 F (37.4 C) 98.6 F (37 C)  TempSrc: Oral Oral Oral Oral  SpO2: 100% 99% 98% 98%  Weight:      Height:        Intake/Output Summary (Last 24 hours) at 06/12/2019 1101 Last data filed at 06/12/2019 0200 Gross per 24 hour  Intake 1219.27 ml  Output --  Net 1219.27 ml   Filed Weights   06/09/19 0602 06/10/19 0554 06/11/19 0538  Weight: 70.7 kg 71.2 kg 72.4 kg    Examination:  General exam: Appears calm and comfortable Respiratory system: Clear to auscultation. Respiratory effort normal. Cardiovascular system: S1 & S2 heard, RRR. No murmurs, rubs, gallops or clicks. Gastrointestinal system: Abdomen is nondistended, soft and nontender. No organomegaly or masses felt. Normal bowel sounds heard. Central nervous system: Alert and oriented. No focal neurological deficits. Extremities: No edema. No calf tenderness Skin: No cyanosis. No rashes Psychiatry: Judgement and insight appear normal. Mood & affect appropriate.    Data Reviewed: I have personally reviewed following labs and imaging studies  CBC: Recent Labs  Lab 06/08/19 1222 06/09/19 0543 06/11/19 0719 06/12/19 0527  WBC 12.0* 8.7 6.8 5.6  NEUTROABS 10.8*  --   --   --   HGB 13.0 11.9* 10.9* 10.9*  HCT 40.4 37.4 34.0* 33.1*  MCV 101.5* 103.3* 103.0* 100.6*  PLT 358 247 253 [id number]   Basic Metabolic Panel: Recent Labs  Lab 06/08/19 1222 06/08/19 1237 06/09/19 0543 06/11/19 0719  NA 136  --  141 140  K 3.3*  --  4.3 3.8  CL 108  --  114* 114*  CO2 21*  --  19* 21*  GLUCOSE 271*  --  109* 86  BUN 20  --  22* 10  CREATININE 1.39*  --  1.05* 0.96  CALCIUM 8.2*  --  7.9* 8.5*   MG  --  2.1 1.9  --    GFR: Estimated Creatinine Clearance: 69.2 mL/min (by C-G formula based on SCr of 0.96 mg/dL). Liver Function Tests: Recent Labs  Lab 06/08/19 1222 06/09/19 0543  AST 79* 37  ALT 46* 33  ALKPHOS 59 40  BILITOT 0.9 1.2  PROT 6.5 5.8*  ALBUMIN 3.5 3.0*   No results for input(s): LIPASE, AMYLASE in the last 168 hours. No results for input(s): AMMONIA in the last 168 hours. Coagulation Profile: Recent Labs  Lab 06/09/19 0543  INR 1.0   Cardiac Enzymes: No results for input(s): CKTOTAL, CKMB, CKMBINDEX, TROPONINI in the last 168 hours. BNP (last 3 results) No results for input(s): PROBNP in the last 8760 hours. HbA1C: No results for input(s): HGBA1C in the last 72 hours. CBG: Recent Labs  Lab 06/08/19 1208  GLUCAP 314*   Lipid Profile: No results for input(s): CHOL, HDL, LDLCALC, TRIG, CHOLHDL, LDLDIRECT in the last 72 hours. Thyroid Function Tests: No results for input(s): TSH, T4TOTAL, FREET4, T3FREE, THYROIDAB in the last 72 hours. Anemia Panel: No results for input(s): VITAMINB12, FOLATE, FERRITIN, TIBC, IRON, RETICCTPCT in the last 72 hours. Sepsis Labs: No results for input(s): PROCALCITON, LATICACIDVEN in the last 168 hours.  Recent Results (from the past 240 hour(s))  Respiratory Panel by RT PCR (Flu A&B, Covid) -  Nasopharyngeal Swab     Status: None   Collection Time: 06/08/19  4:01 PM   Specimen: Nasopharyngeal Swab  Result Value Ref Range Status   SARS Coronavirus 2 by RT PCR NEGATIVE NEGATIVE Final    Comment: (NOTE) SARS-CoV-2 target nucleic acids are NOT DETECTED. The SARS-CoV-2 RNA is generally detectable in upper respiratoy specimens during the acute phase of infection. The lowest concentration of SARS-CoV-2 viral copies this assay can detect is 131 copies/mL. A negative result does not preclude SARS-Cov-2 infection and should not be used as the sole basis for treatment or other patient management decisions. A negative  result may occur with  improper specimen collection/handling, submission of specimen other than nasopharyngeal swab, presence of viral mutation(s) within the areas targeted by this assay, and inadequate number of viral copies (<131 copies/mL). A negative result must be combined with clinical observations, patient history, and epidemiological information. The expected result is Negative. Fact Sheet for Patients:  PinkCheek.be Fact Sheet for Healthcare Providers:  GravelBags.it This test is not yet ap proved or cleared by the [place] FDA and  has been authorized for detection and/or diagnosis of SARS-CoV-2 by FDA under an Emergency Use Authorization (EUA). This EUA will remain  in effect (meaning this test can be used) for the duration of the COVID-19 declaration under Section 564(b)(1) of the Act, 21 U.S.C. section 360bbb-3(b)(1), unless the authorization is terminated or revoked sooner.    Influenza A by PCR NEGATIVE NEGATIVE Final   Influenza B by PCR NEGATIVE NEGATIVE Final    Comment: (NOTE) The Xpert Xpress SARS-CoV-2/FLU/RSV assay is intended as an aid in  the diagnosis of influenza from Nasopharyngeal swab specimens and  should not be used as a sole basis for treatment. Nasal washings and  aspirates are unacceptable for Xpert Xpress SARS-CoV-2/FLU/RSV  testing. Fact Sheet for Patients: PinkCheek.be Fact Sheet for Healthcare Providers: GravelBags.it This test is not yet approved or cleared by the [place] FDA and  has been authorized for detection and/or diagnosis of SARS-CoV-2 by  FDA under an Emergency Use Authorization (EUA). This EUA will remain  in effect (meaning this test can be used) for the duration of the  Covid-19 declaration under Section 564(b)(1) of the Act, 21  U.S.C. section 360bbb-3(b)(1), unless the authorization is  terminated or  revoked. Performed at [facility], [place] [address], [place], [place] [address]   Culture, blood (routine x 2)     Status: None (Preliminary result)   Collection Time: 06/09/19  4:53 PM   Specimen: BLOOD LEFT HAND  Result Value Ref Range Status   Specimen Description   Final    BLOOD LEFT HAND Performed at [facility] [address] [place], [place] [address]    Special Requests   Final    BOTTLES DRAWN AEROBIC ONLY Blood Culture results may not be optimal due to an inadequate volume of blood received in culture bottles Performed at [facility] [address], [place], [place] [address]    Culture   Final    NO GROWTH 3 DAYS Performed at [facility], [place] [name], [place], [place] [address]    Report Status PENDING  Incomplete  Culture, blood (routine x 2)     Status: None (Preliminary result)   Collection Time: 06/09/19  4:53 PM   Specimen: BLOOD  Result Value Ref Range Status   Specimen Description   Final    BLOOD RIGHT ANTECUBITAL Performed at  [facility], [place] [address], [place], [place] [address]    Special Requests   Final    BOTTLES DRAWN AEROBIC AND ANAEROBIC Blood Culture adequate volume Performed at [facility] [address] [place], [place] [address]    Culture   Final    NO GROWTH 3 DAYS Performed at [facility], [place] [address], [place],  [address]    Report Status PENDING  Incomplete         Radiology Studies: No results found.      Scheduled Meds: . benztropine  2 mg Oral Daily  . bictegravir-emtricitabine-tenofovir AF  1 tablet Oral Daily  . cariprazine  4.5 mg Oral Daily  . enoxaparin (LOVENOX) injection  40 mg Subcutaneous Q24H  . gabapentin  300 mg Oral TID  . guaiFENesin  600 mg Oral BID  . naproxen  500 mg Oral BID WC  . pantoprazole  40 mg Oral Daily  . sertraline  150 mg Oral Daily  . valACYclovir  500 mg  Oral Daily   Continuous Infusions: . sodium chloride 100 mL/hr at 06/11/19 0042  . ampicillin-sulbactam (UNASYN) IV 3 g (06/12/19 0933)  . vancomycin       LOS: 3 days     Cordelia Poche, MD [facility] 06/12/2019, 11:01 AM  If 7PM-7AM, please contact night-coverage [email or web address]

## 2019-06-13 ENCOUNTER — Inpatient Hospital Stay (HOSPITAL_COMMUNITY): Payer: Medicare Other | Admitting: Anesthesiology

## 2019-06-13 ENCOUNTER — Inpatient Hospital Stay (HOSPITAL_COMMUNITY): Payer: Medicare Other

## 2019-06-13 ENCOUNTER — Encounter (HOSPITAL_COMMUNITY): Payer: Self-pay | Admitting: Family Medicine

## 2019-06-13 ENCOUNTER — Encounter (HOSPITAL_COMMUNITY)
Admission: EM | Disposition: A | Discharge status: Home / Self Care | Payer: Self-pay | Source: Home / Self Care | Attending: Family Medicine

## 2019-06-13 DIAGNOSIS — I38 Endocarditis, valve unspecified: Secondary | ICD-10-CM

## 2019-06-13 HISTORY — PX: TEE WITHOUT CARDIOVERSION: SHX5443

## 2019-06-13 HISTORY — PX: BUBBLE STUDY: SHX6837

## 2019-06-13 LAB — CBC
HCT: 33.3 % — ABNORMAL LOW (ref 36.0–46.0)
Hemoglobin: 10.7 g/dL — ABNORMAL LOW (ref 12.0–15.0)
MCH: 32.2 pg (ref 26.0–34.0)
MCHC: 32.1 g/dL (ref 30.0–36.0)
MCV: 100.3 fL — ABNORMAL HIGH (ref 80.0–100.0)
Platelets: 271 10*3/uL (ref 150–400)
RBC: 3.32 MIL/uL — ABNORMAL LOW (ref 3.87–5.11)
RDW: 12.6 % (ref 11.5–15.5)
WBC: 4.9 10*3/uL (ref 4.0–10.5)
nRBC: 0 % (ref 0.0–0.2)

## 2019-06-13 SURGERY — ECHOCARDIOGRAM, TRANSESOPHAGEAL
Anesthesia: Monitor Anesthesia Care

## 2019-06-13 MED ORDER — AMOXICILLIN-POT CLAVULANATE 875-125 MG PO TABS: 1.0000 | ORAL_TABLET | Freq: Two times a day (BID) | ORAL | 0 refills | Status: AC

## 2019-06-13 MED ORDER — PROPOFOL 500 MG/50ML IV EMUL
INTRAVENOUS | Status: DC | PRN
  Administered 2019-06-13: 125 ug/kg/min via INTRAVENOUS

## 2019-06-13 MED ORDER — PROPOFOL 10 MG/ML IV BOLUS
INTRAVENOUS | Status: DC | PRN
  Administered 2019-06-13: 30 mg via INTRAVENOUS

## 2019-06-13 MED ORDER — BUTAMBEN-TETRACAINE-BENZOCAINE 2-2-14 % EX AERO
INHALATION_SPRAY | CUTANEOUS | Status: DC | PRN
  Administered 2019-06-13: 13:00:00 2 via TOPICAL

## 2019-06-13 NOTE — CV Procedure (Signed)
TEE  Patients throat anesthetized with Cetacaine spray Bite guard placed in mouth  Pt anesthetized by anesthesia with Propofol intravenously TEE probe advanced through mouth to mid esophagus without difficulty  LA, LAA without masses No PFO by color doppler or as tested by injection of agitated saline  MV normal  Trivial MR AV normal   No significant AI PV normal  Trivial PV TV normal   No masses seen   Very mild TR  LVEF and RVEF normal  Rare fixed atherosclerotic plaque in aorta.     Procedure was without complications   Yolanda Carnes MD

## 2019-06-13 NOTE — Anesthesia Procedure Notes (Signed)
Procedure Name: MAC Date/Time: 06/13/2019 12:30 PM Performed by: Jenne Campus, CRNA Pre-anesthesia Checklist: Patient identified, Emergency Drugs available, Suction available and Patient being monitored Oxygen Delivery Method: Simple face mask

## 2019-06-13 NOTE — Interval H&P Note (Signed)
History and Physical Interval Note:  06/13/2019 10:58 AM  Yolanda Jones  has presented today for surgery, with the diagnosis of BACTEREMIA.  The various methods of treatment have been discussed with the patient and family. After consideration of risks, benefits and other options for treatment, the patient has consented to  Procedure(s): TRANSESOPHAGEAL ECHOCARDIOGRAM (TEE) (N/A) as a surgical intervention.  The patient's history has been reviewed, patient examined, no change in status, stable for surgery.  I have reviewed the patient's chart and labs.  Questions were answered to the patient's satisfaction.     Dorris Carnes

## 2019-06-13 NOTE — Discharge Summary (Signed)
Physician Discharge Summary  Yolanda Jones [id number] DOB: 1980/12/11 DOA: 06/08/2019  PCP: Patient, No Pcp Per  Admit date: 06/08/2019 Discharge date: 06/13/2019  Admitted From: Home Disposition: Home  Recommendations for Outpatient Follow-up:  1. Follow up with PCP in 1 week 2. Please follow up on the following pending results: None  Home Health: None Equipment/Devices: None  Discharge Condition: Stable CODE STATUS: Full code Diet recommendation: Regular diet   Brief/Interim Summary:  Admission HPI written by Flora Lipps, MD   Chief Complaint: seizures  HPI: Yolanda Jones is a 39 y.o. female with polysubstance abuse, bipolar disorder, anxiety, history of HIV, presented to hospital after sustaining a seizure-like episode witnessed by the patient's friends.  EMS was called in at the hotel where she visited her friend.  Patient did have tremors and seizure-like activity.EMS noted patient to be hypoxic with decreased responsiveness.  Patient was then brought into the hospital. Ems administered Narcan with partial improvement in her mentation.  Patient however remained hypoxic and was initially put on nonrebreather mask.  Subsequently, she was weaned to 4 L of nasal cannula.  On my evaluation, patient complains of generalized body pain.  She admitted to doing crack cocaine and benzodiazepines.  Denies using heroin.  Patient complains of mild cough and chest discomfort.  Denies overt dyspnea but has some sputum production.  Patient states she was not sick prior to the event today.  She does not remember what happened to her today at the hotel.   Hospital course:  Acute respiratory failure with hypoxia Secondary to multifocal pneumonia. Weaned to room air.  Multifocal pneumonia Concern for aspiration on admission secondary to seizure history and substance use. Given Ceftriaxone and Azithromycin in the ED and transitioned to Zosyn IV. Eventually transitioned to Unasyn  and Vancomycin to also cover for possible heart vegetation. Patient recovered nicely and she was discharged to complete 7 more days of antibiotics on Augmentin.  Abnormal Transthoracic Echocardiogram Possible TV vegetation. Patient has a remote history of IV drug use but nothing recent. Last use was 2005. Blood cultures obtained and are pending. Patient with multifocal pneumonia as mentioned above. Transesophageal Echocardiogram was negative for vegetation.  Possible seizure Witnessed by family. In setting of acute intoxication with possible fentanyl but patient is unsure of specific drug. No recurrent seizure-like activity since admission. Cannot rule in or out seizure but if there was a true seizure, likely drug related.  Metabolic vs toxic encephalopathy Possible post-ictal vs toxic from drug use. CT head negative. Resolved.  Hyperglycemia Hemoglobin A1C of 5.4%  Elevated creatinine Unknown baseline. Improvement with IV fluids.  Elevated LFTs Resolved.  HIV CD4 count is 148 currently, previous of 911 in November of 2020. Viral load in 12/2018 undetectable. Discussed with ID who recommended discontinuing Bactrim DS as decrease in CD4 count likely related to acute infection rather than uncontrolled HIV. Viral load (4/30) is undetectable. Continue Biktarvy.  HSV Continue Valacyclovir  Movement disorder Patient frequently moves legs. Noticed to have some tongue movement. 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 Continue Cogentin.  Bipolar disorder Continue Vraylar  Elevated troponin Likely related to possible seizure. No chest pain. EKG without concerning ACS changes. Trended down.   Discharge Diagnoses:  Principal Problem:   Acute hypoxemic respiratory failure (HCC) Active Problems:   Human immunodeficiency virus (HIV) disease (HCC)   PTSD (post-traumatic stress disorder)   Bipolar 1 disorder, depressed, moderate (HCC)   Hypokalemia    Polysubstance abuse ([place])  Discharge Instructions   Allergies as of 06/13/2019      Reactions   Gabapentin    Pt denies, but doesn't like the way she feels- drunk, sluggish   Naproxen    Baclofen    Mumbled speech, AMD   Clindamycin/lincomycin Other (See Comments)   vomiting   Meloxicam Other (See Comments)   Swelling in face    Oxaprozin Other (See Comments)   Swelling in face    Prednisone Other (See Comments)   homicidal   Strattera [atomoxetine Hcl] Swelling   Tramadol Other (See Comments)   Seizure       Medication List    STOP taking these medications   ibuprofen 200 MG tablet Commonly known as: ADVIL     TAKE these medications   ALPRAZolam 1 MG tablet Commonly known as: XANAX Take 1 mg by mouth daily as needed.   amoxicillin-clavulanate 875-125 MG tablet Commonly known as: Augmentin Take 1 tablet by mouth 2 (two) times daily for 7 days.   benztropine 2 MG tablet Commonly known as: COGENTIN Take 2 mg by mouth daily.   Biktarvy 50-200-25 MG Tabs tablet Generic drug: bictegravir-emtricitabine-tenofovir AF TAKE 1 TABLET BY MOUTH DAILY   gabapentin 600 MG tablet Commonly known as: NEURONTIN Take 600 mg by mouth at bedtime.   levonorgestrel 20 MCG/24HR IUD Commonly known as: MIRENA 1 each by Intrauterine route once.   naproxen sodium 220 MG tablet Commonly known as: ALEVE Take 220 mg by mouth daily as needed (pain).   sertraline 100 MG tablet Commonly known as: ZOLOFT Take 150 mg by mouth daily.   valACYclovir 500 MG tablet Commonly known as: VALTREX TAKE 1 TABLET(500 MG) BY MOUTH DAILY What changed: See the new instructions.   Vraylar 4.5 MG Caps Generic drug: Cariprazine HCl Take 1 capsule by mouth at bedtime.      Follow-up [place] Follow up.   Why: You will need to go to the walk-in clinic between 8:30 and 3 M-F for an assessment in order to access the "telehealth intensive substance abuse  program."  Bring your ID, SS card, insurance card. Contact information: [place] [address] [id number]          Allergies  Allergen Reactions  . Gabapentin     Pt denies, but doesn't like the way she feels- drunk, sluggish  . Naproxen   . Baclofen     Mumbled speech, AMD  . Clindamycin/Lincomycin Other (See Comments)    vomiting  . Meloxicam Other (See Comments)    Swelling in face   . Oxaprozin Other (See Comments)    Swelling in face   . Prednisone Other (See Comments)    homicidal  . Strattera [Atomoxetine Hcl] Swelling  . Tramadol Other (See Comments)    Seizure     Consultations:  Infectious disease   Procedures/Studies: CT Head Wo Contrast  Result Date: 06/08/2019 CLINICAL DATA:  Possible seizure, altered mental status EXAM: CT HEAD WITHOUT CONTRAST TECHNIQUE: Contiguous axial images were obtained from the base of the skull through the vertex without intravenous contrast. COMPARISON:  Correlation made with 2020 brain MRI FINDINGS: Brain: There is no acute intracranial hemorrhage, mass effect, or edema. Gray-white differentiation is preserved. There is no extra-axial fluid collection. Ventricles and sulci are within normal limits in size and configuration. Vascular: No hyperdense vessel or unexpected calcification. Skull: Calvarium is unremarkable. Sinuses/Orbits: Nonspecific small left maxillary sinus air-fluid level is partially imaged.  Orbits are unremarkable. Other: Mastoid air cells are clear. IMPRESSION: No acute intracranial hemorrhage, mass effect, or evidence of acute infarction. Electronically Signed   By: Macy Mis M.D.   On: 06/08/2019 14:40   CT Angio Chest PE W and/or Wo Contrast  Result Date: 06/08/2019 CLINICAL DATA:  Seizure, hypoxia EXAM: CT ANGIOGRAPHY CHEST WITH CONTRAST TECHNIQUE: Multidetector CT imaging of the chest was performed using the standard protocol during bolus administration of intravenous contrast. Multiplanar CT  image reconstructions and MIPs were obtained to evaluate the vascular anatomy. CONTRAST:  125mL OMNIPAQUE IOHEXOL 350 MG/ML SOLN COMPARISON:  04/29/2018 FINDINGS: Cardiovascular: Satisfactory opacification of the pulmonary arteries to the proximal segmental level. No evidence of pulmonary embolism. Normal heart size. No pericardial effusion. Mediastinum/Nodes: No enlarged lymph nodes identified. Visualized thyroid is unremarkable, noting presence of artifact. Lungs/Pleura: Dense consolidation in the lower lobes bilaterally with additional involvement of bilateral upper lobes. No pleural effusion. No pneumothorax. Upper Abdomen: Cholecystectomy clips.  No acute abnormality. Musculoskeletal: No acute osseous abnormality. Apparent fracture of the anterolateral right fourth rib is favored to reflect motion artifact. Review of the MIP images confirms the above findings. IMPRESSION: No evidence of acute pulmonary embolism. Multifocal pneumonia. Electronically Signed   By: Macy Mis M.D.   On: 06/08/2019 14:36   CT Cervical Spine Wo Contrast  Result Date: 06/08/2019 CLINICAL DATA:  Fall, possible seizure EXAM: CT CERVICAL SPINE WITHOUT CONTRAST TECHNIQUE: Multidetector CT imaging of the cervical spine was performed without intravenous contrast. Multiplanar CT image reconstructions were also generated. COMPARISON:  None. FINDINGS: Alignment: Anteroposterior alignment is maintained. Skull base and vertebrae: There is no acute cervical spine fracture. Soft tissues and spinal canal: No prevertebral fluid or swelling. No visible canal hematoma. Disc levels: Minor degenerative changes are present, greatest at C4-C5 and C5-C6. Upper chest: Refer to concurrent dedicated chest imaging. Other: None. IMPRESSION: No acute cervical spine fracture. Electronically Signed   By: Macy Mis M.D.   On: 06/08/2019 14:42   CT Lumbar Spine Wo Contrast  Result Date: 06/08/2019 CLINICAL DATA:  Seizure, fall, low back pain EXAM:  CT LUMBAR SPINE WITHOUT CONTRAST TECHNIQUE: Multidetector CT imaging of the lumbar spine was performed without intravenous contrast administration. Multiplanar CT image reconstructions were also generated. COMPARISON:  None. FINDINGS: Segmentation: 5 lumbar type vertebrae. Alignment: Anteroposterior alignment is maintained. Vertebrae: There is no acute fracture. No destructive osseous lesion. Paraspinal and other soft tissues: Negative. Disc levels: Probable disc bulge with small right foraminal protrusion, endplate osteophytic ridging, and facet hypertrophy at L4-L5 resulting in left foraminal stenosis. There is vacuum disc phenomenon at L5-S1 with disc bulge, endplate osteophytic ridging, and facet hypertrophy resulting in right greater than left foraminal stenosis. No apparent canal stenosis at any level. IMPRESSION: No acute lumbar spine fracture. Mild lower lumbar degenerative changes. Electronically Signed   By: Macy Mis M.D.   On: 06/08/2019 14:30   DG Chest Portable 1 View  Result Date: 06/08/2019 CLINICAL DATA:  Shortness of breath, hypoxia. EXAM: PORTABLE CHEST 1 VIEW COMPARISON:  April 29, 2018. FINDINGS: The heart size and mediastinal contours are within normal limits. No pneumothorax or pleural effusion is noted. Bilateral perihilar and upper lobe opacities are noted concerning for edema or possibly infiltrates. The visualized skeletal structures are unremarkable. IMPRESSION: Bilateral perihilar and upper lobe opacities are noted concerning for edema or possibly infiltrates. Electronically Signed   By: Marijo Conception M.D.   On: 06/08/2019 12:37   ECHOCARDIOGRAM COMPLETE  Result Date: 06/09/2019  ECHOCARDIOGRAM REPORT   Patient Name:   ESTALEE MARKOWICZ Date of Exam: 06/09/2019 Medical Rec #:  [id number]         Height:       60.0 in Accession #:    [id number]        Weight:       155.9 lb Date of Birth:  March 15, 1980         BSA:          1.679 m Patient Age:    39 years          BP:            86/59 mmHg Patient Gender: F                 HR:           84 bpm. Exam Location:  Inpatient Procedure: 2D Echo Indications:    chest pain 786.50  History:        Patient has no prior history of Echocardiogram examinations.                 Substance abuse. HIV.  Sonographer:    Jannett Celestine RDCS (AE) Referring Phys: [phone] [place]  1. The aortic valve leaflets are thickened and cannot rule out tiny mobile density of the septal leaflet of the tricuspid valve. Would have a low threshold to consider TEE to better evaluate.  2. Left ventricular ejection fraction, by estimation, is 60 to 65%. The left ventricle has normal function. The left ventricle has no regional wall motion abnormalities. Left ventricular diastolic parameters were normal.  3. Right ventricular systolic function is normal. The right ventricular size is mildly enlarged. There is normal pulmonary artery systolic pressure.  4. The mitral valve is normal in structure. Trivial mitral valve regurgitation. No evidence of mitral stenosis.  5. The aortic valve is tricuspid. Aortic valve regurgitation is trivial. No aortic stenosis is present.  6. The inferior vena cava is dilated in size with >50% respiratory variability, suggesting right atrial pressure of 8 mmHg. FINDINGS  Left Ventricle: Left ventricular ejection fraction, by estimation, is 60 to 65%. The left ventricle has normal function. The left ventricle has no regional wall motion abnormalities. The left ventricular internal cavity size was normal in size. There is  no left ventricular hypertrophy. Left ventricular diastolic parameters were normal. Indeterminate filling pressures. Right Ventricle: The right ventricular size is mildly enlarged. No increase in right ventricular wall thickness. Right ventricular systolic function is normal. There is normal pulmonary artery systolic pressure. The tricuspid regurgitant velocity is 2.58  m/s, and with an assumed right atrial pressure  of 8 mmHg, the estimated right ventricular systolic pressure is [id number] mmHg. Left Atrium: Left atrial size was normal in size. Right Atrium: Right atrial size was normal in size. Pericardium: There is no evidence of pericardial effusion. Mitral Valve: The mitral valve is normal in structure. Normal mobility of the mitral valve leaflets. Trivial mitral valve regurgitation. No evidence of mitral valve stenosis. Tricuspid Valve: The tricuspid valve is normal in structure. Tricuspid valve regurgitation is trivial. No evidence of tricuspid stenosis. Aortic Valve: The aortic valve is tricuspid. . There is mild thickening of the aortic valve. Aortic valve regurgitation is trivial. No aortic stenosis is present. There is mild thickening of the aortic valve. Pulmonic Valve: The pulmonic valve was normal in structure. Pulmonic valve regurgitation is not visualized. No evidence of pulmonic stenosis. Aorta: The aortic root is normal in  size and structure. Venous: The inferior vena cava is dilated in size with greater than 50% respiratory variability, suggesting right atrial pressure of 8 mmHg. IAS/Shunts: No atrial level shunt detected by color flow Doppler.  LEFT VENTRICLE PLAX 2D LVIDd:         4.40 cm  Diastology LVIDs:         2.90 cm  LV e' lateral:   10.90 cm/s LV PW:         1.30 cm  LV E/e' lateral: 7.8 LV IVS:        0.70 cm  LV e' medial:    7.62 cm/s LVOT diam:     1.70 cm  LV E/e' medial:  11.2 LV SV:         51 LV SV Index:   30 LVOT Area:     2.27 cm  RIGHT VENTRICLE RV S prime:     8.59 cm/s TAPSE (M-mode): 1.6 cm LEFT ATRIUM             Index       RIGHT ATRIUM           Index LA diam:        3.20 cm 1.91 cm/m  RA Area:     14.40 cm LA Vol (A2C):   22.4 ml 13.34 ml/m RA Volume:   34.10 ml  20.31 ml/m LA Vol (A4C):   26.1 ml 15.55 ml/m LA Biplane Vol: 26.6 ml 15.84 ml/m  AORTIC VALVE LVOT Vmax:   115.00 cm/s LVOT Vmean:  78.100 cm/s LVOT VTI:    0.225 m  AORTA Ao Root diam: 2.80 cm MITRAL VALVE                TRICUSPID VALVE MV Area (PHT): 3.39 cm    TR Peak grad:   26.6 mmHg MV Decel Time: 224 msec    TR Vmax:        258.00 cm/s MV E velocity: 85.30 cm/s MV A velocity: 65.60 cm/s  SHUNTS MV E/A ratio:  1.30        Systemic VTI:  0.22 m                            Systemic Diam: 1.70 cm Skeet Latch MD Electronically signed by Skeet Latch MD Signature Date/Time: 06/09/2019/3:58:03 PM    Final      TRANSTHORACIC ECHOCARDIOGRAM (06/09/2019) IMPRESSIONS    1. The aortic valve leaflets are thickened and cannot rule out tiny  mobile density of the septal leaflet of the tricuspid valve. Would have a  low threshold to consider TEE to better evaluate.  2. Left ventricular ejection fraction, by estimation, is 60 to 65%. The  left ventricle has normal function. The left ventricle has no regional  wall motion abnormalities. Left ventricular diastolic parameters were  normal.  3. Right ventricular systolic function is normal. The right ventricular  size is mildly enlarged. There is normal pulmonary artery systolic  pressure.  4. The mitral valve is normal in structure. Trivial mitral valve  regurgitation. No evidence of mitral stenosis.  5. The aortic valve is tricuspid. Aortic valve regurgitation is trivial.  No aortic stenosis is present.  6. The inferior vena cava is dilated in size with >50% respiratory  variability, suggesting right atrial pressure of 8 mmHg.  TRANSESOPHAGEAL ECHOCARDIOGRAM (06/13/2019) IMPRESSIONS    1. No vegetations noted.  2. Left ventricular ejection fraction, by estimation, is 60  to 65%. The  left ventricle has normal function. The left ventricle has no regional  wall motion abnormalities.  3. Right ventricular systolic function is normal. The right ventricular  size is normal.  4. No left atrial/left atrial appendage thrombus was detected.  5. The mitral valve is normal in structure. Trivial mitral valve  regurgitation.  6. The aortic valve is normal  in structure. Aortic valve regurgitation is  not visualized.    Subjective: Breathing much better. Cough is improved.  Discharge Exam: Vitals:   06/13/19 0524 06/13/19 1130  BP: 108/74 120/81  Pulse: (!) 46 (!) 59  Resp: 20 19  Temp: 98.4 F (36.9 C) 97.9 F (36.6 C)  SpO2: 97% 98%   Vitals:   06/12/19 2046 06/13/19 0524 06/13/19 0600 06/13/19 1130  BP: 130/90 108/74  120/81  Pulse: 62 (!) 46  (!) 59  Resp: 20 20  19   Temp: 98.9 F (37.2 C) 98.4 F (36.9 C)  97.9 F (36.6 C)  TempSrc: Oral Oral  Temporal  SpO2: 98% 97%  98%  Weight:   71.9 kg   Height:        General: Pt is alert, awake, not in acute distress Cardiovascular: RRR, S1/S2 +, no rubs, no gallops Respiratory: CTA bilaterally, no wheezing, no rhonchi Abdominal: Soft, NT, ND, bowel sounds + Extremities: no edema, no cyanosis    The results of significant diagnostics from this hospitalization (including imaging, microbiology, ancillary and laboratory) are listed below for reference.     Microbiology: Recent Results (from the past 240 hour(s))  Respiratory Panel by RT PCR (Flu A&B, Covid) - Nasopharyngeal Swab     Status: None   Collection Time: 06/08/19  4:01 PM   Specimen: Nasopharyngeal Swab  Result Value Ref Range Status   SARS Coronavirus 2 by RT PCR NEGATIVE NEGATIVE Final    Comment: (NOTE) SARS-CoV-2 target nucleic acids are NOT DETECTED. The SARS-CoV-2 RNA is generally detectable in upper respiratoy specimens during the acute phase of infection. The lowest concentration of SARS-CoV-2 viral copies this assay can detect is 131 copies/mL. A negative result does not preclude SARS-Cov-2 infection and should not be used as the sole basis for treatment or other patient management decisions. A negative result may occur with  improper specimen collection/handling, submission of specimen other than nasopharyngeal swab, presence of viral mutation(s) within the areas targeted by this assay, and  inadequate number of viral copies (<131 copies/mL). A negative result must be combined with clinical observations, patient history, and epidemiological information. The expected result is Negative. Fact Sheet for Patients:  PinkCheek.be Fact Sheet for Healthcare Providers:  GravelBags.it This test is not yet ap proved or cleared by the [place] FDA and  has been authorized for detection and/or diagnosis of SARS-CoV-2 by FDA under an Emergency Use Authorization (EUA). This EUA will remain  in effect (meaning this test can be used) for the duration of the COVID-19 declaration under Section 564(b)(1) of the Act, 21 U.S.C. section 360bbb-3(b)(1), unless the authorization is terminated or revoked sooner.    Influenza A by PCR NEGATIVE NEGATIVE Final   Influenza B by PCR NEGATIVE NEGATIVE Final    Comment: (NOTE) The Xpert Xpress SARS-CoV-2/FLU/RSV assay is intended as an aid in  the diagnosis of influenza from Nasopharyngeal swab specimens and  should not be used as a sole basis for treatment. Nasal washings and  aspirates are unacceptable for Xpert Xpress SARS-CoV-2/FLU/RSV  testing. Fact Sheet for Patients: PinkCheek.be Fact Sheet for  Healthcare Providers: GravelBags.it This test is not yet approved or cleared by the [place] and  has been authorized for detection and/or diagnosis of SARS-CoV-2 by  FDA under an Emergency Use Authorization (EUA). This EUA will remain  in effect (meaning this test can be used) for the duration of the  Covid-19 declaration under Section 564(b)(1) of the Act, 21  U.S.C. section 360bbb-3(b)(1), unless the authorization is  terminated or revoked. Performed at [facility], [place] [address], [place], [place] [address]   Culture, blood (routine x 2)     Status: None (Preliminary result)   Collection Time:  06/09/19  4:53 PM   Specimen: BLOOD LEFT HAND  Result Value Ref Range Status   Specimen Description   Final    BLOOD LEFT HAND Performed at [facility] [address], [place], [place] [address]    Special Requests   Final    BOTTLES DRAWN AEROBIC ONLY Blood Culture results may not be optimal due to an inadequate volume of blood received in culture bottles Performed at [facility] [address] [place], [place] [address]    Culture   Final    NO GROWTH 4 DAYS Performed at [facility], [place] [address] [place], [place] [address]    Report Status PENDING  Incomplete  Culture, blood (routine x 2)     Status: None (Preliminary result)   Collection Time: 06/09/19  4:53 PM   Specimen: BLOOD  Result Value Ref Range Status   Specimen Description   Final    BLOOD RIGHT ANTECUBITAL Performed at [facility] [address], [place], [place] [address]    Special Requests   Final    BOTTLES DRAWN AEROBIC AND ANAEROBIC Blood Culture adequate volume Performed at [facility] [address] [place], [place] [address]    Culture   Final    NO GROWTH 4 DAYS Performed at [facility], [place] [address], [place], [place] [address]    Report Status PENDING  Incomplete     Labs: BNP (last 3 results) Recent Labs    06/08/19 1214  BNP [id number]   Basic Metabolic Panel: Recent Labs  Lab 06/08/19 1222 06/08/19 1237 06/09/19 0543 06/11/19 0719  NA 136  --  141 140  K 3.3*  --  4.3 3.8  CL 108  --  114* 114*  CO2 21*  --  19* 21*  GLUCOSE 271*  --  109* 86  BUN 20  --  22* 10  CREATININE 1.39*  --  1.05* 0.96  CALCIUM 8.2*  --  7.9* 8.5*  MG  --  2.1 1.9  --    Liver Function Tests: Recent Labs  Lab 06/08/19 1222 06/09/19 0543  AST 79* 37  ALT 46* 33  ALKPHOS 59 40  BILITOT 0.9 1.2  PROT 6.5 5.8*  ALBUMIN 3.5 3.0*   No results for input(s): LIPASE, AMYLASE in the last 168 hours. No  results for input(s): AMMONIA in the last 168 hours. CBC: Recent Labs  Lab 06/08/19 1222 06/09/19 0543 06/11/19 0719 06/12/19 0527 06/13/19 0516  WBC 12.0* 8.7 6.8 5.6 4.9  NEUTROABS 10.8*  --   --   --   --   HGB 13.0 11.9* 10.9* 10.9* 10.7*  HCT 40.4 37.4 34.0* 33.1* 33.3*  MCV 101.5* 103.3* 103.0* 100.6* 100.3*  PLT 358 247 253 279 271   Cardiac Enzymes: No results  for input(s): CKTOTAL, CKMB, CKMBINDEX, TROPONINI in the last 168 hours. BNP: Invalid input(s): POCBNP CBG: Recent Labs  Lab 06/08/19 1208  GLUCAP 314*   D-Dimer No results for input(s): DDIMER in the last 72 hours. Hgb A1c No results for input(s): HGBA1C in the last 72 hours. Lipid Profile No results for input(s): CHOL, HDL, LDLCALC, TRIG, CHOLHDL, LDLDIRECT in the last 72 hours. Thyroid function studies No results for input(s): TSH, T4TOTAL, T3FREE, THYROIDAB in the last 72 hours.  Invalid input(s): FREET3 Anemia work up No results for input(s): VITAMINB12, FOLATE, FERRITIN, TIBC, IRON, RETICCTPCT in the last 72 hours. Urinalysis    Component Value Date/Time   COLORURINE YELLOW 06/08/2019 1215   APPEARANCEUR CLEAR 06/08/2019 1215   LABSPEC 1.008 06/08/2019 1215   PHURINE 6.0 06/08/2019 1215   GLUCOSEU >=500 (A) 06/08/2019 1215   HGBUR NEGATIVE 06/08/2019 1215   BILIRUBINUR NEGATIVE 06/08/2019 1215   KETONESUR NEGATIVE 06/08/2019 1215   PROTEINUR NEGATIVE 06/08/2019 1215   UROBILINOGEN 0.2 12/09/2010 1233   NITRITE NEGATIVE 06/08/2019 1215   LEUKOCYTESUR NEGATIVE 06/08/2019 1215   Sepsis Labs Invalid input(s): PROCALCITONIN,  WBC,  LACTICIDVEN Microbiology Recent Results (from the past 240 hour(s))  Respiratory Panel by RT PCR (Flu A&B, Covid) - Nasopharyngeal Swab     Status: None   Collection Time: 06/08/19  4:01 PM   Specimen: Nasopharyngeal Swab  Result Value Ref Range Status   SARS Coronavirus 2 by RT PCR NEGATIVE NEGATIVE Final    Comment: (NOTE) SARS-CoV-2 target nucleic acids are  NOT DETECTED. The SARS-CoV-2 RNA is generally detectable in upper respiratoy specimens during the acute phase of infection. The lowest concentration of SARS-CoV-2 viral copies this assay can detect is 131 copies/mL. A negative result does not preclude SARS-Cov-2 infection and should not be used as the sole basis for treatment or other patient management decisions. A negative result may occur with  improper specimen collection/handling, submission of specimen other than nasopharyngeal swab, presence of viral mutation(s) within the areas targeted by this assay, and inadequate number of viral copies (<131 copies/mL). A negative result must be combined with clinical observations, patient history, and epidemiological information. The expected result is Negative. Fact Sheet for Patients:  PinkCheek.be Fact Sheet for Healthcare Providers:  GravelBags.it This test is not yet ap proved or cleared by the [place] FDA and  has been authorized for detection and/or diagnosis of SARS-CoV-2 by FDA under an Emergency Use Authorization (EUA). This EUA will remain  in effect (meaning this test can be used) for the duration of the COVID-19 declaration under Section 564(b)(1) of the Act, 21 U.S.C. section 360bbb-3(b)(1), unless the authorization is terminated or revoked sooner.    Influenza A by PCR NEGATIVE NEGATIVE Final   Influenza B by PCR NEGATIVE NEGATIVE Final    Comment: (NOTE) The Xpert Xpress SARS-CoV-2/FLU/RSV assay is intended as an aid in  the diagnosis of influenza from Nasopharyngeal swab specimens and  should not be used as a sole basis for treatment. Nasal washings and  aspirates are unacceptable for Xpert Xpress SARS-CoV-2/FLU/RSV  testing. Fact Sheet for Patients: PinkCheek.be Fact Sheet for Healthcare Providers: GravelBags.it This test is not yet approved or  cleared by the [place] FDA and  has been authorized for detection and/or diagnosis of SARS-CoV-2 by  FDA under an Emergency Use Authorization (EUA). This EUA will remain  in effect (meaning this test can be used) for the duration of the  Covid-19 declaration under Section 564(b)(1) of the Act, 21  U.S.C. section 360bbb-3(b)(1), unless the authorization is  terminated or revoked. Performed at [facility], [place] [address] [place], [place] [address]   Culture, blood (routine x 2)     Status: None (Preliminary result)   Collection Time: 06/09/19  4:53 PM   Specimen: BLOOD LEFT HAND  Result Value Ref Range Status   Specimen Description   Final    BLOOD LEFT HAND Performed at [facility] [address] [place], [place] [address]    Special Requests   Final    BOTTLES DRAWN AEROBIC ONLY Blood Culture results may not be optimal due to an inadequate volume of blood received in culture bottles Performed at [facility] [address] [place], [place] [address]    Culture   Final    NO GROWTH 4 DAYS Performed at [facility], [place] [address] [place], [place] [address]    Report Status PENDING  Incomplete  Culture, blood (routine x 2)     Status: None (Preliminary result)   Collection Time: 06/09/19  4:53 PM   Specimen: BLOOD  Result Value Ref Range Status   Specimen Description   Final    BLOOD RIGHT ANTECUBITAL Performed at [facility] [address] [place], [place] [address]    Special Requests   Final    BOTTLES DRAWN AEROBIC AND ANAEROBIC Blood Culture adequate volume Performed at [facility] [address], [place], [place] [address]    Culture   Final    NO GROWTH 4 DAYS Performed at [facility], [place] [address] [place], [place] [address]    Report Status PENDING  Incomplete     Time coordinating discharge: 35 minutes  SIGNED:   Cordelia Poche,  MD [facility] 06/13/2019, 12:09 PM

## 2019-06-13 NOTE — Transfer of Care (Signed)
Immediate Anesthesia Transfer of Care Note  Patient: Yolanda Jones  Procedure(s) Performed: TRANSESOPHAGEAL ECHOCARDIOGRAM (TEE) (N/A ) BUBBLE STUDY  Patient Location: Endoscopy Unit  Anesthesia Type:MAC  Level of Consciousness: drowsy  Airway & Oxygen Therapy: Patient Spontanous Breathing and Patient connected to face mask oxygen  Post-op Assessment: Report given to RN and Post -op Vital signs reviewed and stable  Post vital signs: Reviewed and stable  Last Vitals:  Vitals Value Taken Time  BP 89/47 06/13/19 1300  Temp    Pulse 73 06/13/19 1301  Resp 16 06/13/19 1301  SpO2 96 % 06/13/19 1301  Vitals shown include unvalidated device data.  Last Pain:  Vitals:   06/13/19 1258  TempSrc:   PainSc: 0-No pain      Patients Stated Pain Goal: 2 (40/10/27 2536)  Complications: No apparent anesthesia complications

## 2019-06-13 NOTE — Progress Notes (Signed)
  Echocardiogram Echocardiogram Transesophageal has been performed.  Jennette Dubin 06/13/2019, 1:30 PM

## 2019-06-13 NOTE — TOC Progression Note (Signed)
Transition of Care [facility]) - Progression Note    Patient Details  Name: Yolanda Jones MRN: [id number] Date of Birth: February 18, 1980  Transition of Care [facility]) CM/SW [place], [place] Phone Number: 06/13/2019, 9:50 AM  Clinical Narrative:   Went to see patient in response to request by RN re: resources at d/c.  [name] stated she was contemplating going inpatient drug rehab, but after seeing Dr this AM and hearing that she should limit her exposure to others if possible, she has decided to stick with plan A.  No further needs identified. TOC will continue to follow during the course of hospitalization.     Expected Discharge Plan: Home/Self Care Barriers to Discharge: No Barriers Identified  Expected Discharge Plan and Services Expected Discharge Plan: Home/Self Care In-house Referral: Clinical Social Work     Living arrangements for the past 2 months: Apartment                                       Social Determinants of Health (SDOH) Interventions    Readmission Risk Interventions No flowsheet data found.

## 2019-06-13 NOTE — Progress Notes (Signed)
Yolanda Jones for Infectious Disease    Date of Admission:  06/08/2019   Total days of antibiotics 6           ID: Yolanda Jones is a 39 y.o. female with pneumonia in setting of illicit drug use concerning for endocarditis due to abn TTE Principal Problem:   Acute hypoxemic respiratory failure (HCC) Active Problems:   Human immunodeficiency virus (HIV) disease (HCC)   PTSD (post-traumatic stress disorder)   Bipolar 1 disorder, depressed, moderate (HCC)   Hypokalemia   Polysubstance abuse (HCC)    Subjective: Underwent TEE without any vegetation. Mild sore throat  Medications:  . benztropine  2 mg Oral Daily  . bictegravir-emtricitabine-tenofovir AF  1 tablet Oral Daily  . cariprazine  4.5 mg Oral Daily  . enoxaparin (LOVENOX) injection  40 mg Subcutaneous Q24H  . gabapentin  300 mg Oral TID  . guaiFENesin  600 mg Oral BID  . naproxen  500 mg Oral BID WC  . pantoprazole  40 mg Oral Daily  . sertraline  150 mg Oral Daily  . valACYclovir  500 mg Oral Daily    Objective: Vital signs in last 24 hours: Temp:  [97.9 F (36.6 C)-98.9 F (37.2 C)] 98.2 F (36.8 C) (05/03 1410) Pulse Rate:  [25-106] 53 (05/03 1410) Resp:  [15-29] 16 (05/03 1410) BP: (89-132)/(39-98) 132/98 (05/03 1410) SpO2:  [77 %-100 %] 100 % (05/03 1410) Weight:  [71.9 kg] 71.9 kg (05/03 0600)  Physical Exam  Constitutional:  oriented to person, place, and time. appears well-developed and well-nourished. No distress.  HENT: [place]/AT, PERRLA, no scleral icterus Mouth/Throat: Oropharynx is clear and moist. No oropharyngeal exudate.  Cardiovascular: Normal rate, regular rhythm and normal heart sounds. Exam reveals no gallop and no friction rub.  No murmur heard.  Pulmonary/Chest: Effort normal and breath sounds normal. No respiratory distress.  has no wheezes.  Neck = supple, no nuchal rigidity Abdominal: Soft. Bowel sounds are normal.  exhibits no distension. There is no tenderness.  Neurological:  alert and oriented to person, place, and time.  Skin: Skin is warm and dry. No rash noted. No erythema.  Psychiatric: a normal mood and affect.  behavior is normal.    Lab Results Recent Labs    06/11/19 0719 06/11/19 0719 06/12/19 0527 06/13/19 0516  WBC 6.8   < > 5.6 4.9  HGB 10.9*   < > 10.9* 10.7*  HCT 34.0*   < > 33.1* 33.3*  NA 140  --   --   --   K 3.8  --   --   --   CL 114*  --   --   --   CO2 21*  --   --   --   BUN 10  --   --   --   CREATININE 0.96  --   --   --    < > = values in this interval not displayed.   Microbiology: reviewed Studies/Results: ECHO TEE  Result Date: 06/13/2019    TRANSESOPHOGEAL ECHO REPORT   Patient Name:   Yolanda Jones Date of Exam: 06/13/2019 Medical Rec #:  [id number]         Height:       60.0 in Accession #:    [id number]        Weight:       158.5 lb Date of Birth:  May 30, 1980         BSA:  1.691 m Patient Age:    38 years          BP:           125/87 mmHg Patient Gender: F                 HR:           62 bpm. Exam Location:  Inpatient Procedure: Transesophageal Echo, Color Doppler and Saline Contrast Bubble Study Indications:     Endocarditis  History:         Patient has prior history of Echocardiogram examinations, most                  recent 06/09/2019. Risk Factors:Substance Abuse.  Sonographer:     Mikki Santee RDCS (AE) Referring Phys:  [id number] Tami Lin [facility] Diagnosing Phys: Dorris Carnes MD PROCEDURE: After discussion of the risks and benefits of a TEE, an informed consent was obtained. The transesophogeal probe was passed without difficulty through the esophogus of the patient. Sedation performed by different physician. The patient was monitored while under deep sedation. Anesthestetic sedation was provided intravenously by Anesthesiology: 151.15mg  of Propofol. The patient developed no complications during the procedure. IMPRESSIONS  1. No vegetations noted.  2. Left ventricular ejection fraction, by estimation, is 60  to 65%. The left ventricle has normal function. The left ventricle has no regional wall motion abnormalities.  3. Right ventricular systolic function is normal. The right ventricular size is normal.  4. No left atrial/left atrial appendage thrombus was detected.  5. The mitral valve is normal in structure. Trivial mitral valve regurgitation.  6. The aortic valve is normal in structure. Aortic valve regurgitation is not visualized. FINDINGS  Left Ventricle: Left ventricular ejection fraction, by estimation, is 60 to 65%. The left ventricle has normal function. The left ventricle has no regional wall motion abnormalities. The left ventricular internal cavity size was normal in size. There is  no left ventricular hypertrophy. Right Ventricle: The right ventricular size is normal. No increase in right ventricular wall thickness. Right ventricular systolic function is normal. Left Atrium: Left atrial size was normal in size. No left atrial/left atrial appendage thrombus was detected. Right Atrium: Right atrial size was normal in size. Pericardium: There is no evidence of pericardial effusion. Mitral Valve: The mitral valve is normal in structure. Trivial mitral valve regurgitation. Tricuspid Valve: The tricuspid valve is normal in structure. Tricuspid valve regurgitation is trivial. Aortic Valve: The aortic valve is normal in structure. Aortic valve regurgitation is not visualized. Pulmonic Valve: The pulmonic valve was normal in structure. Pulmonic valve regurgitation is mild. Aorta: The aortic root, ascending aorta, aortic arch and descending aorta are all structurally normal, with no evidence of dilitation or obstruction. IAS/Shunts: No atrial level shunt detected by color flow Doppler. Agitated saline contrast was given intravenously to evaluate for intracardiac shunting. Dorris Carnes MD Electronically signed by Dorris Carnes MD Signature Date/Time: 06/13/2019/2:33:14 PM    Final      Assessment/Plan: Pneumonia =  complete course with augmentin 875mg  bid x 7 days-presumably having aspiration pneumonia while unconscious from drug use.  hiv disease = well controlled, continue with biktarvy. Her CD 4 count low during this admission due to acute illness  Abn TTE = TEE did not show any vegetations, nor did she have any blood cx. No need for prolonged abtx  Bipolar/substance use = recommend to follow up with [facility]  Will have an appt for her to follow up with Dr  McFarlan for Infectious Diseases Cell: [phone] Pager: [phone]  06/13/2019, 5:45 PM

## 2019-06-13 NOTE — Anesthesia Preprocedure Evaluation (Signed)
Anesthesia Evaluation  Patient identified by MRN, date of birth, ID band Patient awake    Reviewed: Allergy & Precautions, H&P , NPO status , Patient's Chart, lab work & pertinent test results  Airway Mallampati: II   Neck ROM: full    Dental   Pulmonary    breath sounds clear to auscultation       Cardiovascular negative cardio ROS   Rhythm:regular Rate:Normal     Neuro/Psych    GI/Hepatic   Endo/Other  Hyperthyroidism   Renal/GU      Musculoskeletal   Abdominal   Peds  Hematology   Anesthesia Other Findings   Reproductive/Obstetrics                            Anesthesia Physical Anesthesia Plan  ASA: II  Anesthesia Plan: MAC   Post-op Pain Management:    Induction: Intravenous  PONV Risk Score and Plan: 2 and Propofol infusion and Treatment may vary due to age or medical condition  Airway Management Planned: Nasal Cannula  Additional Equipment:   Intra-op Plan:   Post-operative Plan:   Informed Consent: I have reviewed the patients History and Physical, chart, labs and discussed the procedure including the risks, benefits and alternatives for the proposed anesthesia with the patient or authorized representative who has indicated his/her understanding and acceptance.       Plan Discussed with: CRNA, Anesthesiologist and Surgeon  Anesthesia Plan Comments:         Anesthesia Quick Evaluation

## 2019-06-13 NOTE — Progress Notes (Signed)
Went over discharge instructions w/ patient. Patient verbalized understanding.

## 2019-06-13 NOTE — Discharge Instructions (Signed)
Yolanda Jones,  You were in the hospital because of pneumonia. This has improved with antibiotics. Please follow-up with Dr. Linus Salmons.

## 2019-06-13 NOTE — Anesthesia Postprocedure Evaluation (Signed)
Anesthesia Post Note  Patient: Yolanda Jones  Procedure(s) Performed: TRANSESOPHAGEAL ECHOCARDIOGRAM (TEE) (N/A ) BUBBLE STUDY     Patient location during evaluation: PACU Anesthesia Type: MAC Level of consciousness: awake and alert Pain management: pain level controlled Vital Signs Assessment: post-procedure vital signs reviewed and stable Respiratory status: spontaneous breathing, nonlabored ventilation, respiratory function stable and patient connected to nasal cannula oxygen Cardiovascular status: stable and blood pressure returned to baseline Postop Assessment: no apparent nausea or vomiting Anesthetic complications: no    Last Vitals:  Vitals:   06/13/19 1335 06/13/19 1336  BP:  125/87  Pulse: (!) 106 (!) 51  Resp: (!) 22 (!) 27  Temp:    SpO2: 99% 100%    Last Pain:  Vitals:   06/13/19 1336  TempSrc:   PainSc: 0-No pain                 Effie Berkshire

## 2019-06-14 ENCOUNTER — Other Ambulatory Visit: Payer: Self-pay

## 2019-06-14 ENCOUNTER — Other Ambulatory Visit: Payer: Medicare Other

## 2019-06-14 DIAGNOSIS — B20 Human immunodeficiency virus [HIV] disease: Secondary | ICD-10-CM | POA: Diagnosis not present

## 2019-06-14 LAB — CULTURE, BLOOD (ROUTINE X 2)
Culture: NO GROWTH
Culture: NO GROWTH
Special Requests: ADEQUATE

## 2019-06-15 LAB — T-HELPER CELL (CD4) - (RCID CLINIC ONLY)
CD4 % Helper T Cell: 55 % (ref 33–65)
CD4 T Cell Abs: 750 /uL (ref 400–1790)

## 2019-06-17 LAB — HIV-1 RNA QUANT-NO REFLEX-BLD
HIV 1 RNA Quant: <20 copies/mL
HIV-1 RNA Quant, Log: <1.3 Log copies/mL

## 2019-06-18 DIAGNOSIS — Z23 Encounter for immunization: Secondary | ICD-10-CM | POA: Diagnosis not present

## 2019-06-23 ENCOUNTER — Other Ambulatory Visit: Payer: Self-pay | Admitting: Internal Medicine

## 2019-06-30 ENCOUNTER — Encounter: Payer: Self-pay | Admitting: Internal Medicine

## 2019-06-30 ENCOUNTER — Ambulatory Visit (INDEPENDENT_AMBULATORY_CARE_PROVIDER_SITE_OTHER): Payer: Medicare Other | Admitting: Internal Medicine

## 2019-06-30 ENCOUNTER — Other Ambulatory Visit: Payer: Self-pay

## 2019-06-30 VITALS — BP 112/75 | HR 83 | Wt 152.0 lb

## 2019-06-30 DIAGNOSIS — Z79899 Other long term (current) drug therapy: Secondary | ICD-10-CM | POA: Diagnosis not present

## 2019-06-30 DIAGNOSIS — B009 Herpesviral infection, unspecified: Secondary | ICD-10-CM | POA: Diagnosis not present

## 2019-06-30 DIAGNOSIS — B20 Human immunodeficiency virus [HIV] disease: Secondary | ICD-10-CM

## 2019-06-30 DIAGNOSIS — Z113 Encounter for screening for infections with a predominantly sexual mode of transmission: Secondary | ICD-10-CM

## 2019-06-30 MED ORDER — VALACYCLOVIR HCL 500 MG PO TABS: 500.0000 mg | ORAL_TABLET | Freq: Every day | ORAL | 11 refills | Status: DC

## 2019-06-30 MED ORDER — BIKTARVY 50-200-25 MG PO TABS: 1.0000 | ORAL_TABLET | Freq: Every day | ORAL | 11 refills | Status: DC

## 2019-06-30 NOTE — Progress Notes (Signed)
  Subjective:    Patient ID: Yolanda Jones, female    DOB: 1980/08/30, 39 y.o.   MRN: [id number]  HPI  She comes in for follow up of HIV She continues on Biktarvy and no issues.  No missed doses.  CD4 750, viral load < 20.  Review of Systems  Constitutional: Negative for fatigue.  Gastrointestinal: Negative for diarrhea.  Skin: Negative for rash.  Neurological: Negative for dizziness.       Objective:   Physical Exam  Constitutional: She appears well-developed and well-nourished.  Cardiovascular: Normal rate and regular rhythm.  No murmur heard. Pulmonary/Chest: Effort normal.  Skin: No rash noted.  Psychiatric: Her behavior is normal. Thought content normal.       Assessment & Plan:

## 2019-07-04 ENCOUNTER — Encounter: Payer: Self-pay | Admitting: Internal Medicine

## 2019-07-04 DIAGNOSIS — R140 Abdominal distension (gaseous): Secondary | ICD-10-CM | POA: Diagnosis not present

## 2019-07-04 DIAGNOSIS — K219 Gastro-esophageal reflux disease without esophagitis: Secondary | ICD-10-CM | POA: Diagnosis not present

## 2019-07-04 NOTE — Assessment & Plan Note (Signed)
She continues to do well on her regimen and no changes.   rtc 6 months.

## 2019-07-04 NOTE — Assessment & Plan Note (Signed)
She continues on Valtrex

## 2019-07-21 ENCOUNTER — Other Ambulatory Visit: Payer: Self-pay | Admitting: Internal Medicine

## 2019-09-08 IMAGING — DX DG CHEST 2V
2 series · 2 of 2 positions shown · non-contrast
Comparison: 05/04/2015

CLINICAL DATA: Chest pressure.

EXAM:
CHEST  2 VIEW

[w chest pa]
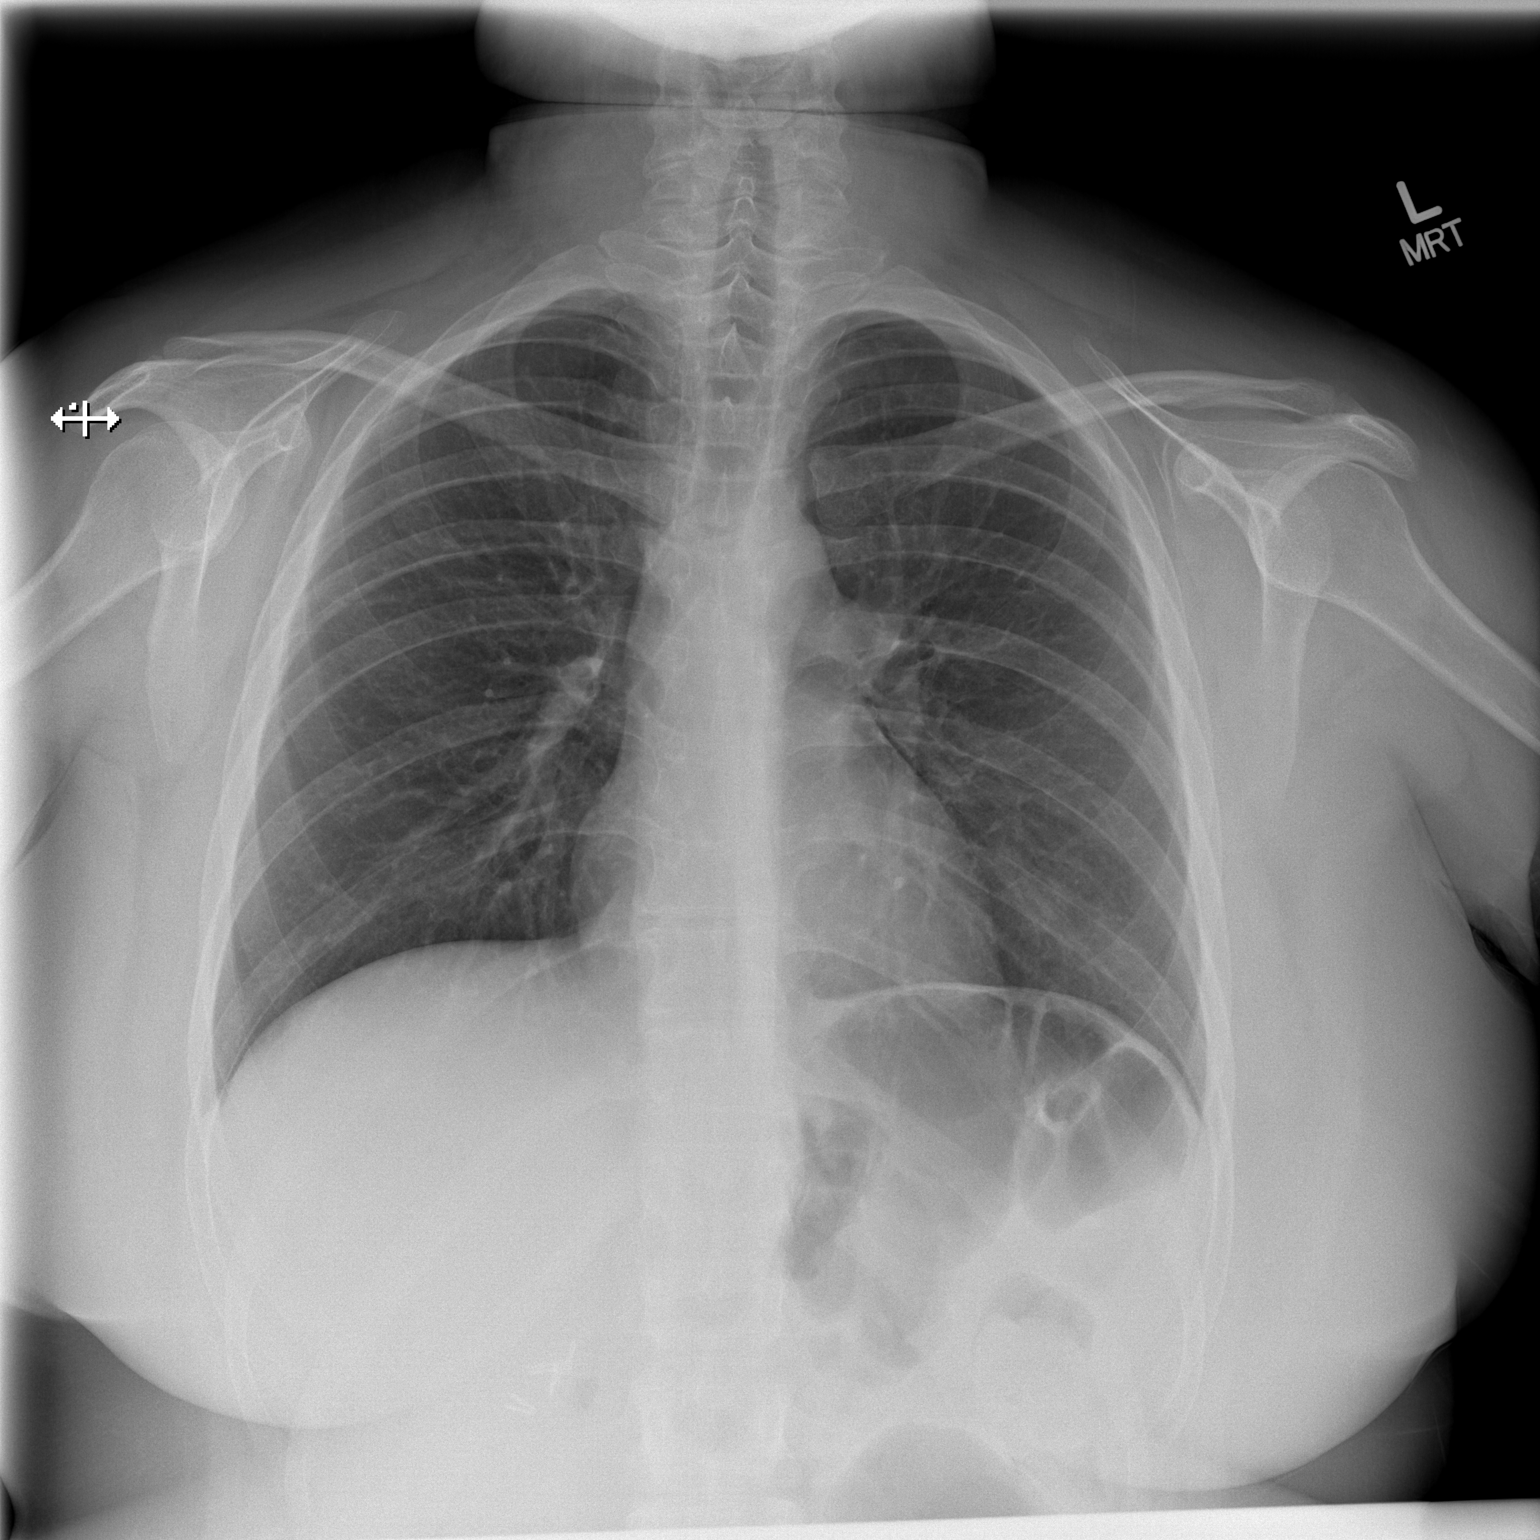

[w chest lat]
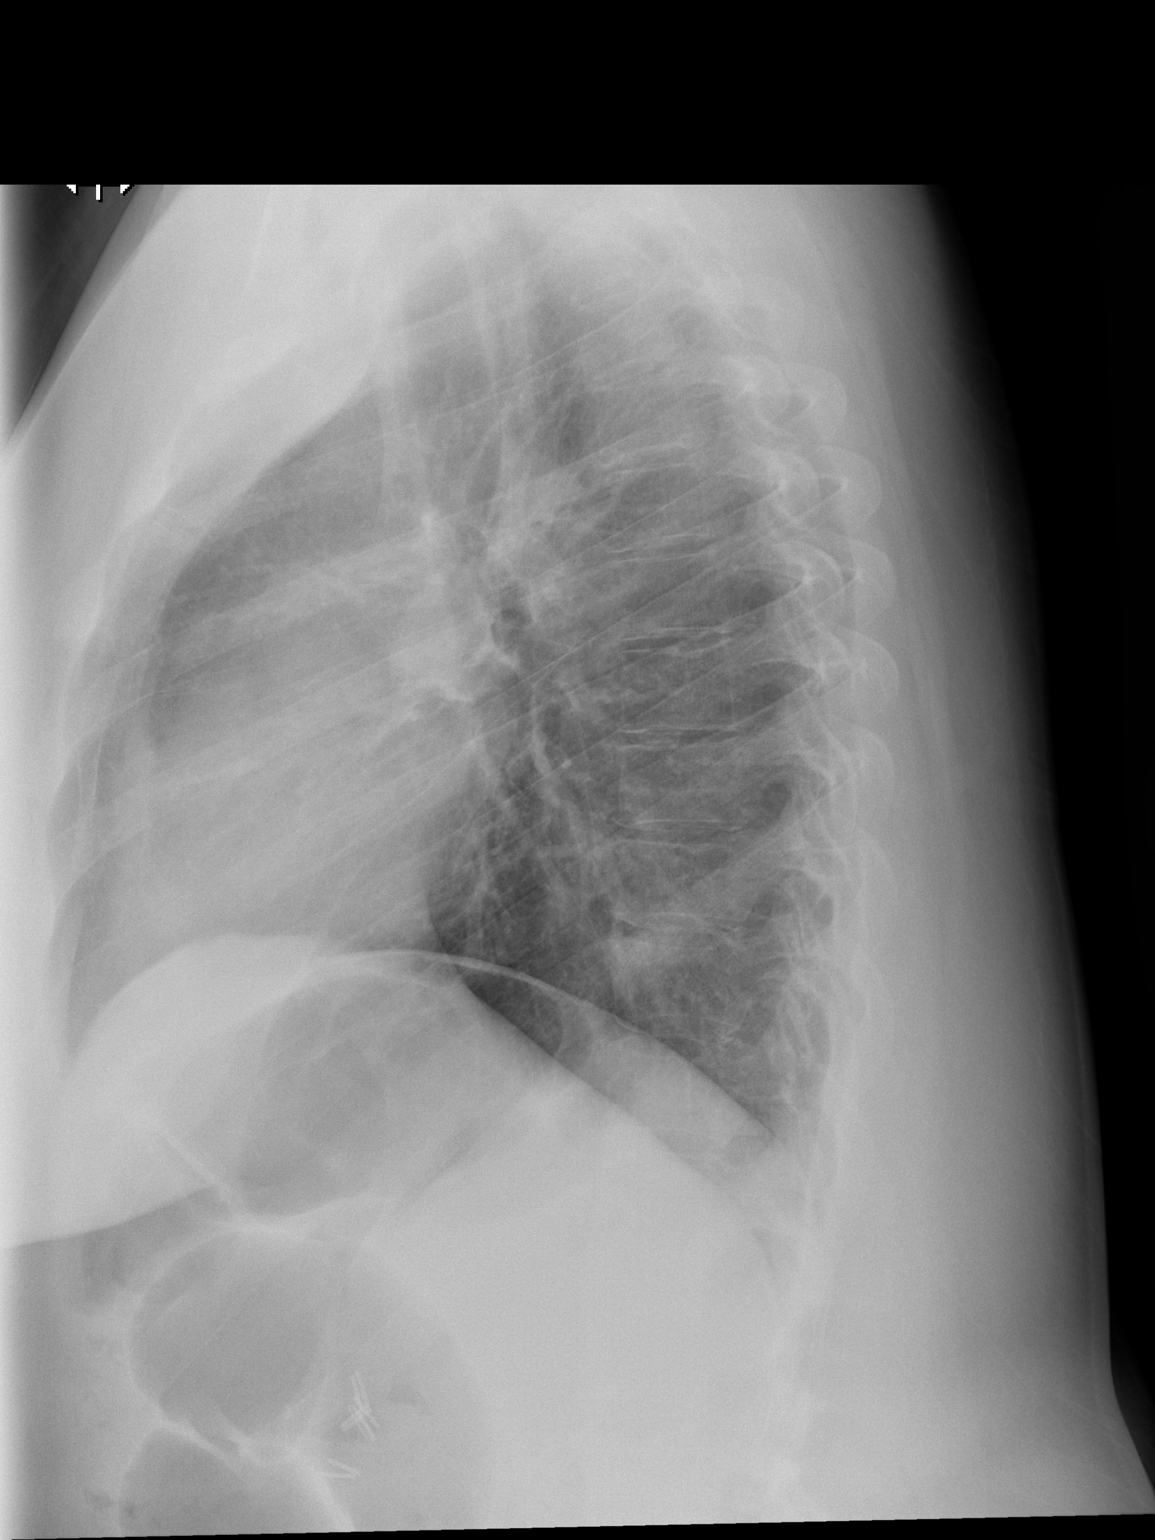

[2 of 2 positions shown; findings below may reference images not displayed]

FINDINGS: Normal heart size and mediastinal contours. There is no edema,
consolidation, effusion, or pneumothorax. Cholecystectomy clips.
IMPRESSION: Negative chest.

## 2019-10-18 ENCOUNTER — Other Ambulatory Visit: Payer: Self-pay | Admitting: Internal Medicine

## 2019-11-06 DIAGNOSIS — Z23 Encounter for immunization: Secondary | ICD-10-CM | POA: Diagnosis not present

## 2019-11-25 DIAGNOSIS — E559 Vitamin D deficiency, unspecified: Secondary | ICD-10-CM | POA: Diagnosis not present

## 2019-11-25 DIAGNOSIS — Z79899 Other long term (current) drug therapy: Secondary | ICD-10-CM | POA: Diagnosis not present

## 2019-12-19 ENCOUNTER — Other Ambulatory Visit: Payer: Medicare Other

## 2019-12-19 ENCOUNTER — Other Ambulatory Visit: Payer: Self-pay

## 2019-12-19 DIAGNOSIS — B20 Human immunodeficiency virus [HIV] disease: Secondary | ICD-10-CM | POA: Diagnosis not present

## 2019-12-19 DIAGNOSIS — K029 Dental caries, unspecified: Secondary | ICD-10-CM | POA: Diagnosis not present

## 2019-12-19 DIAGNOSIS — Z79899 Other long term (current) drug therapy: Secondary | ICD-10-CM | POA: Diagnosis not present

## 2019-12-19 DIAGNOSIS — Z113 Encounter for screening for infections with a predominantly sexual mode of transmission: Secondary | ICD-10-CM | POA: Diagnosis not present

## 2019-12-20 LAB — T-HELPER CELL (CD4) - (RCID CLINIC ONLY)
CD4 % Helper T Cell: 52 % (ref 33–65)
CD4 T Cell Abs: 1059 /uL (ref 400–1790)

## 2019-12-21 LAB — CBC WITH DIFFERENTIAL/PLATELET
Absolute Monocytes: 552 cells/uL (ref 200–950)
Basophils Absolute: 53 cells/uL (ref 0–200)
Basophils Relative: 0.6 %
Eosinophils Absolute: 71 cells/uL (ref 15–500)
Eosinophils Relative: 0.8 %
HCT: 40.9 % (ref 35.0–45.0)
Hemoglobin: 13.9 g/dL (ref 11.7–15.5)
Lymphs Abs: 2029 cells/uL (ref 850–3900)
MCH: 32.4 pg (ref 27.0–33.0)
MCHC: 34 g/dL (ref 32.0–36.0)
MCV: 95.3 fL (ref 80.0–100.0)
MPV: 10.2 fL (ref 7.5–12.5)
Monocytes Relative: 6.2 %
Neutro Abs: 6194 cells/uL (ref 1500–7800)
Neutrophils Relative %: 69.6 %
Platelets: 292 10*3/uL (ref 140–400)
RBC: 4.29 10*6/uL (ref 3.80–5.10)
RDW: 11.9 % (ref 11.0–15.0)
Total Lymphocyte: 22.8 %
WBC: 8.9 10*3/uL (ref 3.8–10.8)

## 2019-12-21 LAB — COMPLETE METABOLIC PANEL WITH GFR
AG Ratio: 1.5 (calc) (ref 1.0–2.5)
ALT: 10 U/L (ref 6–29)
AST: 18 U/L (ref 10–30)
Albumin: 4 g/dL (ref 3.6–5.1)
Alkaline phosphatase (APISO): 43 U/L (ref 31–125)
BUN: 13 mg/dL (ref 7–25)
CO2: 25 mmol/L (ref 20–32)
Calcium: 9.2 mg/dL (ref 8.6–10.2)
Chloride: 104 mmol/L (ref 98–110)
Creat: 1.08 mg/dL (ref 0.50–1.10)
GFR, Est African American: 74 mL/min/{1.73_m2} (ref >=60)
GFR, Est Non African American: 64 mL/min/{1.73_m2} (ref >=60)
Globulin: 2.7 g/dL (calc) (ref 1.9–3.7)
Glucose, Bld: 84 mg/dL (ref 65–99)
Potassium: 3.8 mmol/L (ref 3.5–5.3)
Sodium: 136 mmol/L (ref 135–146)
Total Bilirubin: 0.3 mg/dL (ref 0.2–1.2)
Total Protein: 6.7 g/dL (ref 6.1–8.1)

## 2019-12-21 LAB — LIPID PANEL
Cholesterol: 171 mg/dL (ref <200)
HDL: 50 mg/dL (ref >=50)
LDL Cholesterol (Calc): 97 mg/dL (calc)
Non-HDL Cholesterol (Calc): 121 mg/dL (calc) (ref <130)
Total CHOL/HDL Ratio: 3.4 (calc) (ref <5.0)
Triglycerides: 147 mg/dL (ref <150)

## 2019-12-21 LAB — RPR: RPR Ser Ql: NONREACTIVE

## 2019-12-21 LAB — HIV-1 RNA QUANT-NO REFLEX-BLD
HIV 1 RNA Quant: <20 Copies/mL
HIV-1 RNA Quant, Log: <1.3 Log cps/mL

## 2020-01-02 ENCOUNTER — Other Ambulatory Visit: Payer: Self-pay

## 2020-01-02 ENCOUNTER — Encounter: Payer: Self-pay | Admitting: Internal Medicine

## 2020-01-02 ENCOUNTER — Ambulatory Visit (INDEPENDENT_AMBULATORY_CARE_PROVIDER_SITE_OTHER): Payer: Medicare Other | Admitting: Internal Medicine

## 2020-01-02 VITALS — BP 124/85 | HR 74 | Temp 98.2°F | Wt 150.0 lb

## 2020-01-02 DIAGNOSIS — Z79899 Other long term (current) drug therapy: Secondary | ICD-10-CM

## 2020-01-02 DIAGNOSIS — B009 Herpesviral infection, unspecified: Secondary | ICD-10-CM | POA: Diagnosis not present

## 2020-01-02 DIAGNOSIS — B20 Human immunodeficiency virus [HIV] disease: Secondary | ICD-10-CM | POA: Diagnosis not present

## 2020-01-02 DIAGNOSIS — Z113 Encounter for screening for infections with a predominantly sexual mode of transmission: Secondary | ICD-10-CM | POA: Diagnosis not present

## 2020-01-02 DIAGNOSIS — Z23 Encounter for immunization: Secondary | ICD-10-CM

## 2020-01-02 MED ORDER — VALACYCLOVIR HCL 500 MG PO TABS: 500.0000 mg | ORAL_TABLET | Freq: Every day | ORAL | 11 refills | Status: DC

## 2020-01-02 NOTE — Assessment & Plan Note (Signed)
Will refill her daily, suppressive Valtrex.

## 2020-01-02 NOTE — Progress Notes (Signed)
  Subjective:    Patient ID: Yolanda Jones, female    DOB: [birth year], 39 y.o.   MRN: [id number]  HPI  She comes in for follow up of HIV She continues on Biktarvy and no issues.  No missed doses.  CD4 1059, viral load < 20.  Asking about daily valtrex refills.   Review of Systems  Constitutional: Negative for fatigue.  Gastrointestinal: Negative for diarrhea.  Skin: Negative for rash.  Neurological: Negative for dizziness.       Objective:   Physical Exam Constitutional:      Appearance: She is well-developed.  Cardiovascular:     Rate and Rhythm: Normal rate and regular rhythm.     Heart sounds: No murmur heard.   Pulmonary:     Effort: Pulmonary effort is normal.  Skin:    Findings: No rash.  Psychiatric:        Behavior: Behavior normal.        Thought Content: Thought content normal.   SH: remains drug free     Assessment & Plan:

## 2020-01-02 NOTE — Assessment & Plan Note (Signed)
RPR negative.  Getting screening and PAP smear next month with her OBGYN

## 2020-01-02 NOTE — Assessment & Plan Note (Addendum)
Lipid panel noted, no concerns.

## 2020-01-02 NOTE — Assessment & Plan Note (Signed)
She continues to do well, no concerns.  RTC 6 months

## 2020-01-03 ENCOUNTER — Telehealth: Payer: Self-pay

## 2020-01-03 ENCOUNTER — Ambulatory Visit: Payer: Medicare Other

## 2020-01-03 DIAGNOSIS — B20 Human immunodeficiency virus [HIV] disease: Secondary | ICD-10-CM

## 2020-01-03 NOTE — Progress Notes (Signed)
Patient presents to clinic c/o injection site pain and swelling in left arm from pneumovax vaccine administered yesterday. Patient's left deltoid presents with small pink mark at injection site and very minimal swelling. Patient states she is unable to lift her arm due to the pain and that she has been applying ice and heat and taking ibuprofen.   RN advised patient to continue applying ice and to continue with ibuprofen or tylenol as directed on the bottle as needed. Encouraged patient to move arm frequently. No unusual signs or symptoms present. Patient verbalized understanding and has no further questions.   Beryle Flock, RN

## 2020-01-03 NOTE — Telephone Encounter (Signed)
Patient called stating she received a pneumovax vaccine yesterday during her appointment with Dr. Linus Salmons. Patient states that after her appointment the injection site began to hurt and swell up. Describes the site as looking "fluid-filled" and the size of the palm of her hand. Patient has never had something like this happen after an injection. RN advised patient to come in today for a nurse visit, patient scheduled for 10:15 this morning,   Beryle Flock, RN

## 2020-02-22 DIAGNOSIS — Z124 Encounter for screening for malignant neoplasm of cervix: Secondary | ICD-10-CM | POA: Diagnosis not present

## 2020-02-22 DIAGNOSIS — Z113 Encounter for screening for infections with a predominantly sexual mode of transmission: Secondary | ICD-10-CM | POA: Diagnosis not present

## 2020-02-22 DIAGNOSIS — Z139 Encounter for screening, unspecified: Secondary | ICD-10-CM | POA: Diagnosis not present

## 2020-02-22 DIAGNOSIS — Z01419 Encounter for gynecological examination (general) (routine) without abnormal findings: Secondary | ICD-10-CM | POA: Diagnosis not present

## 2020-02-22 DIAGNOSIS — Z01411 Encounter for gynecological examination (general) (routine) with abnormal findings: Secondary | ICD-10-CM | POA: Diagnosis not present

## 2020-02-22 DIAGNOSIS — Z30431 Encounter for routine checking of intrauterine contraceptive device: Secondary | ICD-10-CM | POA: Diagnosis not present

## 2020-02-22 DIAGNOSIS — Z6829 Body mass index (BMI) 29.0-29.9, adult: Secondary | ICD-10-CM | POA: Diagnosis not present

## 2020-02-22 DIAGNOSIS — Z1151 Encounter for screening for human papillomavirus (HPV): Secondary | ICD-10-CM | POA: Diagnosis not present

## 2020-02-22 DIAGNOSIS — E221 Hyperprolactinemia: Secondary | ICD-10-CM | POA: Diagnosis not present

## 2020-03-06 DIAGNOSIS — Z6828 Body mass index (BMI) 28.0-28.9, adult: Secondary | ICD-10-CM | POA: Diagnosis not present

## 2020-03-06 DIAGNOSIS — B20 Human immunodeficiency virus [HIV] disease: Secondary | ICD-10-CM | POA: Diagnosis not present

## 2020-03-06 DIAGNOSIS — D352 Benign neoplasm of pituitary gland: Secondary | ICD-10-CM | POA: Diagnosis not present

## 2020-03-06 DIAGNOSIS — E229 Hyperfunction of pituitary gland, unspecified: Secondary | ICD-10-CM | POA: Diagnosis not present

## 2020-03-12 DIAGNOSIS — Z09 Encounter for follow-up examination after completed treatment for conditions other than malignant neoplasm: Secondary | ICD-10-CM | POA: Diagnosis not present

## 2020-03-12 DIAGNOSIS — E559 Vitamin D deficiency, unspecified: Secondary | ICD-10-CM | POA: Diagnosis not present

## 2020-03-12 DIAGNOSIS — R875 Abnormal microbiological findings in specimens from female genital organs: Secondary | ICD-10-CM | POA: Diagnosis not present

## 2020-03-12 DIAGNOSIS — R928 Other abnormal and inconclusive findings on diagnostic imaging of breast: Secondary | ICD-10-CM | POA: Diagnosis not present

## 2020-03-20 DIAGNOSIS — N83209 Unspecified ovarian cyst, unspecified side: Secondary | ICD-10-CM | POA: Diagnosis not present

## 2020-03-20 DIAGNOSIS — R52 Pain, unspecified: Secondary | ICD-10-CM | POA: Diagnosis not present

## 2020-03-20 DIAGNOSIS — Z30431 Encounter for routine checking of intrauterine contraceptive device: Secondary | ICD-10-CM | POA: Diagnosis not present

## 2020-03-26 DIAGNOSIS — R102 Pelvic and perineal pain: Secondary | ICD-10-CM | POA: Diagnosis not present

## 2020-03-26 DIAGNOSIS — R52 Pain, unspecified: Secondary | ICD-10-CM | POA: Diagnosis not present

## 2020-03-28 DIAGNOSIS — R102 Pelvic and perineal pain: Secondary | ICD-10-CM | POA: Diagnosis not present

## 2020-03-28 DIAGNOSIS — N83209 Unspecified ovarian cyst, unspecified side: Secondary | ICD-10-CM | POA: Diagnosis not present

## 2020-03-28 DIAGNOSIS — A749 Chlamydial infection, unspecified: Secondary | ICD-10-CM | POA: Diagnosis not present

## 2020-03-28 DIAGNOSIS — T8332XD Displacement of intrauterine contraceptive device, subsequent encounter: Secondary | ICD-10-CM | POA: Diagnosis not present

## 2020-04-07 DIAGNOSIS — Z23 Encounter for immunization: Secondary | ICD-10-CM | POA: Diagnosis not present

## 2020-05-27 ENCOUNTER — Encounter (HOSPITAL_COMMUNITY): Payer: Self-pay | Admitting: Emergency Medicine

## 2020-05-27 ENCOUNTER — Emergency Department (HOSPITAL_COMMUNITY)
Admission: EM | Admit: 2020-05-27 | Discharge: 2020-05-27 | Disposition: A | Discharge status: Home / Self Care | Payer: Medicare HMO | Attending: Emergency Medicine | Admitting: Emergency Medicine

## 2020-05-27 DIAGNOSIS — B20 Human immunodeficiency virus [HIV] disease: Secondary | ICD-10-CM | POA: Diagnosis not present

## 2020-05-27 DIAGNOSIS — M79662 Pain in left lower leg: Secondary | ICD-10-CM | POA: Insufficient documentation

## 2020-05-27 DIAGNOSIS — F17290 Nicotine dependence, other tobacco product, uncomplicated: Secondary | ICD-10-CM | POA: Diagnosis not present

## 2020-05-27 MED ORDER — METHOCARBAMOL 500 MG PO TABS: 500.0000 mg | ORAL_TABLET | Freq: Two times a day (BID) | ORAL | 0 refills | Status: DC
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
## 2020-05-27 NOTE — ED Triage Notes (Signed)
L calf pain when walking x 1 week. Denies redness and swelling.

## 2020-07-10 ENCOUNTER — Other Ambulatory Visit: Payer: Self-pay | Admitting: Internal Medicine

## 2020-07-10 DIAGNOSIS — B20 Human immunodeficiency virus [HIV] disease: Secondary | ICD-10-CM

## 2020-07-17 ENCOUNTER — Other Ambulatory Visit: Payer: Medicare HMO

## 2020-07-17 ENCOUNTER — Other Ambulatory Visit: Payer: Self-pay

## 2020-07-17 DIAGNOSIS — B20 Human immunodeficiency virus [HIV] disease: Secondary | ICD-10-CM

## 2020-07-18 LAB — T-HELPER CELL (CD4) - (RCID CLINIC ONLY)
CD4 % Helper T Cell: 50 % (ref 33–65)
CD4 T Cell Abs: 1050 /uL (ref 400–1790)

## 2020-07-19 LAB — HIV-1 RNA QUANT-NO REFLEX-BLD
HIV 1 RNA Quant: NOT DETECTED Copies/mL
HIV-1 RNA Quant, Log: NOT DETECTED Log cps/mL

## 2020-08-01 ENCOUNTER — Ambulatory Visit (INDEPENDENT_AMBULATORY_CARE_PROVIDER_SITE_OTHER): Payer: Medicare HMO | Admitting: Internal Medicine

## 2020-08-01 ENCOUNTER — Encounter: Payer: Self-pay | Admitting: Internal Medicine

## 2020-08-01 ENCOUNTER — Other Ambulatory Visit: Payer: Self-pay

## 2020-08-01 VITALS — BP 137/89 | HR 88 | Temp 98.2°F | Wt 154.0 lb

## 2020-08-01 DIAGNOSIS — Z113 Encounter for screening for infections with a predominantly sexual mode of transmission: Secondary | ICD-10-CM

## 2020-08-01 DIAGNOSIS — M7989 Other specified soft tissue disorders: Secondary | ICD-10-CM | POA: Insufficient documentation

## 2020-08-01 DIAGNOSIS — Z5181 Encounter for therapeutic drug level monitoring: Secondary | ICD-10-CM

## 2020-08-01 DIAGNOSIS — Z79899 Other long term (current) drug therapy: Secondary | ICD-10-CM | POA: Diagnosis not present

## 2020-08-01 DIAGNOSIS — B20 Human immunodeficiency virus [HIV] disease: Secondary | ICD-10-CM | POA: Diagnosis not present

## 2020-08-01 DIAGNOSIS — M79643 Pain in unspecified hand: Secondary | ICD-10-CM | POA: Insufficient documentation

## 2020-08-01 MED ORDER — BIKTARVY 50-200-25 MG PO TABS: 1.0000 | ORAL_TABLET | Freq: Every day | ORAL | 11 refills | Status: DC

## 2020-08-01 NOTE — Progress Notes (Signed)
   Subjective:    Patient ID: Yolanda Jones, female    DOB: 1980-08-13, 40 y.o.   MRN: [id number]  HPI Here for follow up of HIV She continues on Biktarvy with no missed doses.  No issues with getting, taking or tolerating her medication.  She is having a new issue of bilateral hand swelling, mainly at night. Some pain with the swelling.  Never had this before.  No known history of peripheral neuropathy.  Recently started on Wellbutrin.   CD4 1050 and viral load remains not detected.  Some recent fatigue.     Review of Systems  Constitutional:  Positive for fatigue. Negative for fever and unexpected weight change.  Gastrointestinal:  Negative for diarrhea and nausea.  Skin:  Negative for rash.      Objective:   Physical Exam Eyes:     General: No scleral icterus. Pulmonary:     Effort: Pulmonary effort is normal.  Skin:    Findings: No rash.  Neurological:     Mental Status: She is alert.  Psychiatric:        Thought Content: Thought content normal.   SH: + tobacco       Assessment & Plan:

## 2020-08-01 NOTE — Assessment & Plan Note (Signed)
New problem.  May be some element of peripheral neuropathy.  She will try taking B vitamins and MVI.   She will discuss with her new PCP if it does not improve

## 2020-08-01 NOTE — Assessment & Plan Note (Signed)
She continues to do well with no new concerns.  She will continue on biktarvy with no changes and rtc in 1 year.  Discussed U=U

## 2020-08-26 IMAGING — MR MR HEAD WO/W CM
16 of 19 series · 35 of 48 positions shown · IV contrast (8 ml multihance)
Comparison: MRI brain 09/21/2013.

CLINICAL DATA: Increased prolactin levels.

EXAM:
MRI HEAD WITHOUT AND WITH CONTRAST
TECHNIQUE: Multiplanar, multiecho pulse sequences of the brain and surrounding
structures were obtained without and with intravenous contrast.
CONTRAST:  8mL MULTIHANCE GADOBENATE DIMEGLUMINE 529 MG/ML IV SOLN

[Series 2: t1_se_sag · sagittal · 5.0mm · 0.45mm/px · 2 of 19 slices shown]
[im 1/19]
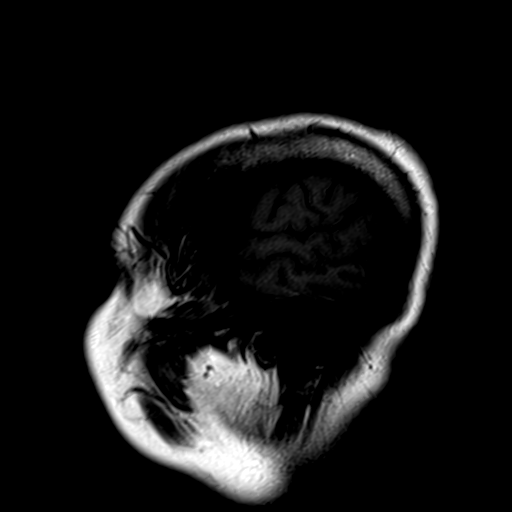
[im 19/19]
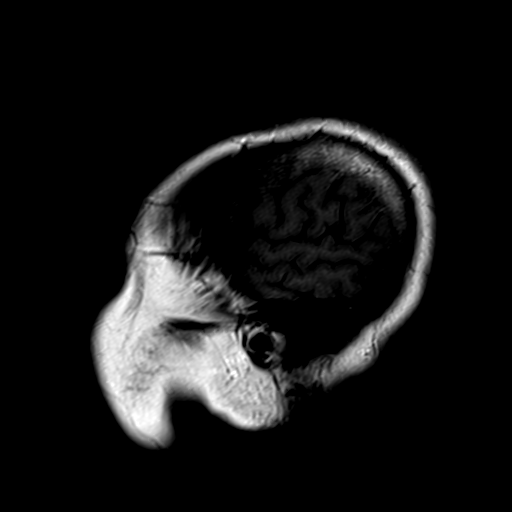

[Series 3: ep2d_diff_(id)_trace · axial · 3.0mm · 1.80mm/px · z∈[-58,+89]mm · 8 of 98 slices shown]
[im 1/98]
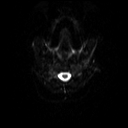
[im 20/98]
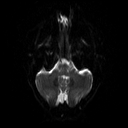
[im 30/98]
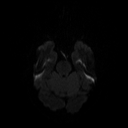
[im 39/98]
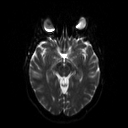
[im 59/98]
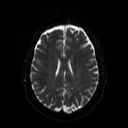
[im 68/98]
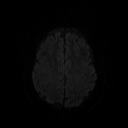
[im 78/98]
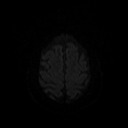
[im 98/98]
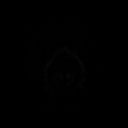

[Series 4: ep2d_diff_(id)_trace_adc · axial · 3.0mm · 1.80mm/px · z∈[-58,+89]mm · 5 of 50 slices shown]
[im 1/50]
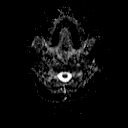
[im 13/50]
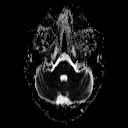
[im 25/50]
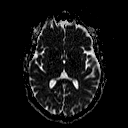
[im 37/50]
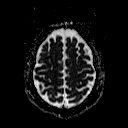
[im 50/50]
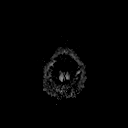

[Series 5: T2 · axial · 5.0mm · 0.45mm/px · z∈[-50,+98]mm · 3 of 24 slices shown]
[im 1/24]
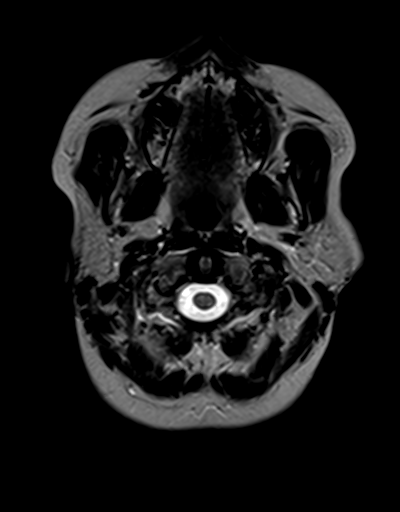
[im 12/24]
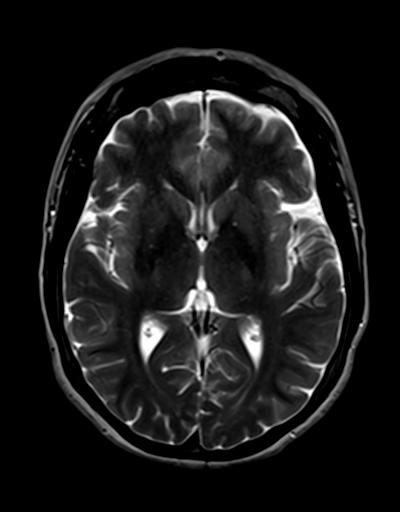
[im 24/24]
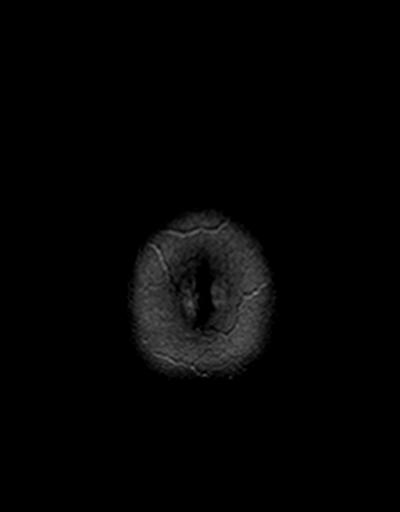

[Series 6: GRE · axial · 5.0mm · 0.45mm/px · z∈[-43,+92]mm · 2 of 22 slices shown]
[im 1/22]
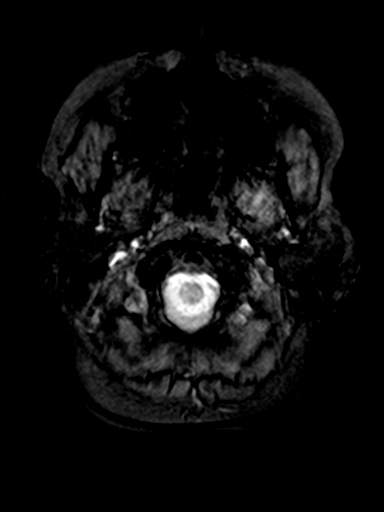
[im 22/22]
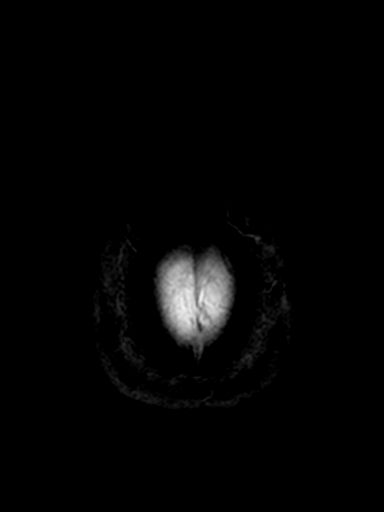

[Series 7: T1 · sagittal · 3.0mm · 0.35mm/px · 1 of 11 slices shown (1 of 2)]
[im 1/11]
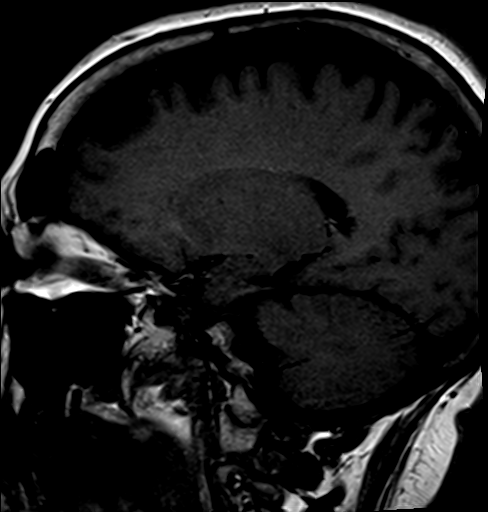

[Series 8: T1 · coronal · 3.0mm · 0.35mm/px · 1 of 13 slices shown (2 of 2)]
[im 1/13]
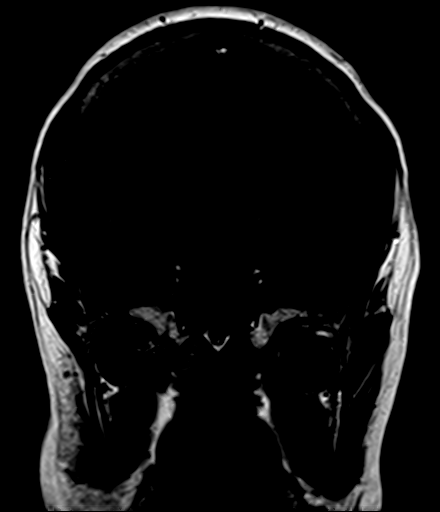

[Series 9: pre cor · coronal · non-contrast · 3.0mm · 0.35mm/px · 1 of 8 slices shown]
[im 1/8]
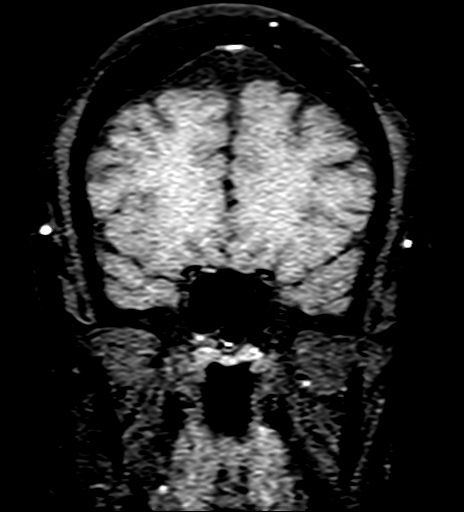

[Series 10: FLAIR · axial · 3.0mm · 0.43mm/px · z∈[-54,+100]mm · 3 of 27 slices shown]
[im 1/27]
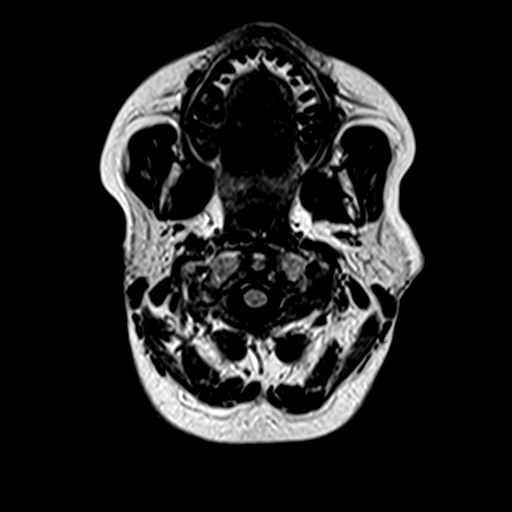
[im 14/27]
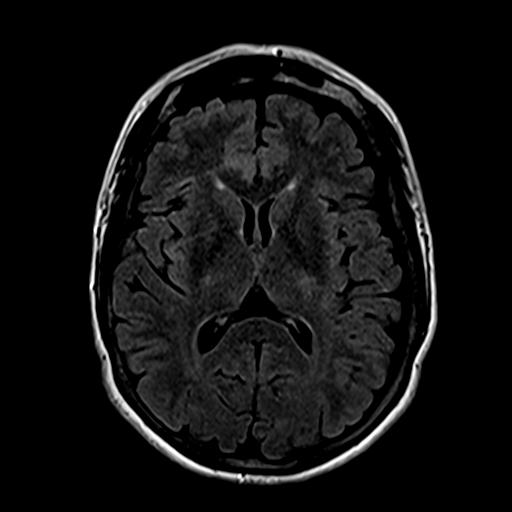
[im 27/27]
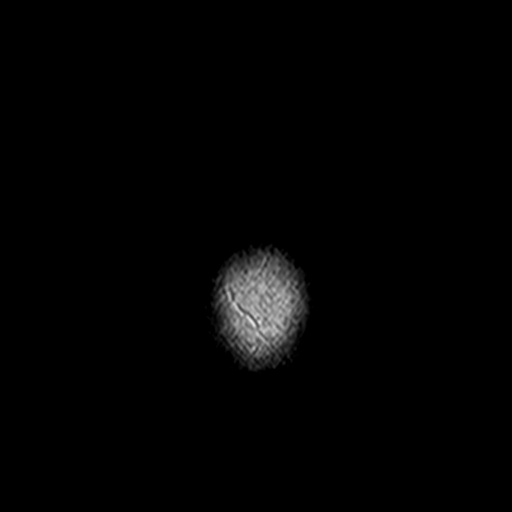

[Series 11: post cor dynamic · coronal · 3.0mm · 0.35mm/px · 1 of 8 slices shown (1 of 4)]
[im 1/8]
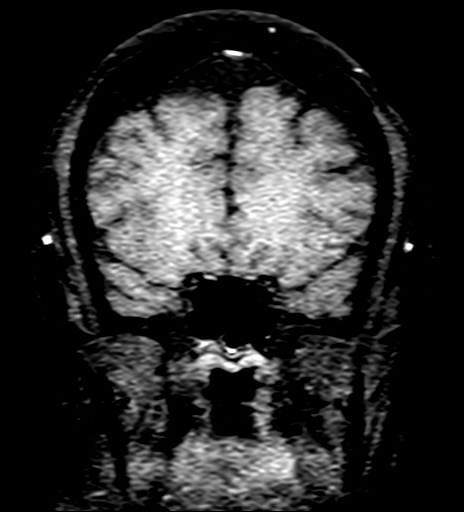

[Series 12: post cor dynamic · coronal · 3.0mm · 0.35mm/px · 1 of 8 slices shown (2 of 4)]
[im 1/8]
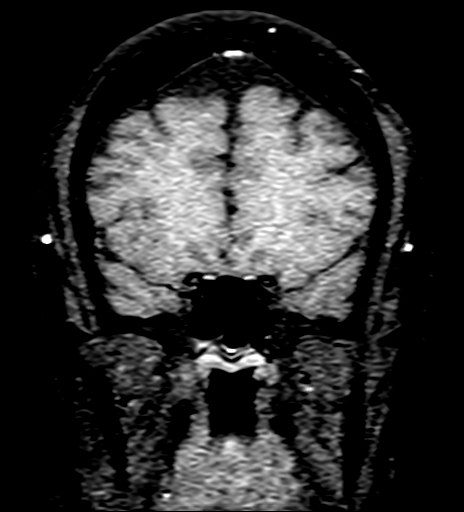

[Series 13: post cor dynamic · coronal · 3.0mm · 0.35mm/px · 1 of 8 slices shown (3 of 4)]
[im 1/8]
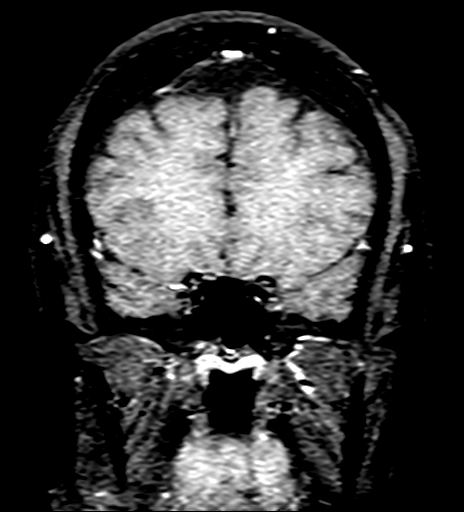

[Series 14: post cor dynamic · coronal · 3.0mm · 0.35mm/px · 1 of 8 slices shown (4 of 4)]
[im 1/8]
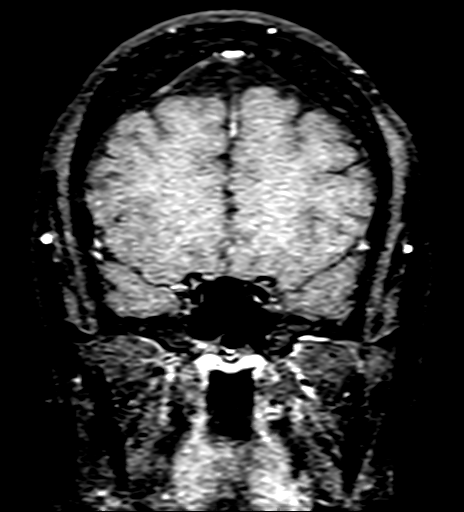

[Series 17: T1 post-contrast · coronal · 3.0mm · 0.35mm/px · 1 of 13 slices shown (1 of 3)]
[im 1/13]
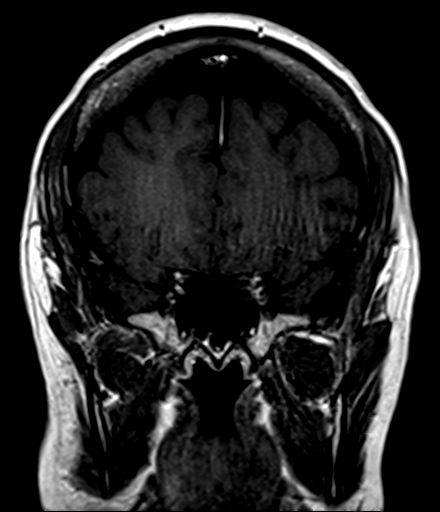

[Series 18: T1 post-contrast · sagittal · 3.0mm · 0.35mm/px · 1 of 11 slices shown (2 of 3)]
[im 1/11]
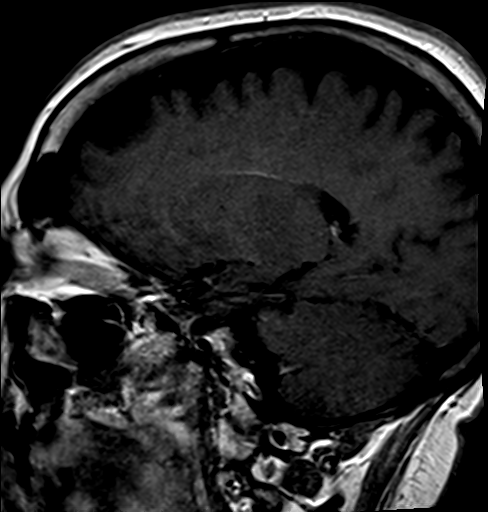

[Series 20: T1 post-contrast · coronal · 5.0mm · 0.69mm/px · 3 of 25 slices shown (3 of 3)]
[im 1/25]
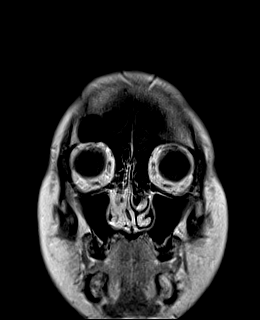
[im 13/25]
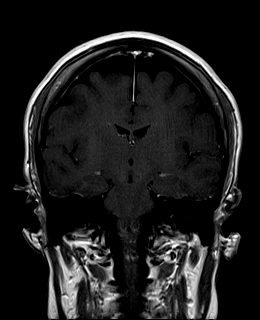
[im 25/25]
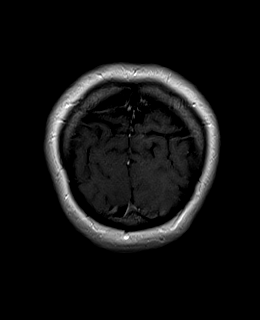

[35 of 48 positions shown; findings below may reference images not displayed]

FINDINGS: Brain: No evidence for acute infarction, hemorrhage, mass lesion,
hydrocephalus, or extra-axial fluid. Premature for age atrophy. No
significant white matter disease. Post infusion, no abnormal
enhancement of the brain or meninges.

Thin-section imaging through the pituitary demonstrates normal gland
height. Stalk midline. No areas of differential enhancement on
dynamic or postcontrast static imaging. Tiny pars intermedia cyst.
Normal cavernous sinuses. Normal optic apparatus.

Vascular: Flow voids are maintained.

Skull and upper cervical spine: Normal marrow signal.

Sinuses/Orbits: Paranasal sinuses are clear. Hypertrophied
turbinates in the RIGHT nasal cavity. Negative orbits.

Other: None.
IMPRESSION: Negative exam. No change from priors. No visible micro- or
macroadenoma.

## 2021-01-17 ENCOUNTER — Other Ambulatory Visit: Payer: Self-pay | Admitting: Internal Medicine

## 2021-06-24 ENCOUNTER — Other Ambulatory Visit: Payer: Self-pay | Admitting: Internal Medicine

## 2021-06-24 DIAGNOSIS — B20 Human immunodeficiency virus [HIV] disease: Secondary | ICD-10-CM

## 2021-08-02 ENCOUNTER — Ambulatory Visit: Payer: Medicare HMO | Admitting: Internal Medicine

## 2021-08-21 ENCOUNTER — Other Ambulatory Visit: Payer: Self-pay | Admitting: Internal Medicine

## 2021-08-21 DIAGNOSIS — B20 Human immunodeficiency virus [HIV] disease: Secondary | ICD-10-CM

## 2021-09-02 ENCOUNTER — Other Ambulatory Visit: Payer: Self-pay

## 2021-09-02 ENCOUNTER — Other Ambulatory Visit: Payer: Medicare HMO

## 2021-09-02 DIAGNOSIS — Z79899 Other long term (current) drug therapy: Secondary | ICD-10-CM

## 2021-09-02 DIAGNOSIS — B20 Human immunodeficiency virus [HIV] disease: Secondary | ICD-10-CM

## 2021-09-02 DIAGNOSIS — Z113 Encounter for screening for infections with a predominantly sexual mode of transmission: Secondary | ICD-10-CM

## 2021-09-03 LAB — T-HELPER CELL (CD4) - (RCID CLINIC ONLY)
CD4 % Helper T Cell: 53 % (ref 33–65)
CD4 T Cell Abs: 684 /uL (ref 400–1790)

## 2021-09-05 LAB — CBC WITH DIFFERENTIAL/PLATELET
Absolute Monocytes: 400 cells/uL (ref 200–950)
Basophils Absolute: 38 cells/uL (ref 0–200)
Basophils Relative: 0.7 %
Eosinophils Absolute: 108 cells/uL (ref 15–500)
Eosinophils Relative: 2 %
HCT: 39.1 % (ref 35.0–45.0)
Hemoglobin: 13.8 g/dL (ref 11.7–15.5)
Lymphs Abs: 1382 cells/uL (ref 850–3900)
MCH: 34.3 pg — ABNORMAL HIGH (ref 27.0–33.0)
MCHC: 35.3 g/dL (ref 32.0–36.0)
MCV: 97.3 fL (ref 80.0–100.0)
MPV: 9.7 fL (ref 7.5–12.5)
Monocytes Relative: 7.4 %
Neutro Abs: 3472 cells/uL (ref 1500–7800)
Neutrophils Relative %: 64.3 %
Platelets: 288 10*3/uL (ref 140–400)
RBC: 4.02 10*6/uL (ref 3.80–5.10)
RDW: 12.7 % (ref 11.0–15.0)
Total Lymphocyte: 25.6 %
WBC: 5.4 10*3/uL (ref 3.8–10.8)

## 2021-09-05 LAB — COMPLETE METABOLIC PANEL WITH GFR
AG Ratio: 1.5 (calc) (ref 1.0–2.5)
ALT: 9 U/L (ref 6–29)
AST: 13 U/L (ref 10–30)
Albumin: 4.1 g/dL (ref 3.6–5.1)
Alkaline phosphatase (APISO): 43 U/L (ref 31–125)
BUN: 13 mg/dL (ref 7–25)
CO2: 22 mmol/L (ref 20–32)
Calcium: 9.3 mg/dL (ref 8.6–10.2)
Chloride: 106 mmol/L (ref 98–110)
Creat: 0.95 mg/dL (ref 0.50–0.99)
Globulin: 2.8 g/dL (calc) (ref 1.9–3.7)
Glucose, Bld: 93 mg/dL (ref 65–99)
Potassium: 4.1 mmol/L (ref 3.5–5.3)
Sodium: 137 mmol/L (ref 135–146)
Total Bilirubin: 0.3 mg/dL (ref 0.2–1.2)
Total Protein: 6.9 g/dL (ref 6.1–8.1)
eGFR: 76 mL/min/{1.73_m2} (ref >=60)

## 2021-09-05 LAB — HIV-1 RNA QUANT-NO REFLEX-BLD
HIV 1 RNA Quant: NOT DETECTED Copies/mL
HIV-1 RNA Quant, Log: NOT DETECTED Log cps/mL

## 2021-09-05 LAB — LIPID PANEL
Cholesterol: 212 mg/dL — ABNORMAL HIGH (ref <200)
HDL: 47 mg/dL — ABNORMAL LOW (ref >=50)
LDL Cholesterol (Calc): 139 mg/dL (calc) — ABNORMAL HIGH
Non-HDL Cholesterol (Calc): 165 mg/dL (calc) — ABNORMAL HIGH (ref <130)
Total CHOL/HDL Ratio: 4.5 (calc) (ref <5.0)
Triglycerides: 140 mg/dL (ref <150)

## 2021-09-05 LAB — RPR: RPR Ser Ql: NONREACTIVE

## 2021-09-13 ENCOUNTER — Ambulatory Visit: Payer: Medicare HMO | Admitting: Internal Medicine

## 2021-09-17 ENCOUNTER — Other Ambulatory Visit: Payer: Self-pay | Admitting: Internal Medicine

## 2021-09-17 DIAGNOSIS — B20 Human immunodeficiency virus [HIV] disease: Secondary | ICD-10-CM

## 2021-10-02 ENCOUNTER — Ambulatory Visit (INDEPENDENT_AMBULATORY_CARE_PROVIDER_SITE_OTHER): Payer: Medicare HMO | Admitting: Internal Medicine

## 2021-10-02 ENCOUNTER — Encounter: Payer: Self-pay | Admitting: Internal Medicine

## 2021-10-02 ENCOUNTER — Other Ambulatory Visit: Payer: Self-pay

## 2021-10-02 VITALS — BP 113/77 | HR 93 | Temp 97.7°F | Ht 60.0 in | Wt 151.0 lb

## 2021-10-02 DIAGNOSIS — B20 Human immunodeficiency virus [HIV] disease: Secondary | ICD-10-CM

## 2021-10-02 DIAGNOSIS — Z79899 Other long term (current) drug therapy: Secondary | ICD-10-CM

## 2021-10-02 DIAGNOSIS — Z113 Encounter for screening for infections with a predominantly sexual mode of transmission: Secondary | ICD-10-CM | POA: Diagnosis not present

## 2021-10-02 MED ORDER — BIKTARVY 50-200-25 MG PO TABS: 1.0000 | ORAL_TABLET | Freq: Every day | ORAL | 11 refills | Status: DC

## 2021-10-02 NOTE — Progress Notes (Signed)
   Subjective:    Patient ID: Yolanda Jones, female    DOB: 09-24-1980, 41 y.o.   MRN: [id number]  HPI Sera is here for her yearly follow up of HIV She continues on [place] and denies any missed doses.  No issues with getting taking or tolerating her medication.  No new concerns.     Review of Systems  Constitutional:  Negative for fatigue.  Gastrointestinal:  Negative for diarrhea.  Skin:  Negative for rash.       Objective:   Physical Exam Eyes:     General: No scleral icterus. Pulmonary:     Effort: Pulmonary effort is normal.  Skin:    Findings: No rash.  Neurological:     Mental Status: She is alert.   SH: no current tobacco        Assessment & Plan:

## 2021-10-02 NOTE — Assessment & Plan Note (Signed)
Lipid panel noted and reviewed.

## 2021-10-02 NOTE — Assessment & Plan Note (Signed)
Will screen today 

## 2021-10-02 NOTE — Assessment & Plan Note (Signed)
She continues to do well with no new concerns.  She will continue with Biktarvy.  Refills sent. Follow up in 1 year.

## 2021-12-02 ENCOUNTER — Emergency Department (HOSPITAL_COMMUNITY)
Admission: EM | Admit: 2021-12-02 | Discharge: 2021-12-02 | Disposition: A | Discharge status: Home / Self Care | Payer: Medicare HMO | Attending: Emergency Medicine | Admitting: Emergency Medicine

## 2021-12-02 ENCOUNTER — Encounter (HOSPITAL_COMMUNITY): Payer: Self-pay

## 2021-12-02 ENCOUNTER — Other Ambulatory Visit: Payer: Self-pay

## 2021-12-02 DIAGNOSIS — Z21 Asymptomatic human immunodeficiency virus [HIV] infection status: Secondary | ICD-10-CM | POA: Diagnosis not present

## 2021-12-02 DIAGNOSIS — R590 Localized enlarged lymph nodes: Secondary | ICD-10-CM | POA: Diagnosis not present

## 2021-12-02 DIAGNOSIS — M542 Cervicalgia: Secondary | ICD-10-CM | POA: Diagnosis not present

## 2021-12-02 DIAGNOSIS — R221 Localized swelling, mass and lump, neck: Secondary | ICD-10-CM | POA: Diagnosis present

## 2021-12-02 DIAGNOSIS — R591 Generalized enlarged lymph nodes: Secondary | ICD-10-CM

## 2021-12-02 LAB — COMPREHENSIVE METABOLIC PANEL
ALT: 14 U/L (ref 0–44)
AST: 20 U/L (ref 15–41)
Albumin: 3.6 g/dL (ref 3.5–5.0)
Alkaline Phosphatase: 58 U/L (ref 38–126)
Anion gap: 6 (ref 5–15)
BUN: 18 mg/dL (ref 6–20)
CO2: 24 mmol/L (ref 22–32)
Calcium: 9.4 mg/dL (ref 8.9–10.3)
Chloride: 106 mmol/L (ref 98–111)
Creatinine, Ser: 1.19 mg/dL — ABNORMAL HIGH (ref 0.44–1.00)
GFR, Estimated: 58 mL/min — ABNORMAL LOW (ref >60)
Glucose, Bld: 89 mg/dL (ref 70–99)
Potassium: 3.8 mmol/L (ref 3.5–5.1)
Sodium: 136 mmol/L (ref 135–145)
Total Bilirubin: 0.3 mg/dL (ref 0.3–1.2)
Total Protein: 6.8 g/dL (ref 6.5–8.1)

## 2021-12-02 LAB — CBC WITH DIFFERENTIAL/PLATELET
Abs Immature Granulocytes: 0.01 10*3/uL (ref 0.00–0.07)
Basophils Absolute: 0.1 10*3/uL (ref 0.0–0.1)
Basophils Relative: 1 %
Eosinophils Absolute: 0.3 10*3/uL (ref 0.0–0.5)
Eosinophils Relative: 4 %
HCT: 41.2 % (ref 36.0–46.0)
Hemoglobin: 14.1 g/dL (ref 12.0–15.0)
Immature Granulocytes: 0 %
Lymphocytes Relative: 33 %
Lymphs Abs: 2.3 10*3/uL (ref 0.7–4.0)
MCH: 33.2 pg (ref 26.0–34.0)
MCHC: 34.2 g/dL (ref 30.0–36.0)
MCV: 96.9 fL (ref 80.0–100.0)
Monocytes Absolute: 0.5 10*3/uL (ref 0.1–1.0)
Monocytes Relative: 7 %
Neutro Abs: 3.8 10*3/uL (ref 1.7–7.7)
Neutrophils Relative %: 55 %
Platelets: 315 10*3/uL (ref 150–400)
RBC: 4.25 MIL/uL (ref 3.87–5.11)
RDW: 11.3 % — ABNORMAL LOW (ref 11.5–15.5)
WBC: 7 10*3/uL (ref 4.0–10.5)
nRBC: 0 % (ref 0.0–0.2)

## 2021-12-02 MED ORDER — CHLORHEXIDINE GLUCONATE 0.12 % MT SOLN: 15.0000 mL | Freq: Two times a day (BID) | OROMUCOSAL | 0 refills | Status: DC

## 2021-12-02 MED ORDER — AMOXICILLIN 500 MG PO TABS: 500.0000 mg | ORAL_TABLET | Freq: Two times a day (BID) | ORAL | 0 refills | Status: AC

## 2021-12-02 NOTE — ED Triage Notes (Signed)
Reports continues to have lumps pop up.  She reports one of left upper arm, left knee and right lower back.  Reports HIV+ and also having dental pain.

## 2021-12-02 NOTE — ED Provider Notes (Signed)
[facility] EMERGENCY DEPARTMENT Provider Note   CSN: [id number] Arrival date & time: 12/02/21  2440     History  Chief Complaint  Patient presents with   Mass    Yolanda Jones is a 41 y.o. female  living with a history of HIV on Biktarvy, bipolar disorder, anxiety, and cervical radiculopathy presenting to the [facility] with new painfull mass on her L neck  Patient reports that on of her front teeth fell out about 1.5 weeks ago, with some remnants left in gum. Since, her gums and jaw have been painful. She noticed an enlarging lump on the L side of her neck that is tender when she touches it. She denies fevers, chills, purulence in her gums, or drainage from this neck mass. She reports no headaches or difficulty breathing or eating.  Patient also denies night sweats, night sweats, weight loss, loss of appetite, sob, or abdominal pain.  The history is provided by the patient. No language interpreter was used.     Home Medications Prior to Admission medications   Medication Sig Start Date End Date Taking? Authorizing Provider  amoxicillin (AMOXIL) 500 MG tablet Take 1 tablet (500 mg total) by mouth 2 (two) times daily for 7 days. For tooth infection 12/02/21 12/09/21 Yes Romana Juniper, MD  chlorhexidine (PERIDEX) 0.12 % solution Use as directed 15 mLs in the mouth or throat 2 (two) times daily. 12/02/21  Yes Romana Juniper, MD  ALPRAZolam Duanne Moron) 1 MG tablet Take 1 mg by mouth daily as needed. 01/14/19   [provider]  benztropine (COGENTIN) 2 MG tablet Take 2 mg by mouth daily.    [provider]  bictegravir-emtricitabine-tenofovir AF (BIKTARVY) 50-200-25 MG TABS tablet Take 1 tablet by mouth daily. 10/02/21   Thayer Headings, MD  buPROPion (WELLBUTRIN XL) 150 MG 24 hr tablet Take 1 tablet by mouth every morning. 07/25/20   [provider]  Cariprazine HCl (VRAYLAR) 4.5 MG CAPS Take 1 capsule by mouth at bedtime.      [provider]  dexmethylphenidate (FOCALIN XR) 20 MG 24 hr capsule Take 40 mg by mouth daily.    [provider]  dexmethylphenidate (FOCALIN) 10 MG tablet Take 20 mg by mouth daily. 03/26/20   [provider]  levonorgestrel (MIRENA) 20 MCG/24HR IUD 1 each by Intrauterine route once.    [provider]  naproxen sodium (ALEVE) 220 MG tablet Take 220 mg by mouth daily as needed (pain).    [provider]  sertraline (ZOLOFT) 100 MG tablet Take 150 mg by mouth daily.     [provider]  valACYclovir (VALTREX) 500 MG tablet TAKE 1 TABLET(500 MG) BY MOUTH DAILY 01/17/21   Comer, Okey Regal, MD      Allergies    Gabapentin, Naproxen, Atomoxetine hcl, Baclofen, Clindamycin/lincomycin, Meloxicam, Oxaprozin, Prednisone, Strattera [atomoxetine], and Tramadol    Review of Systems   Review of Systems  Constitutional:  Negative for appetite change, chills, diaphoresis, fatigue and fever.  HENT:  Positive for dental problem. Negative for congestion, ear discharge, ear pain, facial swelling, mouth sores, sinus pain and sore throat.   Eyes: Negative.   Respiratory: Negative.    Cardiovascular: Negative.   Gastrointestinal: Negative.   Skin: Negative.   Hematological:  Positive for adenopathy.  Psychiatric/Behavioral: Negative.      Physical Exam Updated Vital Signs BP (!) 110/94 (BP Location: Right Arm)   Pulse 84   Temp 97.7 F (36.5 C) (Oral)  Resp 14   Ht 5' (1.524 m)   Wt 68.5 kg   SpO2 100%   BMI 29.49 kg/m  Physical Exam Constitutional:      Appearance: Normal appearance.  HENT:     Head: Normocephalic and atraumatic.     Mouth/Throat:     Mouth: Mucous membranes are moist.     Pharynx: Posterior oropharyngeal erythema present.     Comments: Patient with several missing molars. Missing front incisor with root of tooth visible on exam. Mild eythema without purulence noted on gums. No drainage. No mouth sores. Eyes:      Extraocular Movements: Extraocular movements intact.     Pupils: Pupils are equal, round, and reactive to light.  Neck:     Vascular: No carotid bruit.     Comments: ~2cm anterior cervical LAD with tenderness to mild palpation. No overlaying erythema or sinus tracts Cardiovascular:     Rate and Rhythm: Normal rate and regular rhythm.     Pulses: Normal pulses.  Pulmonary:     Effort: Pulmonary effort is normal.     Breath sounds: Normal breath sounds.  Abdominal:     General: Bowel sounds are normal.     Palpations: Abdomen is soft.  Musculoskeletal:        General: Normal range of motion.     Cervical back: Normal range of motion and neck supple. Tenderness present. No rigidity.  Lymphadenopathy:     Cervical: Cervical adenopathy present.  Skin:    General: Skin is warm and dry.  Neurological:     General: No focal deficit present.     Mental Status: She is alert and oriented to person, place, and time.  Psychiatric:        Mood and Affect: Mood normal.        Behavior: Behavior normal.        Thought Content: Thought content normal.        Judgment: Judgment normal.     ED Results / Procedures / Treatments   Labs (all labs ordered are listed, but only abnormal results are displayed) Labs Reviewed  CBC WITH DIFFERENTIAL/PLATELET - Abnormal; Notable for the following components:      Result Value   RDW 11.3 (*)    All other components within normal limits  COMPREHENSIVE METABOLIC PANEL - Abnormal; Notable for the following components:   Creatinine, Ser 1.19 (*)    GFR, Estimated 58 (*)    All other components within normal limits   09/02/2021 HIV RNA quant undetectable. CD4 cells 684.  EKG None  Radiology No results found.  Procedures Procedures - Non indicated today  Medications Ordered in ED Medications - No data to display   ED Course/ Medical Decision Making/ A&P                           Medical Decision Making Amount and/or Complexity of Data  Reviewed External Data Reviewed: labs. Labs: ordered.    Details: Normal BMP and CBC Discussion of management or test interpretation with external provider(s): 41yo F patient living with hx of HIV+ on [place] presenting to the ED with new LAD. Patient with stable vital signs, no symptoms of systemic infection or B symptoms, and a 2 week course of dental pain. Patient is followed by Dr. Linus Salmons with recent undetectable viral load and CD4 count >600. Ddx dental infection vs malignancy given comorbidity. On exam oropharyx with erythema and tooth decay in  multiple incisors and molars, concerning for dental infection. + L anterior cervical LAD likely in the setting of drainage. CBC and BMP wnl during this visit. Will plan to discharge patient with 7-day course of Amoxicillin, mouthwash, and instructions to be followed by dentist for possible tooth extraction.  Spoke with patient and return precautions were discussed. Patient in agreement with plan.  Risk Prescription drug management.   Final Clinical Impression(s) / ED Diagnoses Final diagnoses:  Lymphadenopathy    Rx / DC Orders ED Discharge Orders          Ordered    chlorhexidine (PERIDEX) 0.12 % solution  2 times daily        12/02/21 1051    amoxicillin (AMOXIL) 500 MG tablet  2 times daily        12/02/21 1051              Romana Juniper, MD 12/02/21 1118    Isla Pence, MD 12/02/21 1351

## 2021-12-02 NOTE — Discharge Instructions (Addendum)
You were seen in the ED for a new neck mass associated with teeth and jaw pain. We believe this is likely because you have a tooth infection. It is important that you finish the antibiotic course, use the mouth wash, and we are prescribing you and follow up with a dentist to ensure the tooth remnants are removed from your gum.  Please take care of yourself,  Romana Juniper, MD

## 2021-12-30 ENCOUNTER — Telehealth: Payer: Self-pay

## 2021-12-30 NOTE — Telephone Encounter (Signed)
Patient called office today stating she has some concerns for nodules that are on her neck and all over body. Neck module appeared last month. States that it is painless, but is concerned about it not going away. Has other nodules on her body. States that some hurt and others dont. Did see PCP regarding this and felt like it could be fat nodules.  Would like to know what Dr. Linus Salmons suggest she do or if he has any ideas what it could be.  Advised patient to follow up with PCP, but also go to ED/ urgent care if nodules become more concerning to her. Leatrice Jewels, RMA

## 2021-12-31 NOTE — Telephone Encounter (Signed)
Called patient back and reiterated to either follow up with PCP/ urgent care if nodules. Leatrice Jewels, RMA

## 2022-06-11 ENCOUNTER — Other Ambulatory Visit: Payer: Self-pay

## 2022-06-11 DIAGNOSIS — B20 Human immunodeficiency virus [HIV] disease: Secondary | ICD-10-CM

## 2022-06-11 MED ORDER — BIKTARVY 50-200-25 MG PO TABS: 1.0000 | ORAL_TABLET | Freq: Every day | ORAL | 2 refills | Status: DC

## 2022-06-25 ENCOUNTER — Other Ambulatory Visit: Payer: Self-pay | Admitting: Internal Medicine

## 2022-06-25 NOTE — Telephone Encounter (Signed)
Ok to refill 

## 2022-07-24 NOTE — Progress Notes (Signed)
The 10-year ASCVD risk score (Arnett DK, et al., 2019) is: 0.7%   Values used to calculate the score:     Age: 42 years     Sex: Female     Is Non-Hispanic African American: No     Diabetic: No     Tobacco smoker: No     Systolic Blood Pressure: 105 mmHg     Is BP treated: No     HDL Cholesterol: 47 mg/dL     Total Cholesterol: 212 mg/dL  Sandie Ano, RN

## 2022-07-30 ENCOUNTER — Other Ambulatory Visit: Payer: Self-pay

## 2022-07-30 DIAGNOSIS — B009 Herpesviral infection, unspecified: Secondary | ICD-10-CM

## 2022-07-30 MED ORDER — VALACYCLOVIR HCL 500 MG PO TABS: ORAL_TABLET | ORAL | 6 refills | Status: DC

## 2022-09-09 ENCOUNTER — Other Ambulatory Visit (HOSPITAL_COMMUNITY)
Admission: RE | Admit: 2022-09-09 | Discharge: 2022-09-09 | Disposition: A | Discharge status: Home / Self Care | Payer: Medicare HMO | Source: Ambulatory Visit | Attending: Internal Medicine | Admitting: Internal Medicine

## 2022-09-09 ENCOUNTER — Other Ambulatory Visit: Payer: Self-pay

## 2022-09-09 ENCOUNTER — Other Ambulatory Visit: Payer: Medicare HMO

## 2022-09-09 DIAGNOSIS — Z113 Encounter for screening for infections with a predominantly sexual mode of transmission: Secondary | ICD-10-CM | POA: Diagnosis present

## 2022-09-09 DIAGNOSIS — B20 Human immunodeficiency virus [HIV] disease: Secondary | ICD-10-CM | POA: Insufficient documentation

## 2022-09-09 DIAGNOSIS — Z79899 Other long term (current) drug therapy: Secondary | ICD-10-CM

## 2022-09-20 ENCOUNTER — Other Ambulatory Visit: Payer: Self-pay | Admitting: Internal Medicine

## 2022-09-20 DIAGNOSIS — B20 Human immunodeficiency virus [HIV] disease: Secondary | ICD-10-CM

## 2022-09-23 ENCOUNTER — Ambulatory Visit: Payer: Medicare HMO | Admitting: Internal Medicine

## 2022-09-25 ENCOUNTER — Encounter: Payer: Self-pay | Admitting: Internal Medicine

## 2022-09-25 ENCOUNTER — Ambulatory Visit (INDEPENDENT_AMBULATORY_CARE_PROVIDER_SITE_OTHER): Payer: Medicare HMO | Admitting: Internal Medicine

## 2022-09-25 ENCOUNTER — Other Ambulatory Visit: Payer: Self-pay

## 2022-09-25 VITALS — BP 107/79 | HR 67 | Temp 97.6°F | Wt 174.8 lb

## 2022-09-25 DIAGNOSIS — N182 Chronic kidney disease, stage 2 (mild): Secondary | ICD-10-CM

## 2022-09-25 DIAGNOSIS — R87612 Low grade squamous intraepithelial lesion on cytologic smear of cervix (LGSIL): Secondary | ICD-10-CM

## 2022-09-25 DIAGNOSIS — Z5181 Encounter for therapeutic drug level monitoring: Secondary | ICD-10-CM

## 2022-09-25 DIAGNOSIS — B20 Human immunodeficiency virus [HIV] disease: Secondary | ICD-10-CM | POA: Diagnosis not present

## 2022-09-25 DIAGNOSIS — Z79899 Other long term (current) drug therapy: Secondary | ICD-10-CM

## 2022-09-25 DIAGNOSIS — Z113 Encounter for screening for infections with a predominantly sexual mode of transmission: Secondary | ICD-10-CM

## 2022-09-25 MED ORDER — BIKTARVY 50-200-25 MG PO TABS: 1.0000 | ORAL_TABLET | Freq: Every day | ORAL | 11 refills | Status: DC

## 2022-09-25 NOTE — Assessment & Plan Note (Signed)
Remains to have a mild bump.  Will monitor yearly.

## 2022-09-25 NOTE — Progress Notes (Signed)
   Subjective:    Patient ID: Yolanda Jones, female    DOB: [birth year], 42 y.o.   MRN: [id number]  HPI Yolanda Jones is here for her yearly follow up of HIV She continues on [place], no issues with getting or taking her medication.   No complaints.     Review of Systems  Constitutional:  Negative for fatigue and fever.  Gastrointestinal:  Negative for diarrhea and nausea.  Skin:  Negative for rash.       Objective:   Physical Exam Eyes:     General: No scleral icterus. Pulmonary:     Effort: Pulmonary effort is normal.  Skin:    Findings: No rash.  Neurological:     Mental Status: She is alert.   SH: no tobacco        Assessment & Plan:

## 2022-09-25 NOTE — Assessment & Plan Note (Signed)
Screened negative 

## 2022-09-25 NOTE — Assessment & Plan Note (Signed)
Pap monitored by PCP

## 2022-09-25 NOTE — Assessment & Plan Note (Addendum)
She is doing well on Biktarvy with no missed doses or concerns.  No changes and labs reviewed with her.   Follow up in 1 year.  Can consider statin next year.

## 2022-09-25 NOTE — Assessment & Plan Note (Signed)
LFTs wnl

## 2023-04-15 ENCOUNTER — Ambulatory Visit (HOSPITAL_COMMUNITY)
Admission: EM | Admit: 2023-04-15 | Discharge: 2023-04-15 | Disposition: A | Discharge status: Home / Self Care | Attending: Psychiatry | Admitting: Psychiatry

## 2023-04-15 DIAGNOSIS — F1410 Cocaine abuse, uncomplicated: Secondary | ICD-10-CM | POA: Insufficient documentation

## 2023-04-15 NOTE — ED Notes (Signed)
 Patient discharged by provider.

## 2023-04-15 NOTE — ED Provider Notes (Signed)
 Behavioral Health Urgent Care Medical Screening Exam  Patient Name: Yolanda Jones MRN: [id number] Date of Evaluation: 04/15/23 Chief Complaint:   Diagnosis:  Final diagnoses:  None    History of Present illness: Yolanda Jones is a 43 y.o. female with hx of chronic cocaine use. Guest presents voluntarily seeking help with her substance use disorder. She reports, "I need help. I have been abusing crack cocaine for 7 days.  I'm looking for therapist to help me out & point me to the right direction. The last time I smoked crack was 2 days ago. I was diagnosed with bipolar & anxiety disorders. I'm on a lot of medicines for my mental health, but I cannot remember the names of those medicines. My mental health is under control, but my crack cocaine use is not. I was sober for a long time, due bad relationship break-up, I relapsed. Once I relapsed few years ago, I could not stop using. I'm not feeling suicidal or homicidal ideations. I do not hear voices or see things. I have not attempted suicide, but my nephew shot himself & died. My sister abuses cocaine.  Flowsheet Row ED from 04/15/2023 in [facility] ED from 12/02/2021 in [facility] Emergency Department at [facility]  C-SSRS RISK CATEGORY No Risk No Risk      Physical Exam: Physical Exam Vitals and nursing note reviewed.  Cardiovascular:     Rate and Rhythm: Normal rate.     Pulses: Normal pulses.  Pulmonary:     Effort: Pulmonary effort is normal.  Musculoskeletal:        General: Normal range of motion.     Cervical back: Normal range of motion.  Skin:    General: Skin is warm.  Neurological:     General: No focal deficit present.     Mental Status: She is oriented to person, place, and time. Mental status is at baseline.    Review of Systems  Constitutional:  Negative for chills, diaphoresis and fever.  HENT:  Negative for congestion and sore throat.   Respiratory:  Negative for  cough, shortness of breath and wheezing.   Cardiovascular:  Negative for chest pain and palpitations.  Gastrointestinal:  Negative for abdominal pain, constipation, diarrhea, heartburn, nausea and vomiting.  Genitourinary:  Negative for dysuria.  Musculoskeletal:  Negative for joint pain and myalgias.  Neurological:  Negative for dizziness, tingling, tremors, sensory change, speech change, focal weakness, seizures, loss of consciousness, weakness and headaches.  Psychiatric/Behavioral:  Positive for substance abuse. Negative for depression, hallucinations, memory loss and suicidal ideas (Cocaine addiction.). The patient is not nervous/anxious and does not have insomnia.    Blood pressure (!) 109/48, pulse 63, resp. rate 20, SpO2 100%. There is no height or weight on file to calculate BMI.  Musculoskeletal: Strength & Muscle Tone: within normal limits Gait & Station: normal Patient leans: N/A   [facility] MSE Discharge Disposition for Follow up and Recommendations: Based on my evaluation the patient does not appear to have an emergency medical condition and can be discharged with resources and follow up care in outpatient services for Substance Abuse Intensive Outpatient Program  Armandina Stammer, NP, pmhnp, fnp-bc. 04/15/2023, 1:18 PM

## 2023-04-15 NOTE — Progress Notes (Signed)
   04/15/23 0916  [facility] Triage Screening (Walk-ins at [facility] only)  What Is the Reason for Your Visit/Call Today? Yolanda Jones is a 43 year old female presenting to [facility] unaccompanied. Pt reports she is addicted to cocaine. Pt reports she has been using cocaine for 7 years. Pt also mentions she was clean for several years, but started using again recently. Pt last used cocaine on Monday and recalls it was $300 worth. Pt has been hospitalized before for substance use treaatment before at [facility]. Pt also mentions she takes medication as prescribed and is also looking for a therapist at this time. Pt mentions she had a thought to self harm this morning, by scratching herself. However, pt reports to have no plan to end her life. Pt denies substance use in the last 24 hours, Hi and Avh.  How Long Has This Been Causing You Problems? 1 wk - 1 month  Have You Recently Had Any Thoughts About Hurting Yourself? Yes  How long ago did you have thoughts about hurting yourself? today  Are You Planning to Commit Suicide/Harm Yourself At This time? No  Have you Recently Had Thoughts About Hurting Someone Karolee Ohs? No  Are You Planning To Harm Someone At This Time? No  Physical Abuse Denies  Verbal Abuse Denies  Sexual Abuse Denies  Exploitation of patient/patient's resources Denies  Self-Neglect Denies  Possible abuse reported to: Other (Comment)  Are you currently experiencing any auditory, visual or other hallucinations? No  Have You Used Any Alcohol or Drugs in the Past 24 Hours? No  Do you have any current medical co-morbidities that require immediate attention? No  Clinician description of patient physical appearance/behavior: anxious, frantic, paranoid  What Do You Feel Would Help You the Most Today? Medication(s);Alcohol or Drug Use Treatment  If access to [facility] Urgent [facility] was not available, would you have sought care in the Emergency Department? No  Determination of Need Urgent (48 hours)  Options For Referral  Facility-Based Crisis  Determination of Need filed? Yes

## 2023-04-19 NOTE — Discharge Instructions (Signed)
 Patient is referred to the CDIOP.

## 2023-04-21 ENCOUNTER — Other Ambulatory Visit: Payer: Self-pay

## 2023-04-21 ENCOUNTER — Encounter (HOSPITAL_BASED_OUTPATIENT_CLINIC_OR_DEPARTMENT_OTHER): Payer: Self-pay | Admitting: Urology

## 2023-04-21 ENCOUNTER — Emergency Department (HOSPITAL_BASED_OUTPATIENT_CLINIC_OR_DEPARTMENT_OTHER)
Admission: EM | Admit: 2023-04-21 | Discharge: 2023-04-21 | Discharge status: Against Medical Advice | Attending: Emergency Medicine | Admitting: Emergency Medicine

## 2023-04-21 DIAGNOSIS — K5900 Constipation, unspecified: Secondary | ICD-10-CM | POA: Diagnosis not present

## 2023-04-21 DIAGNOSIS — Z5321 Procedure and treatment not carried out due to patient leaving prior to being seen by health care provider: Secondary | ICD-10-CM | POA: Insufficient documentation

## 2023-04-21 DIAGNOSIS — R112 Nausea with vomiting, unspecified: Secondary | ICD-10-CM | POA: Diagnosis present

## 2023-04-21 DIAGNOSIS — R519 Headache, unspecified: Secondary | ICD-10-CM | POA: Diagnosis not present

## 2023-04-21 DIAGNOSIS — R1010 Upper abdominal pain, unspecified: Secondary | ICD-10-CM | POA: Insufficient documentation

## 2023-04-21 LAB — COMPREHENSIVE METABOLIC PANEL
ALT: 13 U/L (ref 0–44)
AST: 18 U/L (ref 15–41)
Albumin: 3.5 g/dL (ref 3.5–5.0)
Alkaline Phosphatase: 50 U/L (ref 38–126)
Anion gap: 10 (ref 5–15)
BUN: 8 mg/dL (ref 6–20)
CO2: 24 mmol/L (ref 22–32)
Calcium: 8.6 mg/dL — ABNORMAL LOW (ref 8.9–10.3)
Chloride: 100 mmol/L (ref 98–111)
Creatinine, Ser: 1.04 mg/dL — ABNORMAL HIGH (ref 0.44–1.00)
GFR, Estimated: >60 mL/min (ref >60)
Glucose, Bld: 87 mg/dL (ref 70–99)
Potassium: 3.4 mmol/L — ABNORMAL LOW (ref 3.5–5.1)
Sodium: 134 mmol/L — ABNORMAL LOW (ref 135–145)
Total Bilirubin: 0.5 mg/dL (ref 0.0–1.2)
Total Protein: 6.6 g/dL (ref 6.5–8.1)

## 2023-04-21 LAB — CBC
HCT: 36.5 % (ref 36.0–46.0)
Hemoglobin: 12.3 g/dL (ref 12.0–15.0)
MCH: 32.4 pg (ref 26.0–34.0)
MCHC: 33.7 g/dL (ref 30.0–36.0)
MCV: 96.1 fL (ref 80.0–100.0)
Platelets: 308 10*3/uL (ref 150–400)
RBC: 3.8 MIL/uL — ABNORMAL LOW (ref 3.87–5.11)
RDW: 12.6 % (ref 11.5–15.5)
WBC: 11 10*3/uL — ABNORMAL HIGH (ref 4.0–10.5)
nRBC: 0 % (ref 0.0–0.2)

## 2023-04-21 LAB — URINALYSIS, ROUTINE W REFLEX MICROSCOPIC
Bilirubin Urine: NEGATIVE
Glucose, UA: NEGATIVE mg/dL
Hgb urine dipstick: NEGATIVE
Ketones, ur: NEGATIVE mg/dL
Leukocytes,Ua: NEGATIVE
Nitrite: NEGATIVE
Protein, ur: NEGATIVE mg/dL
Specific Gravity, Urine: 1.015 (ref 1.005–1.030)
pH: 8.5 — ABNORMAL HIGH (ref 5.0–8.0)

## 2023-04-21 LAB — PREGNANCY, URINE: Preg Test, Ur: NEGATIVE

## 2023-04-21 LAB — LIPASE, BLOOD: Lipase: 28 U/L (ref 11–51)

## 2023-04-21 NOTE — ED Triage Notes (Signed)
 Pt was seen at [facility] for acid reflux, has been off omeprazole x 1 week Has had N/V x 3 that started today and upper abdominal pain as well  Also states constipation, no bm x 3 days  Not eating well  Tolerating water  Also c/o headache x 3 days     H/o cholecystectomy

## 2023-04-21 NOTE — ED Notes (Signed)
 Per registration pt left

## 2023-06-19 ENCOUNTER — Ambulatory Visit: Admitting: Infectious Diseases

## 2023-06-24 NOTE — Progress Notes (Signed)
 The 10-year ASCVD risk score (Arnett DK, et al., 2019) is: 0.7%   Values used to calculate the score:     Age: 43 years     Sex: Female     Is Non-Hispanic African American: No     Diabetic: No     Tobacco smoker: No     Systolic Blood Pressure: 113 mmHg     Is BP treated: No     HDL Cholesterol: 56 mg/dL     Total Cholesterol: 230 mg/dL  Arlon Bergamo, BSN, RN

## 2023-07-13 ENCOUNTER — Ambulatory Visit: Admitting: Infectious Diseases

## 2023-08-11 ENCOUNTER — Ambulatory Visit: Admitting: Infectious Diseases

## 2023-08-11 ENCOUNTER — Other Ambulatory Visit (HOSPITAL_COMMUNITY)
Admission: RE | Admit: 2023-08-11 | Discharge: 2023-08-11 | Disposition: A | Discharge status: Home / Self Care | Source: Ambulatory Visit | Attending: Infectious Diseases | Admitting: Infectious Diseases

## 2023-08-11 ENCOUNTER — Other Ambulatory Visit: Payer: Self-pay

## 2023-08-11 ENCOUNTER — Encounter: Payer: Self-pay | Admitting: Infectious Diseases

## 2023-08-11 VITALS — BP 111/78 | HR 114 | Temp 97.5°F | Ht 60.0 in | Wt 168.0 lb

## 2023-08-11 DIAGNOSIS — B20 Human immunodeficiency virus [HIV] disease: Secondary | ICD-10-CM | POA: Diagnosis not present

## 2023-08-11 DIAGNOSIS — Z8619 Personal history of other infectious and parasitic diseases: Secondary | ICD-10-CM

## 2023-08-11 DIAGNOSIS — B009 Herpesviral infection, unspecified: Secondary | ICD-10-CM

## 2023-08-11 DIAGNOSIS — Z1231 Encounter for screening mammogram for malignant neoplasm of breast: Secondary | ICD-10-CM

## 2023-08-11 DIAGNOSIS — Z113 Encounter for screening for infections with a predominantly sexual mode of transmission: Secondary | ICD-10-CM

## 2023-08-11 DIAGNOSIS — N9089 Other specified noninflammatory disorders of vulva and perineum: Secondary | ICD-10-CM

## 2023-08-11 MED ORDER — VALACYCLOVIR HCL 500 MG PO TABS: ORAL_TABLET | ORAL | 6 refills | Status: AC
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
## 2023-08-11 NOTE — Patient Instructions (Addendum)
   [facility] -  Address: [address] [place], [place] [address] Phone: [phone] *call to leave your information on the INTAKE line.  *case management services    Please call the [facility] to set up your mammogram.  [phone] [address] [place], [place] [address]

## 2023-08-12 ENCOUNTER — Ambulatory Visit: Payer: Self-pay | Admitting: Infectious Diseases

## 2023-08-12 LAB — URINE CYTOLOGY ANCILLARY ONLY
Chlamydia: NEGATIVE
Comment: NEGATIVE
Comment: NORMAL
Neisseria Gonorrhea: NEGATIVE

## 2023-08-12 LAB — CYTOLOGY, (ORAL, ANAL, URETHRAL) ANCILLARY ONLY
Chlamydia: NEGATIVE
Comment: NEGATIVE
Comment: NORMAL
Neisseria Gonorrhea: NEGATIVE

## 2023-08-12 LAB — T-HELPER CELLS (CD4) COUNT (NOT AT ARMC)
CD4 % Helper T Cell: 52 % (ref 33–65)
CD4 T Cell Abs: 853 /uL (ref 400–1790)

## 2023-08-13 LAB — HIV-1 RNA QUANT-NO REFLEX-BLD
HIV 1 RNA Quant: NOT DETECTED {copies}/mL
HIV-1 RNA Quant, Log: NOT DETECTED {Log_copies}/mL

## 2023-08-13 LAB — RPR: RPR Ser Ql: NONREACTIVE

## 2023-09-09 ENCOUNTER — Ambulatory Visit: Admitting: Internal Medicine

## 2023-09-09 ENCOUNTER — Ambulatory Visit: Payer: Medicare HMO | Admitting: Internal Medicine

## 2023-09-30 ENCOUNTER — Other Ambulatory Visit: Payer: Self-pay

## 2023-09-30 ENCOUNTER — Encounter: Payer: Self-pay | Admitting: Infectious Diseases

## 2023-09-30 ENCOUNTER — Other Ambulatory Visit (HOSPITAL_COMMUNITY)
Admission: RE | Admit: 2023-09-30 | Discharge: 2023-09-30 | Disposition: A | Discharge status: Home / Self Care | Source: Ambulatory Visit | Attending: Infectious Diseases | Admitting: Infectious Diseases

## 2023-09-30 ENCOUNTER — Ambulatory Visit (INDEPENDENT_AMBULATORY_CARE_PROVIDER_SITE_OTHER): Admitting: Infectious Diseases

## 2023-09-30 VITALS — BP 108/79 | HR 92 | Temp 97.4°F | Wt 168.0 lb

## 2023-09-30 DIAGNOSIS — B20 Human immunodeficiency virus [HIV] disease: Secondary | ICD-10-CM | POA: Diagnosis not present

## 2023-09-30 DIAGNOSIS — R87612 Low grade squamous intraepithelial lesion on cytologic smear of cervix (LGSIL): Secondary | ICD-10-CM | POA: Diagnosis not present

## 2023-09-30 DIAGNOSIS — Z124 Encounter for screening for malignant neoplasm of cervix: Secondary | ICD-10-CM

## 2023-09-30 DIAGNOSIS — Z113 Encounter for screening for infections with a predominantly sexual mode of transmission: Secondary | ICD-10-CM | POA: Insufficient documentation

## 2023-09-30 DIAGNOSIS — Z1231 Encounter for screening mammogram for malignant neoplasm of breast: Secondary | ICD-10-CM | POA: Diagnosis not present

## 2023-09-30 NOTE — Assessment & Plan Note (Signed)
F/U in 4 months

## 2023-09-30 NOTE — Patient Instructions (Addendum)
 Will let you know what your results show   Please call the [facility] to set up your mammogram.  [phone] [address], [address] [place], [place] [address]
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
## 2023-09-30 NOTE — Assessment & Plan Note (Signed)
 Pap smear updated today. Exam normal. Collected for routine Sti screening from vaginal fluid. No cervicitis/vaginitis.  IUD strings visualized  May need referral to GYN which we can help her with pending study results.

## 2023-10-01 LAB — CERVICOVAGINAL ANCILLARY ONLY
Chlamydia: NEGATIVE
Comment: NEGATIVE
Comment: NEGATIVE
Comment: NORMAL
Neisseria Gonorrhea: NEGATIVE
Trichomonas: NEGATIVE

## 2023-10-02 LAB — CYTOLOGY - PAP
Adequacy: ABSENT
Diagnosis: NEGATIVE

## 2023-10-06 ENCOUNTER — Ambulatory Visit: Payer: Self-pay | Admitting: Infectious Diseases

## 2023-10-19 ENCOUNTER — Other Ambulatory Visit: Payer: Self-pay | Admitting: Internal Medicine

## 2023-10-19 DIAGNOSIS — B20 Human immunodeficiency virus [HIV] disease: Secondary | ICD-10-CM

## 2023-12-10 ENCOUNTER — Other Ambulatory Visit (HOSPITAL_COMMUNITY): Payer: Self-pay

## 2023-12-29 ENCOUNTER — Ambulatory Visit
Admission: RE | Admit: 2023-12-29 | Discharge: 2023-12-29 | Disposition: A | Source: Ambulatory Visit | Attending: Nurse Practitioner

## 2023-12-29 ENCOUNTER — Other Ambulatory Visit: Payer: Self-pay | Admitting: Nurse Practitioner

## 2023-12-29 DIAGNOSIS — M25572 Pain in left ankle and joints of left foot: Secondary | ICD-10-CM
